# Patient Record
Sex: Female | Born: 1935 | Race: White | Hispanic: No | Smoking: Never smoker
Health system: Southern US, Community
[De-identification: ages and names within clinical notes are randomized; demographics above are authoritative.]

## PROBLEM LIST (undated history)

## (undated) DIAGNOSIS — F329 Major depressive disorder, single episode, unspecified: Principal | ICD-10-CM

## (undated) DIAGNOSIS — M810 Age-related osteoporosis without current pathological fracture: Secondary | ICD-10-CM

## (undated) DIAGNOSIS — J45909 Unspecified asthma, uncomplicated: Secondary | ICD-10-CM

## (undated) DIAGNOSIS — K8020 Calculus of gallbladder without cholecystitis without obstruction: Secondary | ICD-10-CM

## (undated) DIAGNOSIS — Z8781 Personal history of (healed) traumatic fracture: Secondary | ICD-10-CM

## (undated) DIAGNOSIS — I509 Heart failure, unspecified: Secondary | ICD-10-CM

## (undated) DIAGNOSIS — Z860100 Personal history of colon polyps, unspecified: Secondary | ICD-10-CM

## (undated) DIAGNOSIS — E785 Hyperlipidemia, unspecified: Secondary | ICD-10-CM

## (undated) DIAGNOSIS — Z8719 Personal history of other diseases of the digestive system: Secondary | ICD-10-CM

## (undated) DIAGNOSIS — I10 Essential (primary) hypertension: Secondary | ICD-10-CM

## (undated) DIAGNOSIS — A0472 Enterocolitis due to Clostridium difficile, not specified as recurrent: Secondary | ICD-10-CM

## (undated) DIAGNOSIS — N200 Calculus of kidney: Secondary | ICD-10-CM

## (undated) DIAGNOSIS — Z87442 Personal history of urinary calculi: Secondary | ICD-10-CM

## (undated) DIAGNOSIS — Q613 Polycystic kidney, unspecified: Secondary | ICD-10-CM

## (undated) DIAGNOSIS — F411 Generalized anxiety disorder: Secondary | ICD-10-CM

## (undated) DIAGNOSIS — S329XXA Fracture of unspecified parts of lumbosacral spine and pelvis, initial encounter for closed fracture: Secondary | ICD-10-CM

## (undated) DIAGNOSIS — I4821 Permanent atrial fibrillation: Secondary | ICD-10-CM

## (undated) DIAGNOSIS — I340 Nonrheumatic mitral (valve) insufficiency: Secondary | ICD-10-CM

## (undated) DIAGNOSIS — I350 Nonrheumatic aortic (valve) stenosis: Secondary | ICD-10-CM

## (undated) DIAGNOSIS — J309 Allergic rhinitis, unspecified: Secondary | ICD-10-CM

## (undated) DIAGNOSIS — M1990 Unspecified osteoarthritis, unspecified site: Secondary | ICD-10-CM

## (undated) HISTORY — DX: Generalized anxiety disorder: F41.1

## (undated) HISTORY — DX: Enterocolitis due to Clostridium difficile, not specified as recurrent: A04.72

## (undated) HISTORY — PX: APPENDECTOMY: SHX54

## (undated) HISTORY — DX: Personal history of colonic polyps: Z86.010

## (undated) HISTORY — DX: Nonrheumatic aortic (valve) stenosis: I35.0

## (undated) HISTORY — DX: Essential (primary) hypertension: I10

## (undated) HISTORY — PX: HEMICOLECTOMY: SHX854

## (undated) HISTORY — DX: Unspecified osteoarthritis, unspecified site: M19.90

## (undated) HISTORY — DX: Allergic rhinitis, unspecified: J30.9

## (undated) HISTORY — DX: Age-related osteoporosis without current pathological fracture: M81.0

## (undated) HISTORY — DX: Hyperlipidemia, unspecified: E78.5

## (undated) HISTORY — DX: Major depressive disorder, single episode, unspecified: F32.9

## (undated) HISTORY — DX: Personal history of colon polyps, unspecified: Z86.0100

## (undated) HISTORY — DX: Unspecified asthma, uncomplicated: J45.909

## (undated) HISTORY — DX: Personal history of urinary calculi: Z87.442

## (undated) HISTORY — PX: TONSILLECTOMY: SUR1361

## (undated) HISTORY — DX: Personal history of other diseases of the digestive system: Z87.19

---

## 1997-09-16 ENCOUNTER — Other Ambulatory Visit: Admission: RE | Admit: 1997-09-16 | Discharge: 1997-09-16 | Payer: Self-pay | Admitting: Obstetrics and Gynecology

## 1998-09-16 ENCOUNTER — Other Ambulatory Visit: Admission: RE | Admit: 1998-09-16 | Discharge: 1998-09-16 | Payer: Self-pay | Admitting: Obstetrics and Gynecology

## 1999-02-02 ENCOUNTER — Ambulatory Visit (HOSPITAL_COMMUNITY): Admission: RE | Admit: 1999-02-02 | Discharge: 1999-02-02 | Payer: Self-pay | Admitting: Urology

## 1999-02-02 ENCOUNTER — Encounter: Payer: Self-pay | Admitting: Urology

## 1999-02-07 ENCOUNTER — Encounter: Payer: Self-pay | Admitting: Urology

## 1999-02-09 ENCOUNTER — Encounter: Payer: Self-pay | Admitting: Urology

## 1999-02-09 ENCOUNTER — Inpatient Hospital Stay (HOSPITAL_COMMUNITY): Admission: RE | Admit: 1999-02-09 | Discharge: 1999-02-13 | Payer: Self-pay | Admitting: Urology

## 1999-02-10 ENCOUNTER — Encounter: Payer: Self-pay | Admitting: Urology

## 1999-02-11 ENCOUNTER — Encounter: Payer: Self-pay | Admitting: Urology

## 1999-02-12 ENCOUNTER — Encounter: Payer: Self-pay | Admitting: Urology

## 1999-04-21 ENCOUNTER — Ambulatory Visit (HOSPITAL_COMMUNITY): Admission: RE | Admit: 1999-04-21 | Discharge: 1999-04-21 | Payer: Self-pay | Admitting: Urology

## 1999-04-21 ENCOUNTER — Encounter: Payer: Self-pay | Admitting: Urology

## 1999-06-21 ENCOUNTER — Ambulatory Visit (HOSPITAL_BASED_OUTPATIENT_CLINIC_OR_DEPARTMENT_OTHER): Admission: RE | Admit: 1999-06-21 | Discharge: 1999-06-21 | Payer: Self-pay | Admitting: Orthopaedic Surgery

## 1999-06-21 ENCOUNTER — Encounter (INDEPENDENT_AMBULATORY_CARE_PROVIDER_SITE_OTHER): Payer: Self-pay | Admitting: *Deleted

## 1999-09-21 ENCOUNTER — Encounter: Admission: RE | Admit: 1999-09-21 | Discharge: 1999-09-21 | Payer: Self-pay | Admitting: Family Medicine

## 1999-09-21 ENCOUNTER — Encounter: Payer: Self-pay | Admitting: Family Medicine

## 1999-10-07 ENCOUNTER — Other Ambulatory Visit: Admission: RE | Admit: 1999-10-07 | Discharge: 1999-10-07 | Payer: Self-pay | Admitting: Family Medicine

## 1999-12-09 ENCOUNTER — Encounter: Payer: Self-pay | Admitting: Urology

## 1999-12-09 ENCOUNTER — Encounter: Admission: RE | Admit: 1999-12-09 | Discharge: 1999-12-09 | Payer: Self-pay | Admitting: Urology

## 2000-06-06 ENCOUNTER — Encounter: Payer: Self-pay | Admitting: Urology

## 2000-06-06 ENCOUNTER — Ambulatory Visit (HOSPITAL_COMMUNITY): Admission: RE | Admit: 2000-06-06 | Discharge: 2000-06-06 | Payer: Self-pay | Admitting: Urology

## 2000-06-07 ENCOUNTER — Ambulatory Visit (HOSPITAL_COMMUNITY): Admission: RE | Admit: 2000-06-07 | Discharge: 2000-06-07 | Payer: Self-pay | Admitting: Urology

## 2000-06-07 ENCOUNTER — Encounter: Payer: Self-pay | Admitting: Urology

## 2000-06-29 ENCOUNTER — Encounter: Admission: RE | Admit: 2000-06-29 | Discharge: 2000-06-29 | Payer: Self-pay | Admitting: Urology

## 2000-06-29 ENCOUNTER — Encounter: Payer: Self-pay | Admitting: Urology

## 2000-07-19 ENCOUNTER — Encounter (INDEPENDENT_AMBULATORY_CARE_PROVIDER_SITE_OTHER): Payer: Self-pay | Admitting: *Deleted

## 2000-07-19 ENCOUNTER — Ambulatory Visit (HOSPITAL_BASED_OUTPATIENT_CLINIC_OR_DEPARTMENT_OTHER): Admission: RE | Admit: 2000-07-19 | Discharge: 2000-07-19 | Payer: Self-pay | Admitting: Orthopaedic Surgery

## 2000-09-21 ENCOUNTER — Encounter: Admission: RE | Admit: 2000-09-21 | Discharge: 2000-09-21 | Payer: Self-pay | Admitting: *Deleted

## 2000-11-21 ENCOUNTER — Encounter: Payer: Self-pay | Admitting: Urology

## 2000-11-21 ENCOUNTER — Encounter: Admission: RE | Admit: 2000-11-21 | Discharge: 2000-11-21 | Payer: Self-pay | Admitting: Urology

## 2001-02-11 ENCOUNTER — Ambulatory Visit (HOSPITAL_COMMUNITY): Admission: RE | Admit: 2001-02-11 | Discharge: 2001-02-11 | Payer: Self-pay | Admitting: Cardiovascular Disease

## 2001-02-11 ENCOUNTER — Encounter: Payer: Self-pay | Admitting: Cardiovascular Disease

## 2001-04-01 ENCOUNTER — Encounter: Payer: Self-pay | Admitting: Urology

## 2001-04-01 ENCOUNTER — Ambulatory Visit (HOSPITAL_COMMUNITY): Admission: RE | Admit: 2001-04-01 | Discharge: 2001-04-01 | Payer: Self-pay | Admitting: Urology

## 2001-04-16 ENCOUNTER — Encounter: Admission: RE | Admit: 2001-04-16 | Discharge: 2001-04-16 | Payer: Self-pay | Admitting: Urology

## 2001-04-16 ENCOUNTER — Encounter: Payer: Self-pay | Admitting: Urology

## 2001-09-27 ENCOUNTER — Encounter: Admission: RE | Admit: 2001-09-27 | Discharge: 2001-09-27 | Payer: Self-pay | Admitting: Internal Medicine

## 2001-09-27 ENCOUNTER — Encounter: Payer: Self-pay | Admitting: Internal Medicine

## 2001-10-08 ENCOUNTER — Other Ambulatory Visit: Admission: RE | Admit: 2001-10-08 | Discharge: 2001-10-08 | Payer: Self-pay | Admitting: Obstetrics and Gynecology

## 2001-10-21 ENCOUNTER — Encounter: Payer: Self-pay | Admitting: Urology

## 2001-10-21 ENCOUNTER — Encounter: Admission: RE | Admit: 2001-10-21 | Discharge: 2001-10-21 | Payer: Self-pay | Admitting: Urology

## 2001-10-28 ENCOUNTER — Encounter: Payer: Self-pay | Admitting: Gastroenterology

## 2002-04-28 ENCOUNTER — Encounter: Payer: Self-pay | Admitting: Urology

## 2002-04-28 ENCOUNTER — Encounter: Admission: RE | Admit: 2002-04-28 | Discharge: 2002-04-28 | Payer: Self-pay | Admitting: Urology

## 2002-08-24 ENCOUNTER — Encounter: Payer: Self-pay | Admitting: Emergency Medicine

## 2002-08-24 ENCOUNTER — Emergency Department (HOSPITAL_COMMUNITY): Admission: EM | Admit: 2002-08-24 | Discharge: 2002-08-24 | Payer: Self-pay | Admitting: Emergency Medicine

## 2002-11-24 ENCOUNTER — Other Ambulatory Visit: Admission: RE | Admit: 2002-11-24 | Discharge: 2002-11-24 | Payer: Self-pay | Admitting: Obstetrics and Gynecology

## 2002-12-03 ENCOUNTER — Ambulatory Visit (HOSPITAL_COMMUNITY): Admission: RE | Admit: 2002-12-03 | Discharge: 2002-12-03 | Payer: Self-pay | Admitting: Cardiology

## 2003-06-22 ENCOUNTER — Inpatient Hospital Stay (HOSPITAL_COMMUNITY): Admission: EM | Admit: 2003-06-22 | Discharge: 2003-06-23 | Payer: Self-pay | Admitting: Urology

## 2003-06-22 ENCOUNTER — Emergency Department (HOSPITAL_COMMUNITY): Admission: EM | Admit: 2003-06-22 | Discharge: 2003-06-22 | Payer: Self-pay | Admitting: Emergency Medicine

## 2004-10-13 ENCOUNTER — Ambulatory Visit: Payer: Self-pay | Admitting: Internal Medicine

## 2004-10-15 ENCOUNTER — Ambulatory Visit: Payer: Self-pay | Admitting: Internal Medicine

## 2004-12-23 ENCOUNTER — Ambulatory Visit: Payer: Self-pay | Admitting: Internal Medicine

## 2004-12-26 ENCOUNTER — Other Ambulatory Visit: Admission: RE | Admit: 2004-12-26 | Discharge: 2004-12-26 | Payer: Self-pay | Admitting: Obstetrics and Gynecology

## 2005-01-27 ENCOUNTER — Encounter: Admission: RE | Admit: 2005-01-27 | Discharge: 2005-01-27 | Payer: Self-pay | Admitting: Orthopaedic Surgery

## 2005-02-16 ENCOUNTER — Ambulatory Visit: Payer: Self-pay | Admitting: Internal Medicine

## 2005-04-21 ENCOUNTER — Ambulatory Visit: Payer: Self-pay | Admitting: Internal Medicine

## 2005-09-04 ENCOUNTER — Ambulatory Visit: Payer: Self-pay | Admitting: Internal Medicine

## 2005-10-20 ENCOUNTER — Ambulatory Visit: Payer: Self-pay | Admitting: Internal Medicine

## 2005-11-07 ENCOUNTER — Ambulatory Visit: Payer: Self-pay | Admitting: Internal Medicine

## 2005-11-10 ENCOUNTER — Ambulatory Visit: Payer: Self-pay | Admitting: Internal Medicine

## 2006-01-09 ENCOUNTER — Ambulatory Visit: Payer: Self-pay | Admitting: Internal Medicine

## 2006-02-02 ENCOUNTER — Encounter: Admission: RE | Admit: 2006-02-02 | Discharge: 2006-02-02 | Payer: Self-pay | Admitting: Obstetrics and Gynecology

## 2006-03-30 ENCOUNTER — Ambulatory Visit: Payer: Self-pay | Admitting: Internal Medicine

## 2006-08-20 ENCOUNTER — Ambulatory Visit: Payer: Self-pay | Admitting: Internal Medicine

## 2006-08-21 DIAGNOSIS — Z8719 Personal history of other diseases of the digestive system: Secondary | ICD-10-CM | POA: Insufficient documentation

## 2006-08-21 DIAGNOSIS — I10 Essential (primary) hypertension: Secondary | ICD-10-CM | POA: Insufficient documentation

## 2006-08-21 DIAGNOSIS — M1990 Unspecified osteoarthritis, unspecified site: Secondary | ICD-10-CM | POA: Insufficient documentation

## 2006-08-21 DIAGNOSIS — F411 Generalized anxiety disorder: Secondary | ICD-10-CM | POA: Insufficient documentation

## 2006-08-21 DIAGNOSIS — E785 Hyperlipidemia, unspecified: Secondary | ICD-10-CM | POA: Insufficient documentation

## 2006-08-21 DIAGNOSIS — M810 Age-related osteoporosis without current pathological fracture: Secondary | ICD-10-CM | POA: Insufficient documentation

## 2006-08-21 DIAGNOSIS — Z87442 Personal history of urinary calculi: Secondary | ICD-10-CM | POA: Insufficient documentation

## 2006-11-13 ENCOUNTER — Ambulatory Visit: Payer: Self-pay | Admitting: Gastroenterology

## 2006-11-13 LAB — CONVERTED CEMR LAB
Basophils Absolute: 0 10*3/uL (ref 0.0–0.1)
Basophils Relative: 0.8 % (ref 0.0–1.0)
Eosinophils Absolute: 0.1 10*3/uL (ref 0.0–0.6)
Eosinophils Relative: 1.5 % (ref 0.0–5.0)
HCT: 40.1 % (ref 36.0–46.0)
Hemoglobin: 13.9 g/dL (ref 12.0–15.0)
Iron: 97 ug/dL (ref 42–145)
Lymphocytes Relative: 27.2 % (ref 12.0–46.0)
MCHC: 34.7 g/dL (ref 30.0–36.0)
MCV: 88.5 fL (ref 78.0–100.0)
Monocytes Absolute: 0.3 10*3/uL (ref 0.2–0.7)
Monocytes Relative: 5.8 % (ref 3.0–11.0)
Neutro Abs: 3.2 10*3/uL (ref 1.4–7.7)
Neutrophils Relative %: 64.7 % (ref 43.0–77.0)
Platelets: 125 10*3/uL — ABNORMAL LOW (ref 150–400)
RBC: 4.53 M/uL (ref 3.87–5.11)
RDW: 13.4 % (ref 11.5–14.6)
Saturation Ratios: 23.2 % (ref 20.0–50.0)
Transferrin: 298.7 mg/dL (ref >=212.0)
WBC: 4.9 10*3/uL (ref 4.5–10.5)

## 2006-11-23 ENCOUNTER — Ambulatory Visit: Payer: Self-pay | Admitting: Internal Medicine

## 2006-11-27 ENCOUNTER — Ambulatory Visit: Payer: Self-pay | Admitting: Gastroenterology

## 2006-11-27 LAB — CONVERTED CEMR LAB
Fecal Occult Blood: NEGATIVE
OCCULT 1: POSITIVE — AB
OCCULT 2: POSITIVE — AB
OCCULT 3: NEGATIVE
OCCULT 4: NEGATIVE
OCCULT 5: NEGATIVE

## 2007-01-25 ENCOUNTER — Encounter: Payer: Self-pay | Admitting: Internal Medicine

## 2007-01-31 ENCOUNTER — Ambulatory Visit: Payer: Self-pay | Admitting: Internal Medicine

## 2007-01-31 LAB — CONVERTED CEMR LAB
Bilirubin Urine: NEGATIVE
Glucose, Urine, Semiquant: NEGATIVE
Ketones, urine, test strip: NEGATIVE
Nitrite: NEGATIVE
Protein, U semiquant: NEGATIVE
Specific Gravity, Urine: 1.01
Urobilinogen, UA: NEGATIVE
pH: 5

## 2007-02-07 ENCOUNTER — Telehealth: Payer: Self-pay | Admitting: Internal Medicine

## 2007-02-25 ENCOUNTER — Encounter: Payer: Self-pay | Admitting: Internal Medicine

## 2007-04-16 ENCOUNTER — Encounter (INDEPENDENT_AMBULATORY_CARE_PROVIDER_SITE_OTHER): Payer: Self-pay | Admitting: *Deleted

## 2007-04-16 ENCOUNTER — Telehealth: Payer: Self-pay | Admitting: Internal Medicine

## 2007-04-18 ENCOUNTER — Telehealth (INDEPENDENT_AMBULATORY_CARE_PROVIDER_SITE_OTHER): Payer: Self-pay | Admitting: *Deleted

## 2007-07-16 ENCOUNTER — Encounter: Payer: Self-pay | Admitting: Internal Medicine

## 2007-07-25 ENCOUNTER — Telehealth (INDEPENDENT_AMBULATORY_CARE_PROVIDER_SITE_OTHER): Payer: Self-pay | Admitting: *Deleted

## 2007-08-09 ENCOUNTER — Ambulatory Visit: Payer: Self-pay | Admitting: Internal Medicine

## 2007-08-09 ENCOUNTER — Telehealth (INDEPENDENT_AMBULATORY_CARE_PROVIDER_SITE_OTHER): Payer: Self-pay | Admitting: *Deleted

## 2007-08-09 DIAGNOSIS — I4821 Permanent atrial fibrillation: Secondary | ICD-10-CM | POA: Insufficient documentation

## 2007-08-09 DIAGNOSIS — J302 Other seasonal allergic rhinitis: Secondary | ICD-10-CM | POA: Insufficient documentation

## 2007-08-09 DIAGNOSIS — J45909 Unspecified asthma, uncomplicated: Secondary | ICD-10-CM

## 2007-08-09 DIAGNOSIS — J3089 Other allergic rhinitis: Secondary | ICD-10-CM

## 2007-10-30 ENCOUNTER — Telehealth: Payer: Self-pay | Admitting: Internal Medicine

## 2007-10-31 ENCOUNTER — Ambulatory Visit: Payer: Self-pay | Admitting: Internal Medicine

## 2007-10-31 DIAGNOSIS — J069 Acute upper respiratory infection, unspecified: Secondary | ICD-10-CM | POA: Insufficient documentation

## 2007-11-06 ENCOUNTER — Ambulatory Visit: Payer: Self-pay | Admitting: Internal Medicine

## 2007-11-28 ENCOUNTER — Telehealth: Payer: Self-pay | Admitting: Internal Medicine

## 2007-11-29 ENCOUNTER — Ambulatory Visit: Payer: Self-pay | Admitting: Internal Medicine

## 2007-11-29 ENCOUNTER — Telehealth: Payer: Self-pay | Admitting: Internal Medicine

## 2008-01-21 ENCOUNTER — Encounter: Payer: Self-pay | Admitting: Internal Medicine

## 2008-03-12 ENCOUNTER — Telehealth (INDEPENDENT_AMBULATORY_CARE_PROVIDER_SITE_OTHER): Payer: Self-pay | Admitting: *Deleted

## 2008-03-16 ENCOUNTER — Ambulatory Visit: Payer: Self-pay | Admitting: Internal Medicine

## 2008-03-19 ENCOUNTER — Telehealth (INDEPENDENT_AMBULATORY_CARE_PROVIDER_SITE_OTHER): Payer: Self-pay | Admitting: *Deleted

## 2008-05-29 ENCOUNTER — Emergency Department (HOSPITAL_BASED_OUTPATIENT_CLINIC_OR_DEPARTMENT_OTHER): Admission: EM | Admit: 2008-05-29 | Discharge: 2008-05-29 | Payer: Self-pay | Admitting: Emergency Medicine

## 2008-06-12 ENCOUNTER — Encounter: Payer: Self-pay | Admitting: Internal Medicine

## 2008-07-15 DIAGNOSIS — Z860100 Personal history of colon polyps, unspecified: Secondary | ICD-10-CM | POA: Insufficient documentation

## 2008-07-16 ENCOUNTER — Ambulatory Visit: Payer: Self-pay | Admitting: Gastroenterology

## 2008-07-16 DIAGNOSIS — K625 Hemorrhage of anus and rectum: Secondary | ICD-10-CM | POA: Insufficient documentation

## 2008-07-16 DIAGNOSIS — K5730 Diverticulosis of large intestine without perforation or abscess without bleeding: Secondary | ICD-10-CM | POA: Insufficient documentation

## 2008-07-16 DIAGNOSIS — D689 Coagulation defect, unspecified: Secondary | ICD-10-CM

## 2008-07-16 DIAGNOSIS — K644 Residual hemorrhoidal skin tags: Secondary | ICD-10-CM | POA: Insufficient documentation

## 2008-07-16 LAB — CONVERTED CEMR LAB
Basophils Absolute: 0 10*3/uL (ref 0.0–0.1)
Basophils Relative: 0.6 % (ref 0.0–3.0)
Eosinophils Absolute: 0.1 10*3/uL (ref 0.0–0.7)
Eosinophils Relative: 1.7 % (ref 0.0–5.0)
Ferritin: 30.9 ng/mL (ref 10.0–291.0)
Folate: >20 ng/mL
HCT: 40.1 % (ref 36.0–46.0)
Hemoglobin: 13.8 g/dL (ref 12.0–15.0)
Iron: 73 ug/dL (ref 42–145)
Lymphocytes Relative: 31 % (ref 12.0–46.0)
MCHC: 34.4 g/dL (ref 30.0–36.0)
MCV: 90.3 fL (ref 78.0–100.0)
Monocytes Absolute: 0.5 10*3/uL (ref 0.1–1.0)
Monocytes Relative: 7.7 % (ref 3.0–12.0)
Neutro Abs: 4.3 10*3/uL (ref 1.4–7.7)
Neutrophils Relative %: 59 % (ref 43.0–77.0)
Platelets: 147 10*3/uL — ABNORMAL LOW (ref 150–400)
RBC: 4.44 M/uL (ref 3.87–5.11)
RDW: 12.4 % (ref 11.5–14.6)
Saturation Ratios: 15.8 % — ABNORMAL LOW (ref 20.0–50.0)
Transferrin: 329.5 mg/dL (ref >=212.0)
Vitamin B-12: 448 pg/mL (ref 211–911)
WBC: 7.1 10*3/uL (ref 4.5–10.5)

## 2008-07-22 ENCOUNTER — Encounter: Payer: Self-pay | Admitting: Gastroenterology

## 2008-07-22 ENCOUNTER — Ambulatory Visit: Payer: Self-pay | Admitting: Gastroenterology

## 2008-07-23 ENCOUNTER — Encounter: Payer: Self-pay | Admitting: Gastroenterology

## 2008-07-31 ENCOUNTER — Emergency Department (HOSPITAL_BASED_OUTPATIENT_CLINIC_OR_DEPARTMENT_OTHER): Admission: EM | Admit: 2008-07-31 | Discharge: 2008-07-31 | Payer: Self-pay | Admitting: Emergency Medicine

## 2008-07-31 ENCOUNTER — Ambulatory Visit: Payer: Self-pay | Admitting: Diagnostic Radiology

## 2008-08-01 ENCOUNTER — Emergency Department (HOSPITAL_COMMUNITY): Admission: EM | Admit: 2008-08-01 | Discharge: 2008-08-01 | Payer: Self-pay | Admitting: Emergency Medicine

## 2008-08-11 ENCOUNTER — Ambulatory Visit (HOSPITAL_COMMUNITY): Admission: RE | Admit: 2008-08-11 | Discharge: 2008-08-11 | Payer: Self-pay | Admitting: Urology

## 2008-09-15 ENCOUNTER — Ambulatory Visit: Payer: Self-pay | Admitting: Internal Medicine

## 2008-11-11 ENCOUNTER — Ambulatory Visit: Payer: Self-pay | Admitting: Internal Medicine

## 2008-12-18 ENCOUNTER — Ambulatory Visit: Payer: Self-pay | Admitting: Internal Medicine

## 2009-01-04 ENCOUNTER — Telehealth: Payer: Self-pay | Admitting: Internal Medicine

## 2009-01-05 ENCOUNTER — Ambulatory Visit: Payer: Self-pay | Admitting: Internal Medicine

## 2009-02-16 ENCOUNTER — Ambulatory Visit: Payer: Self-pay | Admitting: Internal Medicine

## 2009-02-19 ENCOUNTER — Ambulatory Visit: Payer: Self-pay | Admitting: Internal Medicine

## 2009-02-19 ENCOUNTER — Telehealth (INDEPENDENT_AMBULATORY_CARE_PROVIDER_SITE_OTHER): Payer: Self-pay | Admitting: *Deleted

## 2009-04-01 ENCOUNTER — Encounter: Payer: Self-pay | Admitting: Internal Medicine

## 2009-05-31 ENCOUNTER — Telehealth: Payer: Self-pay | Admitting: Internal Medicine

## 2009-05-31 ENCOUNTER — Ambulatory Visit: Payer: Self-pay | Admitting: Internal Medicine

## 2009-10-11 ENCOUNTER — Ambulatory Visit: Payer: Self-pay | Admitting: Internal Medicine

## 2009-12-20 ENCOUNTER — Ambulatory Visit: Payer: Self-pay | Admitting: Internal Medicine

## 2009-12-20 ENCOUNTER — Telehealth (INDEPENDENT_AMBULATORY_CARE_PROVIDER_SITE_OTHER): Payer: Self-pay | Admitting: *Deleted

## 2009-12-24 ENCOUNTER — Telehealth: Payer: Self-pay | Admitting: Internal Medicine

## 2010-01-14 ENCOUNTER — Ambulatory Visit: Payer: Self-pay | Admitting: Gastroenterology

## 2010-01-14 LAB — CONVERTED CEMR LAB
Basophils Absolute: 0 10*3/uL (ref 0.0–0.1)
Basophils Relative: 0.3 % (ref 0.0–3.0)
Eosinophils Absolute: 0.1 10*3/uL (ref 0.0–0.7)
Eosinophils Relative: 1.1 % (ref 0.0–5.0)
Ferritin: 42.2 ng/mL (ref 10.0–291.0)
Folate: >20 ng/mL
HCT: 42.2 % (ref 36.0–46.0)
Hemoglobin: 14.3 g/dL (ref 12.0–15.0)
Iron: 138 ug/dL (ref 42–145)
Lymphocytes Relative: 22.6 % (ref 12.0–46.0)
Lymphs Abs: 1.8 10*3/uL (ref 0.7–4.0)
MCHC: 33.8 g/dL (ref 30.0–36.0)
MCV: 90.5 fL (ref 78.0–100.0)
Monocytes Absolute: 0.5 10*3/uL (ref 0.1–1.0)
Monocytes Relative: 6.6 % (ref 3.0–12.0)
Neutro Abs: 5.6 10*3/uL (ref 1.4–7.7)
Neutrophils Relative %: 69.4 % (ref 43.0–77.0)
Platelets: 125 10*3/uL — ABNORMAL LOW (ref 150.0–400.0)
RBC: 4.66 M/uL (ref 3.87–5.11)
RDW: 16 % — ABNORMAL HIGH (ref 11.5–14.6)
Saturation Ratios: 30.7 % (ref 20.0–50.0)
Transferrin: 320.8 mg/dL (ref 212.0–360.0)
Vitamin B-12: 285 pg/mL (ref 211–911)
WBC: 8.1 10*3/uL (ref 4.5–10.5)

## 2010-04-19 ENCOUNTER — Telehealth (INDEPENDENT_AMBULATORY_CARE_PROVIDER_SITE_OTHER): Payer: Self-pay | Admitting: *Deleted

## 2010-04-19 ENCOUNTER — Ambulatory Visit: Payer: Self-pay | Admitting: Internal Medicine

## 2010-06-13 ENCOUNTER — Telehealth: Payer: Self-pay | Admitting: Internal Medicine

## 2010-06-17 ENCOUNTER — Telehealth (INDEPENDENT_AMBULATORY_CARE_PROVIDER_SITE_OTHER): Payer: Self-pay | Admitting: *Deleted

## 2010-07-01 ENCOUNTER — Telehealth (INDEPENDENT_AMBULATORY_CARE_PROVIDER_SITE_OTHER): Payer: Self-pay | Admitting: *Deleted

## 2010-07-04 ENCOUNTER — Ambulatory Visit: Payer: Self-pay | Admitting: Internal Medicine

## 2010-07-04 ENCOUNTER — Telehealth: Payer: Self-pay | Admitting: Internal Medicine

## 2010-07-04 DIAGNOSIS — J018 Other acute sinusitis: Secondary | ICD-10-CM | POA: Insufficient documentation

## 2010-07-28 ENCOUNTER — Encounter: Payer: Self-pay | Admitting: Internal Medicine

## 2010-07-28 ENCOUNTER — Ambulatory Visit: Payer: Self-pay | Admitting: Internal Medicine

## 2010-09-04 ENCOUNTER — Encounter: Payer: Self-pay | Admitting: Obstetrics and Gynecology

## 2010-09-13 NOTE — Progress Notes (Signed)
Summary: drug interaction- pharm calling  Phone Note From Pharmacy   [facility] [place] Call For: [name]  Summary of Call: kim at [facility] says that they received a flag from pt's ins. co re: drug interaction of the biaxin w/ vytorin. please advise. (they didn't show vytorin on pt's med list at [facility]). call kim, april or joe at [phone] Initial call taken by: Tivis Ringer, CNA,  Dec 24, 2009 12:58 PM  Follow-up for Phone Call        [facility]  He states that there is an interaction between vytorin and biaxin where there is potential for severe muscle breakdown.  Do you want to switch pt to something else.  Please advise, thanks! Follow-up by: Vernie Murders,  Dec 24, 2009 1:12 PM  Additional Follow-up for Phone Call Additional follow up Details #1::        Thanks- change to doxycycline 100 mg, #8  2 today then one daily Additional Follow-up by: Waymon Budge MD,  Dec 24, 2009 1:35 PM    Additional Follow-up for Phone Call Additional follow up Details #2::    pharmacy advised of the change, med list chanegd as well.   New/Updated Medications: DOXYCYCLINE HYCLATE 100 MG TABS (DOXYCYCLINE HYCLATE) 2 today then one daily Prescriptions: DOXYCYCLINE HYCLATE 100 MG TABS (DOXYCYCLINE HYCLATE) 2 today then one daily  #8 x 0   Entered by:   Carron Curie CMA   Authorized by:   Waymon Budge MD   Signed by:   Carron Curie CMA on 12/24/2009   Method used:   Telephoned to ...       [facility]  [address] [phone][facility] ([facility]ail[facility]       [address]       [place], [place]  [address]       Ph: [phone] or [phone]       Fax: [phone]   RxID:   [id number]

## 2010-09-13 NOTE — Progress Notes (Signed)
Summary: cough-give prednisone  Phone Note Call from Patient Call back at Home Phone [phone]   Caller: Patient Call For: young Summary of Call: Pt c/o wheezing, coughing since Saturday, wants to be seen today, pls advise.//[facility] Initial call taken by: Darletta Moll,  June 13, 2010 9:26 AM  Follow-up for Phone Call        wheezing, increased SOB, dry cough x3days.  denies f/c/s.  pt aware [facility] has no openings today and is unable to come [place] d/t daughter having surgery.  pt states that the qvar has not helped her symptoms, for which i explained to the patient that qvar is not meant to be used only as needed but as an everyday maintainence med.  please advise, thanks!  [facility]  ALLERGIES:  1)  ! * Pneumonia Vaccine 2)  ! Prednisone 3)  Penicillin G Potassium (Penicillin G Potassium) 4)  Lipitor (Atorvastatin Calcium) 5)  Sulfa 6)  Morphine Sulfate (Morphine Sulfate) Follow-up by: Boone Master CNA/MA,  June 13, 2010 10:34 AM  Additional Follow-up for Phone Call Additional follow up Details #1::        prednisone 10 mg, # 20 1 tab four times daily x 2 days, 3 times daily x 2 days, 2 times daily x 2 days, 1 time daily x 2 days  Additional Follow-up by: Waymon Budge MD,  June 13, 2010 12:35 PM    Additional Follow-up for Phone Call Additional follow up Details #2::    LMOMTCB regarding Prednisone rx. Abigail Miyamoto RN  June 13, 2010 12:48 PM   pt advised. Carron Curie CMA  June 13, 2010 2:23 PM   New/Updated Medications: PREDNISONE 10 MG TABS (PREDNISONE) 1 tab 4 times a day for 2 days, 1 tab 3 times a day for 2 days, 1 tab 2 times a day, 1 tab 1 time a day for 2 days Prescriptions: PREDNISONE 10 MG TABS (PREDNISONE) 1 tab 4 times a day for 2 days, 1 tab 3 times a day for 2 days, 1 tab 2 times a day, 1 tab 1 time a day for 2 days  #20 x 0   Entered by:   Abigail Miyamoto RN   Authorized by:   Waymon Budge MD   Signed by:   Abigail Miyamoto RN on 06/13/2010   Method used:   Electronically to        [facility]  [address] [phone][facility] (retail)       [address] [place], [place]  [address]       Ph: [phone] or [phone]       Fax: [phone]   RxID:   [id number]

## 2010-09-13 NOTE — Progress Notes (Signed)
Summary: appt  with CY----wheezing/sob  Phone Note Call from Patient Call back at Home Phone [phone]   Caller: Patient Call For: young Reason for Call: Talk to Nurse Summary of Call: pt wheezing day and night, sob - would like to come in for a breathing treatment and a shot.   Initial call taken by: Eugene Gavia,  April 19, 2010 8:19 AM  Follow-up for Phone Call        called and spoke with pt. pt c/o increased wheezing and sob esp at night.  pt requests to be seen today.  pt scheduled to see CY today at 1:30pm.  Arman Filter LPN  April 19, 2010 9:04 AM

## 2010-09-13 NOTE — Progress Notes (Signed)
Summary: speak to nurse  Phone Note Call from Patient Call back at Home Phone [phone]   Caller: Patient Call For: Joanna Salas Summary of Call: Saw CY on 5/9 coughing up green mucous, and still coughing continously, pls advise. [facility] Initial call taken by: Darletta Moll,  Dec 24, 2009 8:40 AM  Follow-up for Phone Call        Spoke with pt.  She was seen on 5/9 for cough and given depo 80 and neb tx.  She states that the cough is no better.  Cough is prod with alot of green sputum. She wants to know if she needs to come back in or get abx called in.  Please advise, thanks allergic to morphine, lipitor, pcn, and sulfa Follow-up by: Vernie Murders,  Dec 24, 2009 8:44 AM  Additional Follow-up for Phone Call Additional follow up Details #1::        Suggest biaxin 500 mg, # 14, 1 two times a day after meals Additional Follow-up by: Waymon Budge MD,  Dec 24, 2009 12:07 PM    Additional Follow-up for Phone Call Additional follow up Details #2::    pt advised. Carron Curie CMA  Dec 24, 2009 12:18 PM   New/Updated Medications: BIAXIN 500 MG TABS (CLARITHROMYCIN) 1 two times a day after meals Prescriptions: BIAXIN 500 MG TABS (CLARITHROMYCIN) 1 two times a day after meals  #14 x 0   Entered by:   Carron Curie CMA   Authorized by:   Waymon Budge MD   Signed by:   Carron Curie CMA on 12/24/2009   Method used:   [facility]        [facility]  [address] [phone]* (retail)       [address] [place], [place]  [address]       Ph: [phone] or [phone]       Fax: [phone]   RxID:   [id number]

## 2010-09-13 NOTE — Progress Notes (Signed)
Summary: cough and congestion  Phone Note Call from Patient   Caller: Patient Call For: young Summary of Call: pt coughing with congestion. would like to be seen today. [facility] Initial call taken by: Rickard Patience,  July 04, 2010 9:06 AM  Follow-up for Phone Call        Spoke withpt and she is c/o still having chest congestion and cough that is worse at night. Pt was given a pred taper on 06-13-10, and then given doxycycline on 07-01-10 but states no improvement and is requesting an OV. Spoke with Florentina Addison and ok to offer 11:15 appt today. Pt aware. I advised this is a work in slot so there might be a slight wait. pt ok with this. Carron Curie CMA  July 04, 2010 9:48 AM

## 2010-09-13 NOTE — Assessment & Plan Note (Signed)
Summary: still congested//[facility]   Copy to:  na Primary Provider/Referring Provider:  Derrek Gu, MD   CC:  Acute visit-still having lots of congestion-nasal drainage with blood in it.Marland Kitchen  History of Present Illness: October 11, 2009- Asthma, rhinitis Acute visit for increasing wheeze. This began after she worked in yard most of day a week ago. Denies sore throat, fever, chest pain. She has only a rescue inhaler. Daughter she wasn't using it right- teaching on MDI technique done.  Dec 20, 2009- Asthma, rhinitis She has been wheezing since she mowed without wearing a mask last week. Denies fever or sore throat. She says prednisone may bother her coumadin, but  it may actually have been an antibiotic that caused problem in past. She is out of rescue inhaler.  April 19, 2010- Asthma, rhinitis We refilled her Proair last visit. She used it only twice since last here. She mowed and stirred up dust, but had begun wheezing the night before. She didn't wear a mask to mow. Today  less wheeze but noticed with exertion.  July 04, 2010- Asthma, rhinitis, AFib Nurse-CC: Acute visit-still having lots of congestion-nasal drainage with blood in it. Acute visit. We called in doxy on 11/18/1. She thinks her problem is sinusitis. Went to [facility] yesterday. CXR there "no pneumonia". They gave albuterol nebw/ little help.  Head congestion, chest congestion, much cough-productive green. No f/c/s.  Cough till retch, but not nauseated. Says prednisone didn't help 4 weeks ago.  Worried about daughter w/ breast cancer.   Asthma History    Asthma Control Assessment:    Age range: 12+ years    Symptoms: >2 days/week    Nighttime Awakenings: 0-2/month    Interferes w/ normal activity: no limitations    SABA use (not for EIB): >2 days/week    Asthma Control Assessment: Not Well Controlled   Preventive Screening-Counseling & Management  Alcohol-Tobacco     Smoking Status: never  Current  Medications (verified): 1)  Verapamil Hcl Cr 240 Mg  Cp24 (Verapamil Hcl) .Marland Kitchen.. 1 By Mouth Once Daily 2)  Evista 60 Mg  Tabs (Raloxifene Hcl) .Marland Kitchen.. 1 By Mouth Once Daily 3)  Coumadin   Tabs (Warfarin Sodium Tabs) .... Per [facility] 4)  Metoprolol Tartrate 50 Mg Tabs (Metoprolol Tartrate) .... Take 1 Tablet By Mouth Two Times A Day 5)  Valturna 300-320 Mg Tabs (Aliskiren-Valsartan) .... Take 1 By Mouth Once Daily 6)  Alprazolam 0.25 Mg Tabs (Alprazolam) .... As Needed 7)  Analpram-Hc 1-2.5 %  Crea (Hydrocortisone Ace-Pramoxine) .... Use 2-3 Time Daily As Needed 8)  Florastor 250 Mg Caps (Saccharomyces Boulardii) .... Take As Needed When On Antiobiotics 9)  Proair Hfa 108 (90 Base) Mcg/act Aers (Albuterol Sulfate) .... 2 Puffs Four Times A Day As Needed Rescue Inhaler. 10)  Qvar 80 Mcg/act Aers (Beclomethasone Dipropionate) .... 2 Puffs and Rinse Mouth, Once Every Day Maintenance Steroid Inhaler 11)  Doxycycline Hyclate 100 Mg Caps (Doxycycline Hyclate) .... 2 Today, Then 1 Once Daily Until Gone  Allergies (verified): 1)  ! * Pneumonia Vaccine 2)  ! Prednisone 3)  Penicillin G Potassium (Penicillin G Potassium) 4)  Lipitor (Atorvastatin Calcium) 5)  Sulfa 6)  Morphine Sulfate (Morphine Sulfate)  Review of Systems      See HPI       The patient complains of shortness of breath with activity, productive cough, irregular heartbeats, and nasal congestion/difficulty breathing through nose.  The patient denies shortness of breath at rest, non-productive cough,  coughing up blood, chest pain, indigestion, loss of appetite, weight change, abdominal pain, difficulty swallowing, sore throat, tooth/dental problems, sneezing, ear ache, and rash.    Vital Signs:  Patient profile:   77 year old female Height:      67 inches Weight:      169 pounds BMI:     26.56 O2 Sat:      92 % on Room air Pulse rate:   95 / minute BP sitting:   140 / 80  (left arm) Cuff size:   regular  Vitals Entered By:  Reynaldo Minium CMA (July 04, 2010 10:58 AM)  O2 Flow:  Room air CC: Acute visit-still having lots of congestion-nasal drainage with blood in it.   Physical Exam  Additional Exam:  General: A/Ox3; pleasant and cooperative, NAD, alert elderly woman SKIN: no rash, lesions NODES: no lymphadenopathy HEENT: [place]/AT, EOM- WNL, Conjuctivae- subconjunctival hemorrhage right,   TM-WNL, Nose- turbinate edema, Throat- clear and wnl, Mallampati  II NECK: Supple w/ fair ROM, JVD- none, normal carotid impulses w/o bruits Thyroid-  CHEST: Slight l wheeze, upper zones with deep breath. HEART: IRR c/w atrial fib, no m/g/r heard ABDOMEN: soft HQI:ONGE, nl pulses, trace  edema  NEURO: Grossly intact to observation      Impression & Recommendations:  Problem # 1:  RHINOSINUSITIS, ACUTE (ICD-461.8)  We will have her finish the doxy, give a standby script for Avelox Neb neo nasal and depo Her updated medication list for this problem includes:    Doxycycline Hyclate 100 Mg Caps (Doxycycline hyclate) .Marland Kitchen... 2 today, then 1 once daily until gone    Avelox Abc Pack 400 Mg Tabs (Moxifloxacin hcl) .Marland Kitchen... 1 daily    Hydromet 5-1.5 Mg/6ml Syrp (Hydrocodone-homatropine) .Marland Kitchen... 1 teaspoon four times a day as needed cough  Problem # 2:  ASTHMA (ICD-493.90) Exacerbation. We reviewed her meds with her and discussed probable triggers- mainly viral infections at this time of year.  Medications Added to Medication List This Visit: 1)  Avelox Abc Pack 400 Mg Tabs (Moxifloxacin hcl) .Marland Kitchen.. 1 daily 2)  Hydromet 5-1.5 Mg/67ml Syrp (Hydrocodone-homatropine) .Marland Kitchen.. 1 teaspoon four times a day as needed cough  Other Orders: Est. Patient Level III (95284) Nebulizer Tx (13244) Depo- Medrol 80mg  (J1040) Admin of Therapeutic Inj  intramuscular or subcutaneous (01027)  Patient Instructions: 1)  Keep scheduled appointment- earlier if needed 2)  Finsih the doxycycline antibiotic 3)  If needed, you can then take the Avelox  antibiotic 4)  neb neo nasal 5)  depo 80 6)  Mucinex may help you cough the mucus out 7)  script for hydromet cough syrup Prescriptions: HYDROMET 5-1.5 MG/5ML SYRP (HYDROCODONE-HOMATROPINE) 1 teaspoon four times a day as needed cough  #150 ml x 0   Entered and Authorized by:   Waymon Budge MD   Signed by:   Waymon Budge MD on 07/04/2010   Method used:   Print then Give to Patient   RxID:   [id number] AVELOX ABC PACK 400 MG TABS (MOXIFLOXACIN HCL) 1 daily  #1 pak x 0   Entered and Authorized by:   Waymon Budge MD   Signed by:   Waymon Budge MD on 07/04/2010   Method used:   Print then Give to Patient   RxID:   [id number]      Medication Administration  Injection # 1:    Medication: Depo- Medrol 80mg     Diagnosis: RHINOSINUSITIS, ACUTE (ICD-461.8)    Route: IM  Site: RUOQ gluteus    Exp Date: 08/03/2010    Lot #: [id number]    Mfr: Teva    Patient tolerated injection without complications    Given by: Renold Genta RCP, LPN (July 04, 2010 12:11 PM)  Orders Added: 1)  Est. Patient Level III [04540] 2)  Nebulizer Tx [98119] 3)  Depo- Medrol 80mg  [J1040] 4)  Admin of Therapeutic Inj  intramuscular or subcutaneous [14782]

## 2010-09-13 NOTE — Progress Notes (Signed)
Summary: wheezing/ congestion  Phone Note Call from Patient   Caller: Patient Call For: young Summary of Call: green congestion wheezing pt would like breathing treatment and a shot [facility] Initial call taken by: Rickard Patience,  Dec 20, 2009 8:30 AM  Follow-up for Phone Call        Pt c/o wheezing, productive cough with green phlegm that is worse at night, and chest soreness when she coughs x 3 days. Pt is requesting an appt to be seen today so she can get a neb treatment and shot. Pelase advise.Carron Curie CMA  Dec 20, 2009 8:55 AM allergies: )  ! * Pneumonia Vaccine 2)  ! Prednisone 3)  Penicillin G Potassium (Penicillin G Potassium) 4)  Lipitor (Atorvastatin Calcium) 5)  Sulfa 6)  Morphine Sulfate (Morphine Sulfate)   Additional Follow-up for Phone Call Additional follow up Details #1::        Spoke with pt; coming in to see [facility] at 1130am today.Reynaldo Minium CMA  Dec 20, 2009 9:07 AM

## 2010-09-13 NOTE — Assessment & Plan Note (Signed)
Summary: asthma/jd   Primary Provider/Referring Provider:  Dr Derrek Gu  CC:  Acute Visit. c/o wheezing and increased SOB with activity x4days.  denies chest tightness. Marland Kitchen  History of Present Illness: 01/05/09-- Astrhma, rhintis Persistent sinus and chest congestion. Several weeks ago started with nasal soreness, pressure, right frontal headache. Still some wheeze after taking prednisone taper- made her feel out of sorts. She feels she needs depo shot. Mucus is now clear. Sinus discomfort is improving.  February 16, 2009--Presents for acute office visit. Complains of allergies: runny nose, wheezing, prod cough with yellowish mucus x2days. --Rx Amox x 7 days and zyrtec  February 19, 2009--Presents today because still having symptoms. slight better in congesion but still coughing.   more sinus drainage causing the prod cough with yellow tinted mucus and having more wheezing and headaches.  still taking antibiotic-would like depo shot. Denies chest pain, orthopnea, hemoptysis, fever, n/v/d, edema, headache.   May 31, 2009-Asthma, rhinitis Complains of increased asthma/ wheezing x 2 days, mostly dry cough. Denies fever or sore throat. She says prednisone "thinned her blood" but did help wheezing at last acute visit. Nasal congestion at night.  October 11, 2009- Asthma, rhinitis Acute visit for increasing wheeze. This began after she worked in yard most of day a week ago. Denies sore throat, fever, chest pain. She has only a rescue inhaler. Daughter she wasn't using it right- teaching on MDI technique done.   Current Medications (verified): 1)  Vytorin 10-20 Mg  Tabs (Ezetimibe-Simvastatin) .Marland Kitchen.. 1 By Mouth At Bedtime 2)  Verapamil Hcl Cr 240 Mg  Cp24 (Verapamil Hcl) .Marland Kitchen.. 1 By Mouth Once Daily 3)  Evista 60 Mg  Tabs (Raloxifene Hcl) .Marland Kitchen.. 1 By Mouth Once Daily 4)  Coumadin   Tabs (Warfarin Sodium Tabs) .... Per [facility] 5)  Metoprolol Tartrate 50 Mg Tabs (Metoprolol Tartrate) .... Take 1  Tablet By Mouth Two Times A Day 6)  Astepro 137 Mcg/spray Soln (Azelastine Hcl) .Marland Kitchen.. 1-2 Sprays Each Nostril Up To Twice Daily If Needed 7)  Valturna 300-320 Mg Tabs (Aliskiren-Valsartan) .... Take 1 By Mouth Once Daily 8)  Alprazolam 0.25 Mg Tabs (Alprazolam) .... As Needed  Allergies (verified): 1)  ! * Pneumonia Vaccine 2)  ! Prednisone 3)  Penicillin G Potassium (Penicillin G Potassium) 4)  Lipitor (Atorvastatin Calcium) 5)  Sulfa 6)  Morphine Sulfate (Morphine Sulfate)  Past History:  Past Medical History: Last updated: 09/15/2008  COLONIC POLYPS, HX OF (ICD-V12.72) UPPER RESPIRATORY INFECTION (ICD-465.9) ATRIAL FIBRILLATION (ICD-427.31) ALLERGIC RHINITIS (ICD-477.9) ASTHMA (ICD-493.90) HIATAL HERNIA, HX OF (ICD-V12.79) UROLITHIASIS, HX OF (ICD-V13.01) OSTEOPOROSIS (ICD-733.00) OSTEOARTHRITIS (ICD-715.90) HYPERTENSION (ICD-401.9) HYPERLIPIDEMIA (ICD-272.4) ANXIETY (ICD-300.00)  Past Surgical History: Last updated: 07/15/2008 Appendectomy Tonsillectomy Right Hemicolectomy  Family History: Last updated: 03/16/2008 Mother- dec'd at age 47 - heart Father - dec'd at age 57 - heart  Social History: Last updated: 07/15/2008 Patient never smoked.  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  The patient denies anorexia, fever, weight loss, weight gain, vision loss, decreased hearing, hoarseness, chest pain, syncope, peripheral edema, headaches, hemoptysis, abdominal pain, and severe indigestion/heartburn.    Vital Signs:  Patient profile:   77 year old female Height:      66.5 inches Weight:      176.13 pounds BMI:  28.10 O2 Sat:      95 % on Room air Pulse rate:   77 / minute BP sitting:   132 / 86  (left arm) Cuff size:   regular  Vitals Entered By: Gweneth Dimitri RN (October 11, 2009 10:55 AM)  O2 Flow:  Room air CC: Acute Visit. c/o wheezing and increased SOB with activity x4days.  denies chest tightness.  Comments Medications reviewed with patient Daytime contact number verified with patient. Gweneth Dimitri RN  October 11, 2009 10:55 AM    Physical Exam  Additional Exam:  General: A/Ox3; pleasant and cooperative, NAD, alert elderly woman SKIN: no rash, lesions NODES: no lymphadenopathy HEENT: [place]/AT, EOM- WNL, Conjuctivae- subconjunctival hemorrhage right,   TM-WNL, Nose- turbinate edema, Throat- clear and wnl, Mallampati  II NECK: Supple w/ fair ROM, JVD- none, normal carotid impulses w/o bruits Thyroid-  CHEST: Coarse BS w/ few faint exp wheezing.  HEART: IRR c/w atrial fib, no m/g/r heard ABDOMEN: soft ZOX:WRUE, nl pulses, no edema  NEURO: Grossly intact to observation      Impression & Recommendations:  Problem # 1:  ASTHMA (ICD-493.90) Recent exacerbation of Asthma. This was due to allergy and yard dust exposure. She knows now to wear her mask when outdoors. We are spending an extra 10 minutes on metered inhaler technique. For today will give neb xop and depo.  Medications Added to Medication List This Visit: 1)  Alprazolam 0.25 Mg Tabs (Alprazolam) .... As needed  Other Orders: Est. Patient Level III (45409) Depo- Medrol 80mg  (J1040) Admin of Therapeutic Inj  intramuscular or subcutaneous (81191) Nebulizer Tx (47829)  Patient Instructions: 1)  Please schedule a follow-up appointment in 6 months. 2)  neb xop 1.25 3)  depo 80 4)  ...................................................................................................... 5)  metered inhaler teaching for her rescue inhaler: 2 puffs, up to 4 x daily as needed.   Immunization History:  Pneumovax Immunization History:    Pneumovax:  historical (01/12/2006)    Medication Administration  Injection # 1:    Medication: Depo- Medrol 80mg     Diagnosis: ASTHMA (ICD-493.90)     Route: IM    Site: LUOQ gluteus    Exp Date: 05/2010    Lot #: [id number]    Mfr: Teva    Patient tolerated injection without complications    Given by: Gweneth Dimitri RN (October 11, 2009 11:45 AM)  Orders Added: 1)  Est. Patient Level III [57846] 2)  Depo- Medrol 80mg  [J1040] 3)  Admin of Therapeutic Inj  intramuscular or subcutaneous [96372] 4)  Nebulizer Tx [96295]

## 2010-09-13 NOTE — Progress Notes (Signed)
Summary: still sick  Phone Note Call from Patient Call back at Home Phone [phone]   Caller: Patient Call For: young Reason for Call: Talk to Nurse Summary of Call: wheezing, increased SOB, dry cough and was prescribed prednisone which she started Monday.  She is calling today saying she is not better asking for breathing treatment. Initial call taken by: Lehman Prom,  June 17, 2010 2:44 PM  Follow-up for Phone Call        called spoke with patient who states that her wheezing, dry cough and dyspnea have not improved with the pred taper - is currently taking 1 tab two times a day of the pred taper.  states the wheezing was worse this morning but has since calmed.  please advise, thanks!  ALLERGIES:  1)  ! * Pneumonia Vaccine 2)  ! Prednisone 3)  Penicillin G Potassium (Penicillin G Potassium) 4)  Lipitor (Atorvastatin Calcium) 5)  Sulfa 6)  Morphine Sulfate (Morphine Sulfate) Follow-up by: Boone Master CNA/MA,  June 17, 2010 4:08 PM  Additional Follow-up for Phone Call Additional follow up Details #1::        Per [facility]-too late to be seen here so pt should go to ER or UC to be seen. Spoke with pt-had granddaughter who work for [employer] to show her how to use the Qvar inhaler again-since taking the inhaler she has felt better. Will go to UC if needed.Reynaldo Minium CMA  June 17, 2010 5:31 PM

## 2010-09-13 NOTE — Progress Notes (Signed)
Summary: congestion- give doxycycline  Phone Note Call from Patient Call back at Home Phone [phone]   Caller: Patient Call For: young Reason for Call: Talk to Nurse Summary of Call: Patient calling with c/o  wheezing, increased SOB, and congestion.  She just finished prednisone.  Now she feels like congestion has moved to chest and is asking for abx.  [facility]. Initial call taken by: Lehman Prom,  July 01, 2010 10:33 AM  Follow-up for Phone Call        Spoke with pt.  She c/o runny nose- clear nasal d/c.  She states that her chest feels tight- thinks that congestion is moved to her chest.  Denies cough.  Would like something called in.  Pls advise thanks!  Allergies (verified):  1)  ! * Pneumonia Vaccine 2)  ! Prednisone 3)  Penicillin G Potassium (Penicillin G Potassium) 4)  Lipitor (Atorvastatin Calcium) 5)  Sulfa 6)  Morphine Sulfate (Morphine Sulfate)  Follow-up by: Vernie Murders,  July 01, 2010 10:39 AM  Additional Follow-up for Phone Call Additional follow up Details #1::        Offer doxycycline 100 mg, # 8, no ref 2 today then one daily Additional Follow-up by: Waymon Budge MD,  July 01, 2010 10:54 AM    Additional Follow-up for Phone Call Additional follow up Details #2::    Spoke with pt and notified of recs per CDY.  Pt okay with this plan and rx was sent to pharm. Follow-up by: Vernie Murders,  July 01, 2010 11:00 AM  New/Updated Medications: DOXYCYCLINE HYCLATE 100 MG CAPS (DOXYCYCLINE HYCLATE) 2 today, then 1 once daily until gone Prescriptions: DOXYCYCLINE HYCLATE 100 MG CAPS (DOXYCYCLINE HYCLATE) 2 today, then 1 once daily until gone  #8 x 0   Entered by:   Vernie Murders   Authorized by:   Waymon Budge MD   Signed by:   Vernie Murders on 07/01/2010   Method used:   [facility]  [address] [phone][facility]ail[facility]       [address] [place], [place]  [address]       Ph: [phone] or  [phone]       Fax: [phone]   RxID:   [id number]

## 2010-09-13 NOTE — Assessment & Plan Note (Signed)
Summary: wheezing/increased sob/mg   Copy to:  na Primary Provider/Referring Provider:  Derrek Gu, MD   CC:  Accute visit-increased wheezing and dry cough and SOB.Marland Kitchen  History of Present Illness: May 31, 2009-Asthma, rhinitis Complains of increased asthma/ wheezing x 2 days, mostly dry cough. Denies fever or sore throat. She says prednisone "thinned her blood" but did help wheezing at last acute visit. Nasal congestion at night.  October 11, 2009- Asthma, rhinitis Acute visit for increasing wheeze. This began after she worked in yard most of day a week ago. Denies sore throat, fever, chest pain. She has only a rescue inhaler. Daughter she wasn't using it right- teaching on MDI technique done.  Dec 20, 2009- Asthma, rhinitis She has been wheezing since she mowed without wearing a mask last week. Denies fever or sore throat. She says prednisone may bother her coumadin, but  it may actually have been an antibiotic that caused problem in past. She is out of rescue inhaler.  April 19, 2010- Asthma, rhinitis We refilled her Proair last visit. She used it only twice since last here. She mowed and stirred up dust, but had begun wheezing the night before. She didn't wear a mask to mow. Today  less wheeze but noticed with exertion.   Asthma History    Initial Asthma Severity Rating:    Age range: 12+ years    Symptoms: 0-2 days/week    Nighttime Awakenings: 0-2/month    Interferes w/ normal activity: no limitations    SABA use (not for EIB): 0-2 days/week    Asthma Severity Assessment: Intermittent   Preventive Screening-Counseling & Management  Alcohol-Tobacco     Smoking Status: never  Current Medications (verified): 1)  Verapamil Hcl Cr 240 Mg  Cp24 (Verapamil Hcl) .Marland Kitchen.. 1 By Mouth Once Daily 2)  Evista 60 Mg  Tabs (Raloxifene Hcl) .Marland Kitchen.. 1 By Mouth Once Daily 3)  Coumadin   Tabs (Warfarin Sodium Tabs) .... Per [facility] 4)  Metoprolol Tartrate 50 Mg Tabs (Metoprolol  Tartrate) .... Take 1 Tablet By Mouth Two Times A Day 5)  Valturna 300-320 Mg Tabs (Aliskiren-Valsartan) .... Take 1 By Mouth Once Daily 6)  Alprazolam 0.25 Mg Tabs (Alprazolam) .... As Needed 7)  Proair Hfa 108 (90 Base) Mcg/act Aers (Albuterol Sulfate) .... 2 Puffs Four Times A Day As Needed Rescue Inhaler. 8)  Analpram-Hc 1-2.5 %  Crea (Hydrocortisone Ace-Pramoxine) .... Use 2-3 Time Daily As Needed 9)  Florastor 250 Mg Caps (Saccharomyces Boulardii) .... Take As Needed When On Antiobiotics  Allergies (verified): 1)  ! * Pneumonia Vaccine 2)  ! Prednisone 3)  Penicillin G Potassium (Penicillin G Potassium) 4)  Lipitor (Atorvastatin Calcium) 5)  Sulfa 6)  Morphine Sulfate (Morphine Sulfate)  Past History:  Past Medical History: Last updated: 01/14/2010 COLONIC POLYPS, HX OF (ICD-V12.72) UPPER RESPIRATORY INFECTION (ICD-465.9) ATRIAL FIBRILLATION (ICD-427.31) ALLERGIC RHINITIS (ICD-477.9) ASTHMA (ICD-493.90) HIATAL HERNIA, HX OF (ICD-V12.79) UROLITHIASIS, HX OF (ICD-V13.01) OSTEOPOROSIS (ICD-733.00) OSTEOARTHRITIS (ICD-715.90) HYPERTENSION (ICD-401.9) HYPERLIPIDEMIA (ICD-272.4) ANXIETY (ICD-300.00)  Past Surgical History: Last updated: 07/15/2008 Appendectomy Tonsillectomy Right Hemicolectomy  Family History: Last updated: 04/19/2010 Mother- dec'd at age 58 - heart Father - dec'd at age 53 - heart No FH of Colon Cancer: 1 son died in MVA  Social History: Last updated: 04/19/2010 Patient never smoked.  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  The patient denies shortness of breath at rest, productive cough, non-productive cough, coughing up blood, chest pain, irregular heartbeats, acid heartburn, indigestion, loss of appetite, weight change, abdominal pain, difficulty swallowing, sore throat, tooth/dental problems, headaches, nasal congestion/difficulty breathing through nose, and sneezing.    Vital Signs:  Patient profile:   77 year old female Height:      66.5 inches Weight:      174.13 pounds BMI:     27.78 O2 Sat:      97 % on Room air Pulse rate:   63 / minute BP sitting:   114 / 68  (left arm) Cuff size:   regular  Vitals Entered By: Reynaldo Minium CMA (April 19, 2010 1:38 PM)  O2 Flow:  Room air CC: Accute visit-increased wheezing, dry cough and SOB.   Physical Exam  Additional Exam:  General: A/Ox3; pleasant and cooperative, NAD, alert elderly woman SKIN: no rash, lesions NODES: no lymphadenopathy HEENT: [place]/AT, EOM- WNL, Conjuctivae- subconjunctival hemorrhage right,   TM-WNL, Nose- turbinate edema, Throat- clear and wnl, Mallampati  II NECK: Supple w/ fair ROM, JVD- none, normal carotid impulses w/o bruits Thyroid-  CHEST: Dry cough and minimal wheeze with deep breath. HEART: IRR c/w atrial fib, no m/g/r heard ABDOMEN: soft NWG:NFAO, nl pulses, no edema  NEURO: Grossly intact to observation      Impression & Recommendations:  Problem # 1:  ASTHMA (ICD-493.90) She has exacerbation with wheeze after dust or grass mowing, every few months. It may be difficult to keep her on a maintenance steroid inhaler long enough to assess whether it is cost-effective. We will try adding a steroid inhaer.  Problem # 2:  ALLERGIC RHINITIS (ICD-477.9) She will be sensitive to stimulant bronchodilators and has failed cardioversion twice.  Medications Added to Medication List This Visit: 1)  Qvar 80 Mcg/act Aers (Beclomethasone dipropionate) .... 2 puffs and  rinse mouth, once every day maintenance steroid inhaler  Other Orders: Admin of Therapeutic Inj  intramuscular or subcutaneous (13086) Depo- Medrol 80mg  (J1040) Nebulizer Tx (57846)  Patient Instructions: 1)  Please schedule a follow-up appointment in 3 months. 2)  Qvar 80: 2 puffs and rinse mouth once daily, every day, for prevention of asthma. 3)  neb xop 1.25 4)  depo 80 Prescriptions: QVAR 80 MCG/ACT AERS (BECLOMETHASONE DIPROPIONATE) 2 puffs and rinse mouth, once every day maintenance steroid inhaler  #1 x prn   Entered and Authorized by:   Waymon Budge MD   Signed by:   Waymon Budge MD on 04/19/2010   Method used:   Print then Give to Patient   RxID:   [id number]      Medication Administration  Injection # 1:    Medication: Depo- Medrol 80mg     Diagnosis: ASTHMA (ICD-493.90)    Route: SQ    Site: RUOQ gluteus    Exp Date: 11/2012    Lot #: [id number]    Mfr: Pharmacia    Patient tolerated injection without complications    Given by: Reynaldo Minium CMA (April 19, 2010 2:13 PM)  Medication #  1:    Medication: Xopenex 1.25mg     Diagnosis: ASTHMA (ICD-493.90)    Dose: 1 vial    Route: inhaled    Exp Date: 04/2010    Lot #: [id number]    Mfr: Sepracor    Patient tolerated medication without complications    Given by: Reynaldo Minium CMA (April 19, 2010 2:14 PM)  Orders Added: 1)  Admin of Therapeutic Inj  intramuscular or subcutaneous [96372] 2)  Depo- Medrol 80mg  [J1040] 3)  Nebulizer Tx [21308]

## 2010-09-13 NOTE — Assessment & Plan Note (Signed)
Summary: FISSURE IN RECTUM/YF    History of Present Illness Visit Type: Follow-up Visit Primary GI MD: Sheryn Bison MD FACP FAGA Primary Provider: Derrek Gu, MD  Requesting Provider: na Chief Complaint: anal fissures History of Present Illness:   77 year old Caucasian female recently treated with broad-spectrum antibiotics for urinary tract infection. This resushelted in diarrhea and some perirectal irritation which seems to be resolving at this time. She's had no rectal bleeding of significance or abdominal pain. She is more than 30 years status post right hemicolectomy for colon carcinoma, has a history of multiple recurrent adenomas with last colonoscopy 2 years ago. She is anticoagulated chronically on Coumadin for her cardiovascular problems and atrial fibrillation. She also has had surgery for kidney stones, and has had appendectomy.She has yearly gynecologic exams with Dr. Richarda Overlie.   GI Review of Systems      Denies abdominal pain, acid reflux, belching, bloating, chest pain, dysphagia with liquids, dysphagia with solids, heartburn, loss of appetite, nausea, vomiting, vomiting blood, weight loss, and  weight gain.      Reports anal fissur.     Denies black tarry stools, change in bowel habit, constipation, diarrhea, diverticulosis, fecal incontinence, heme positive stool, hemorrhoids, irritable bowel syndrome, jaundice, light color stool, liver problems, rectal bleeding, and  rectal pain.    Current Medications (verified): 1)  Vytorin 10-20 Mg  Tabs (Ezetimibe-Simvastatin) .Marland Kitchen.. 1 By Mouth At Bedtime 2)  Verapamil Hcl Cr 240 Mg  Cp24 (Verapamil Hcl) .Marland Kitchen.. 1 By Mouth Once Daily 3)  Evista 60 Mg  Tabs (Raloxifene Hcl) .Marland Kitchen.. 1 By Mouth Once Daily 4)  Coumadin   Tabs (Warfarin Sodium Tabs) .... Per Coumadin Clinic 5)  Metoprolol Tartrate 50 Mg Tabs (Metoprolol Tartrate) .... Take 1 Tablet By Mouth Two Times A Day 6)  Valturna 300-320 Mg Tabs (Aliskiren-Valsartan) .... Take  1 By Mouth Once Daily 7)  Alprazolam 0.25 Mg Tabs (Alprazolam) .... As Needed 8)  Proair Hfa 108 (90 Base) Mcg/act Aers (Albuterol Sulfate) .... 2 Puffs Four Times A Day As Needed Rescue Inhaler.  Allergies (verified): 1)  ! * Pneumonia Vaccine 2)  ! Prednisone 3)  Penicillin G Potassium (Penicillin G Potassium) 4)  Lipitor (Atorvastatin Calcium) 5)  Sulfa 6)  Morphine Sulfate (Morphine Sulfate)  Past History:  Past medical, surgical, family and social histories (including risk factors) reviewed for relevance to current acute and chronic problems.  Past Medical History: COLONIC POLYPS, HX OF (ICD-V12.72) UPPER RESPIRATORY INFECTION (ICD-465.9) ATRIAL FIBRILLATION (ICD-427.31) ALLERGIC RHINITIS (ICD-477.9) ASTHMA (ICD-493.90) HIATAL HERNIA, HX OF (ICD-V12.79) UROLITHIASIS, HX OF (ICD-V13.01) OSTEOPOROSIS (ICD-733.00) OSTEOARTHRITIS (ICD-715.90) HYPERTENSION (ICD-401.9) HYPERLIPIDEMIA (ICD-272.4) ANXIETY (ICD-300.00)  Past Surgical History: Reviewed history from 07/15/2008 and no changes required. Appendectomy Tonsillectomy Right Hemicolectomy  Family History: Reviewed history from 03/16/2008 and no changes required. Mother- dec'd at age 33 - heart Father - dec'd at age 42 - heart No FH of Colon Cancer:  Social History: Reviewed history from 07/15/2008 and no changes required. Patient never smoked.  Married with children Occupation: Retired Armed forces technical officer  Review of Systems  The patient denies allergy/sinus, anemia, anxiety-new, arthritis/joint pain, back pain, blood in urine, breast changes/lumps, change in vision, confusion, cough, coughing up blood, depression-new, fainting, fatigue, fever, headaches-new, hearing problems, heart murmur, heart rhythm changes, itching, menstrual pain, muscle pains/cramps, night sweats, nosebleeds, pregnancy symptoms, shortness of breath, skin rash, sleeping problems, sore throat, swelling of feet/legs, swollen lymph glands, thirst  - excessive , urination - excessive , urination changes/pain, urine leakage,  vision changes, and voice change.    Vital Signs:  Patient profile:   77 year old female Height:      66.5 inches Weight:      164 pounds BMI:     26.17 BSA:     1.85 Pulse rate:   88 / minute Pulse rhythm:   regular BP sitting:   136 / 80  (left arm) Cuff size:   regular  Vitals Entered By: Ok Anis CMA (January 14, 2010 10:36 AM)  Physical Exam  General:  Well developed, well nourished, no acute distress.healthy appearing.  healthy appearing.   Head:  Normocephalic and atraumatic. Eyes:  PERRLA, no icterus. Abdomen:  Soft, nontender and nondistended. No masses, hepatosplenomegaly or hernias noted. Normal bowel sounds.Some deformity of the abdominal wall with right lower quadrant herniation noted. There are no masses or tenderness however and bowel sounds are normal. Rectal:  Mild perianal irritation without nodularity or excoriations. There is a small posterior tag noted. Rectal exam shows no masses or tenderness with soft stool present which is guaiac negative. Extremities:  No clubbing, cyanosis, edema or deformities noted. Skin:  redness and petechiae.  Easy bruisability with ecchymoses on the extremities noted related to Coumadin use. Psych:  Alert and cooperative. Normal mood and affect.   Impression & Recommendations:  Problem # 1:  RECTAL BLEEDING (ICD-569.3) Assessment Improved Post antibiotic-induced diarrhea has resolved, and this caused some mild perianal irritation. I cannot appreciate an active fissure or fistulae  or swollen external hemorrhoids. I have advised local  Analmantle cream 2-3 times a day as needed per her perianal irritation. In the future, with antibiotics she would be a good candidate for Florstar prophylactic probiotic therapy. She is up-to-date on her colonoscopy exams. I did send her by the lab to check CBC and anemia profile. Orders: TLB-B12, Serum-Total ONLY  (16109-U04) TLB-Ferritin (82728-FER) TLB-Folic Acid (Folate) (82746-FOL) TLB-IBC Pnl (Iron/FE;Transferrin) (83550-IBC) TLB-CBC Platelet - w/Differential (85025-CBCD)  Problem # 2:  DIVERTICULOSIS, COLON (ICD-562.10) Assessment: Improved Continue high-fiber diet as tolerated. Orders: TLB-B12, Serum-Total ONLY (54098-J19) TLB-Ferritin (82728-FER) TLB-Folic Acid (Folate) (82746-FOL) TLB-IBC Pnl (Iron/FE;Transferrin) (83550-IBC) TLB-CBC Platelet - w/Differential (85025-CBCD)  Problem # 3:  COAGULOPATHY (ICD-286.9) Assessment: Unchanged She is to speak with Dr. York Ram in cardiology about her Coumadin and perhaps alternative anti-coagulation because of her multiple ecchymoses. She seems stable from a cardiovascular standpoint. Blood pressure today is 136/80 and pulse was 88 and slightly irregular. She does have a history of chronic atrial fibrillation.  Patient Instructions: 1)  A prescription will be sent to your pharmacy for ananmantle cream to use as needed. 2)  Begin Florastor if you take any antiobiotics. 3)  Please go to the basement for labs. 4)  The medication list was reviewed and reconciled.  All changed / newly prescribed medications were explained.  A complete medication list was provided to the patient / caregiver. 5)  Copy sent to : Dr. Derrek Gu, Dr. Nanetta Batty, and Dr. Richarda Overlie 6)  Colonoscopy followup as per clinical protocol. 7)  Please continue current medications.   Appended Document: FISSURE IN RECTUM/YF    Clinical Lists Changes  Medications: Added new medication of ANALPRAM-HC 1-2.5 %  CREA (HYDROCORTISONE ACE-PRAMOXINE) Use 2-3 time daily as needed - Signed Added new medication of FLORASTOR 250 MG CAPS (SACCHAROMYCES BOULARDII) take as needed when on antiobiotics Rx of ANALPRAM-HC 1-2.5 %  CREA (HYDROCORTISONE ACE-PRAMOXINE) Use 2-3 time daily as needed;  #35 gm x 3;  Signed;  Entered by: Ashok Cordia RN;  Authorized by: Mardella Layman MD [facility];  Method [phone] [address], [place], [place], [place]  [address], Ph: [phone] or [phone], Fax: [phone]    Prescriptions: ANALPRAM-HC 1-2.5 %  CREA (HYDROCORTISONE ACE-PRAMOXINE) Use 2-3 time daily as needed  #35 gm x 3   Entered by:   Ashok Cordia RN   Authorized by:   Mardella Layman MD [facility]   Signed by:   Ashok Cordia RN on 01/14/2010   Method used:   [facility]        [facility]  [address] [phone][facility]ail[facility]       [address] [place], [place]  [address]       Ph: [phone] or [phone]       Fax: [phone]   RxID:   [id number]

## 2010-09-13 NOTE — Assessment & Plan Note (Signed)
Summary: accute visit-congestion,wheezing-depo and tx request/kcw   Primary Provider/Referring Provider:  Dr Derrek Gu  CC:  Accute visit-cough(dry) and wheezing.Marland Kitchen  History of Present Illness: February 19, 2009--Presents today because still having symptoms. slight better in congesion but still coughing.   more sinus drainage causing the prod cough with yellow tinted mucus and having more wheezing and headaches.  still taking antibiotic-would like depo shot. Denies chest pain, orthopnea, hemoptysis, fever, n/v/d, edema, headache.   May 31, 2009-Asthma, rhinitis Complains of increased asthma/ wheezing x 2 days, mostly dry cough. Denies fever or sore throat. She says prednisone "thinned her blood" but did help wheezing at last acute visit. Nasal congestion at night.  October 11, 2009- Asthma, rhinitis Acute visit for increasing wheeze. This began after she worked in yard most of day a week ago. Denies sore throat, fever, chest pain. She has only a rescue inhaler. Daughter she wasn't using it right- teaching on MDI technique done.  Dec 20, 2009- Asthma, rhinitis She has been wheezing since she mowed without wearing a mask last week. Denies fever or sore throat. She says prednisone may bother her coumadin, but  it may actually have been an antibiotic that caused problem in past. She is out of rescue inhaler.    Current Medications (verified): 1)  Vytorin 10-20 Mg  Tabs (Ezetimibe-Simvastatin) .Marland Kitchen.. 1 By Mouth At Bedtime 2)  Verapamil Hcl Cr 240 Mg  Cp24 (Verapamil Hcl) .Marland Kitchen.. 1 By Mouth Once Daily 3)  Evista 60 Mg  Tabs (Raloxifene Hcl) .Marland Kitchen.. 1 By Mouth Once Daily 4)  Coumadin   Tabs (Warfarin Sodium Tabs) .... Per Coumadin Clinic 5)  Metoprolol Tartrate 50 Mg Tabs (Metoprolol Tartrate) .... Take 1 Tablet By Mouth Two Times A Day 6)  Astepro 137 Mcg/spray Soln (Azelastine Hcl) .Marland Kitchen.. 1-2 Sprays Each Nostril Up To Twice Daily If Needed 7)  Valturna 300-320 Mg Tabs (Aliskiren-Valsartan) ....  Take 1 By Mouth Once Daily 8)  Alprazolam 0.25 Mg Tabs (Alprazolam) .... As Needed  Allergies (verified): 1)  ! * Pneumonia Vaccine 2)  ! Prednisone 3)  Penicillin G Potassium (Penicillin G Potassium) 4)  Lipitor (Atorvastatin Calcium) 5)  Sulfa 6)  Morphine Sulfate (Morphine Sulfate)  Past History:  Past Medical History: Last updated: 09/15/2008  COLONIC POLYPS, HX OF (ICD-V12.72) UPPER RESPIRATORY INFECTION (ICD-465.9) ATRIAL FIBRILLATION (ICD-427.31) ALLERGIC RHINITIS (ICD-477.9) ASTHMA (ICD-493.90) HIATAL HERNIA, HX OF (ICD-V12.79) UROLITHIASIS, HX OF (ICD-V13.01) OSTEOPOROSIS (ICD-733.00) OSTEOARTHRITIS (ICD-715.90) HYPERTENSION (ICD-401.9) HYPERLIPIDEMIA (ICD-272.4) ANXIETY (ICD-300.00)  Past Surgical History: Last updated: 07/15/2008 Appendectomy Tonsillectomy Right Hemicolectomy  Family History: Last updated: 03/16/2008 Mother- dec'd at age 56 - heart Father - dec'd at age 28 - heart  Social History: Last updated: 07/15/2008 Patient never smoked.  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  The patient denies anorexia, fever, weight loss, weight gain, vision loss, decreased hearing, hoarseness, chest pain, syncope, peripheral edema, headaches, hemoptysis, abdominal pain, melena, and severe indigestion/heartburn.    Vital Signs:  Patient profile:   77 year old female Height:      66.5 inches Weight:      174 pounds BMI:     27.76 O2 Sat:      95 % on Room air Pulse rate:   82 / minute Cuff size:   regular  Vitals Entered By: Reynaldo Minium CMA (Dec 20, 2009 11:27 AM)  O2 Flow:  Room air  Physical Exam  Additional Exam:  General: A/Ox3; pleasant and cooperative, NAD, alert elderly woman SKIN: no rash, lesions NODES: no lymphadenopathy HEENT: [place]/AT, EOM- WNL, Conjuctivae-  subconjunctival hemorrhage right,   TM-WNL, Nose- turbinate edema, Throat- clear and wnl, Mallampati  II NECK: Supple w/ fair ROM, JVD- none, normal carotid impulses w/o bruits Thyroid-  CHEST: Dry cough and mild wheeze.  HEART: IRR c/w atrial fib, no m/g/r heard ABDOMEN: soft ZOX:WRUE, nl pulses, no edema  NEURO: Grossly intact to observation      Impression & Recommendations:  Problem # 1:  ASTHMA (ICD-493.90) Exacerbation of asthma. I favor allergic over viral infection. We discussed her rescue inhaler and will give neb and depo.   Medications Added to Medication List This Visit: 1)  Proair Hfa 108 (90 Base) Mcg/act Aers (Albuterol sulfate) .... 2 puffs four times a day as needed rescue inhaler.  Other Orders: Est. Patient Level III (45[address]) Admin of Therapeutic Inj  intramuscular or subcutaneous (81191) Depo- Medrol 80mg  (J1040) Nebulizer Tx (47829)  Patient Instructions: 1)  Keep appointnment in August 2)  Neb xop 1.25 3)  depo 80 4)  Sample and script for Proair rescue inhaler; 2 puffs four times a day as needed  Prescriptions: PROAIR HFA 108 (90 BASE) MCG/ACT AERS (ALBUTEROL SULFATE) 2 puffs four times a day as needed rescue inhaler.  #1 x prn   Entered and Authorized by:   Waymon Budge MD   Signed by:   Waymon Budge MD on 12/20/2009   Method used:   Print then Give to Patient   RxID:   [id number]      Medication Administration  Injection # 1:    Medication: Depo- Medrol 80mg     Diagnosis: ASTHMA (ICD-493.90)    Route: SQ    Site: LUOQ gluteus    Exp Date: 06/2012    Lot #: [id number]    Mfr: Pharmacia    Patient tolerated injection without complications    Given by: Reynaldo Minium CMA (Dec 20, 2009 12:01 PM)  Medication # 1:    Medication: Xopenex 1.25mg     Diagnosis: ASTHMA (ICD-493.90)    Dose: 1 vial    Route: inhaled    Exp Date: 04/2010    Lot #: [id number]    Mfr: Sepracor    Patient tolerated medication without complications    Given  by: Boone Master, CNA  Orders Added: 1)  Est. Patient Level III [24401] 2)  Admin of Therapeutic Inj  intramuscular or subcutaneous [96372] 3)  Depo- Medrol 80mg  [J1040] 4)  Nebulizer Tx [02725]

## 2010-09-15 ENCOUNTER — Ambulatory Visit (INDEPENDENT_AMBULATORY_CARE_PROVIDER_SITE_OTHER): Payer: Medicare Other | Admitting: Internal Medicine

## 2010-09-15 ENCOUNTER — Encounter: Payer: Self-pay | Admitting: Internal Medicine

## 2010-09-15 DIAGNOSIS — J45909 Unspecified asthma, uncomplicated: Secondary | ICD-10-CM

## 2010-09-15 DIAGNOSIS — J309 Allergic rhinitis, unspecified: Secondary | ICD-10-CM

## 2010-09-15 NOTE — Assessment & Plan Note (Signed)
Summary: rov 3 months///kp  Pt not seen today due to running late in appt times; [facility] will [facility].Joanna Salas CMA  July 28, 2010 3:44 PM       CC:  3 month follow up visit.  Allergies: 1)  ! * Pneumonia Vaccine 2)  ! Prednisone 3)  Penicillin G Potassium (Penicillin G Potassium) 4)  Lipitor (Atorvastatin Calcium) 5)  Sulfa 6)  Morphine Sulfate (Morphine Sulfate)   Other Orders: No Charge Patient Arrived (NCPA0) (NCPA0)

## 2010-09-21 NOTE — Assessment & Plan Note (Signed)
Summary: per pt call/wheezing/sob/mhh   Vital Signs:  Patient profile:   77 year old female Height:      67 inches Weight:      174.13 pounds BMI:     27.37 O2 Sat:      93 % on Room air Pulse rate:   83 / minute BP sitting:   138 / 80  (left arm) Cuff size:   regular  Vitals Entered By: Reynaldo Minium CMA (September 15, 2010 10:10 AM)  O2 Flow:  Room air CC: Acute visit-asthma flare up-wheezing, worse in late evening and early mornings, nasal drainage-clear.   Copy to:  na Primary Provider/Referring Provider:  Derrek Gu, MD   CC:  Acute visit-asthma flare up-wheezing, worse in late evening and early mornings, and nasal drainage-clear.Marland Kitchen  History of Present Illness: April 19, 2010- Asthma, rhinitis We refilled her Proair last visit. She used it only twice since last here. She mowed and stirred up dust, but had begun wheezing the night before. She didn't wear a mask to mow. Today  less wheeze but noticed with exertion.  July 04, 2010- Asthma, rhinitis, AFib Nurse-CC: Acute visit-still having lots of congestion-nasal drainage with blood in it. Acute visit. We called in doxy on 11/18/1. She thinks her problem is sinusitis. Went to [facility] yesterday. CXR there "no pneumonia". They gave albuterol nebw/ little help.  Head congestion, chest congestion, much cough-productive green. No f/c/s.  Cough till retch, but not nauseated. Says prednisone didn't help 4 weeks ago.  Worried about daughter w/ breast cancer.  September 15, 2010- Asthma, rhinitis, AFib Nurse-CC: Acute visit-asthma flare up-wheezing, worse in late evening and early mornings, nasal drainage-clear. She asks about stress of her daughter's breast cancer therapy on her own asthma. Denies cold or infection, but when stressed she feels wheezey. Nasal drainage is clear. she only has Qvar and was using it only as a rescue inhaler. Education done.   Asthma History    Asthma Control Assessment:    Age range: 12+  years    Symptoms: 0-2 days/week    Nighttime Awakenings: 0-2/month    Interferes w/ normal activity: no limitations    Asthma Control Assessment: Well Controlled   Preventive Screening-Counseling & Management  Alcohol-Tobacco     Smoking Status: never  Current Medications (verified): 1)  Verapamil Hcl Cr 240 Mg  Cp24 (Verapamil Hcl) .Marland Kitchen.. 1 By Mouth Once Daily 2)  Evista 60 Mg  Tabs (Raloxifene Hcl) .Marland Kitchen.. 1 By Mouth Once Daily 3)  Coumadin   Tabs (Warfarin Sodium Tabs) .... Per [facility] 4)  Metoprolol Tartrate 50 Mg Tabs (Metoprolol Tartrate) .... Take 1 Tablet By Mouth Two Times A Day 5)  Valturna 300-320 Mg Tabs (Aliskiren-Valsartan) .... Take 1 By Mouth Once Daily 6)  Alprazolam 0.25 Mg Tabs (Alprazolam) .... As Needed 7)  Proair Hfa 108 (90 Base) Mcg/act Aers (Albuterol Sulfate) .... 2 Puffs Four Times A Day As Needed Rescue Inhaler. 8)  Qvar 80 Mcg/act Aers (Beclomethasone Dipropionate) .... 2 Puffs and Rinse Mouth, Once Every Day Maintenance Steroid Inhaler  Allergies (verified): 1)  ! * Pneumonia Vaccine 2)  ! Prednisone 3)  Penicillin G Potassium (Penicillin G Potassium) 4)  Lipitor (Atorvastatin Calcium) 5)  Sulfa 6)  Morphine Sulfate (Morphine Sulfate)  Past History:  Past Medical History: Last updated: 01/14/2010 COLONIC POLYPS, HX OF (ICD-V12.72) UPPER RESPIRATORY INFECTION (ICD-465.9) ATRIAL FIBRILLATION (ICD-427.31) ALLERGIC RHINITIS (ICD-477.9) ASTHMA (ICD-493.90) HIATAL HERNIA, HX OF (ICD-V12.79) UROLITHIASIS, HX OF (  ICD-V13.01) OSTEOPOROSIS (ICD-733.00) OSTEOARTHRITIS (ICD-715.90) HYPERTENSION (ICD-401.9) HYPERLIPIDEMIA (ICD-272.4) ANXIETY (ICD-300.00)  Past Surgical History: Last updated: 07/15/2008 Appendectomy Tonsillectomy Right Hemicolectomy  Family History: Last updated: 04/19/2010 Mother- dec'd at age 89 - heart Father - dec'd at age 51 - heart No FH of Colon Cancer: 1 son died in MVA  Social History: Last updated:  04/19/2010 Patient never smoked.  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  The patient denies shortness of breath with activity, shortness of breath at rest, productive cough, non-productive cough, coughing up blood, chest pain, irregular heartbeats, acid heartburn, indigestion, loss of appetite, weight change, abdominal pain, difficulty swallowing, sore throat, tooth/dental problems, headaches, and sneezing.    Physical Exam  Additional Exam:  General: A/Ox3; pleasant and cooperative, NAD, alert elderly woman SKIN: no rash, lesions NODES: no lymphadenopathy HEENT: [place]/AT, EOM- WNL, Conjuctivae- subconjunctival hemorrhage right,   TM-WNL, Nose- turbinate edema, Throat- clear and wnl, dentures, Mallampati  II NECK: Supple w/ fair ROM, JVD- none, normal carotid impulses w/o bruits Thyroid-  CHEST: No wheeze, unlabored HEART: IRR c/w atrial fib, no m/g/r heard ABDOMEN: soft GMW:NUUV, nl pulses, trace  edema  NEURO: Grossly intact to observation      Impression & Recommendations:  Problem # 1:  ASTHMA (ICD-493.90) We discussed role of stress. The main problem is she had gotten off track on meds and needed re-education about rescue vs maintenance meds. We will prescribe rescue inhaler and review Qvar.   Problem # 2:  ALLERGIC RHINITIS (ICD-477.9) Other than occasional stuffy nose, this has been an unusually good winter. We discussed expectations as pollen season begins.   Other Orders: Est. Patient Level III (25366)  Patient Instructions: 1)  Please schedule a follow-up appointment in 6 months. 2)  Use the Qvar 80 steroid maintenance contoller inhaler 3)      2 puffs and rinse mouth once every day. Get into a regular habit, like at bedtime, so you can remember  it. 4)  Script printed for Proair albuterol rescue inhaler 5)     2 puffs, up to 4 times daily, if needed for wheeze or chest tightness.  Prescriptions: PROAIR HFA 108 (90 BASE) MCG/ACT AERS (ALBUTEROL SULFATE) 2 puffs four times a day as needed rescue inhaler.  #1 x prn   Entered and Authorized by:   Waymon Budge MD   Signed by:   Waymon Budge MD on 09/15/2010   Method used:   Print then Give to Patient   RxID:   [id number]    Orders Added: 1)  Est. Patient Level III [64332]

## 2010-09-23 ENCOUNTER — Encounter: Payer: Self-pay | Admitting: Internal Medicine

## 2010-10-20 NOTE — Letter (Signed)
Summary: [facility]   Imported By: Maryln Gottron 10/11/2010 15:57:42  _____________________________________________________________________  External Attachment:    Type:   Image     Comment:   External Document

## 2010-12-30 NOTE — Assessment & Plan Note (Signed)
[facility]                             PULMONARY OFFICE NOTE   NAME:Joanna Salas, Joanna Salas                     MRN:          [id number]  DATE:11/23/2006                            DOB:          06-10-1925    PROBLEM:  1. Asthma.  2. Allergic rhinitis.  3. Atrial fibrillation/Coumadin.  4. Kidney stones.   HISTORY:  One week of nasal congestion and wheezing, pretty clearly  triggered by mowing the lawn, and wheezing is bothering her more now  when she tries to lie down at night.  Clear mucous.  Zyrtec makes her  sleep so that she does not like to use it.  She almost never uses her  albuterol inhaler despite this wheezing.   MEDICATIONS:  1. Vytorin 20 mg.  2. Betapace 120 mg b.i.d.  3. Evista 60 mg.  4. Coumadin.  5. Zyrtec.  6. Verapamil 240 mg.  7. Diovan 320 mg.  8. Alprazolam 0.25 mg p.r.n.  9. Albuterol inhaler is available.   DRUG INTOLERANCE:  SULFA WITH NAUSEA.  MORPHINE.   OBJECTIVE:  Weight 171 pounds, BP 136/78, pulse regular 98, room air  saturation 96%.  Mild crackles.  Rhythm is slightly irregular, I do not hear a murmur.  There is no neck vein distention or edema.  Nasal mucosa is quite wet but not obstructed.   IMPRESSION:  Exacerbation of asthma and rhinitis.  Atrial fibrillation  is controlled.   PLAN:  1. Try taking Zyrtec at bedtime since she already has some, but we      discussed use of Claritin as an alternative.  2. Xopenex neb treatment 1.25 mg.  3. Depo-Medrol 80 mg IM.  4. Medication choices, including her albuterol, were discussed.  Will      watch to see how she does through this episode, and for now we are      scheduling return in 1 year, but earlier p.r.n.     Clinton D. Maple Hudson, MD, Tonny Bollman, FACP  Electronically Signed    CDY/MedQ  DD: 11/24/2006  DT: 11/24/2006  Job #: [id number]

## 2010-12-30 NOTE — Assessment & Plan Note (Signed)
[facility]                         GASTROENTEROLOGY OFFICE NOTE   JANNEY, PRIEGO                     MRN:          [id number]  DATE:11/13/2006                            DOB:          01-16-1925    PRIMARY CARE PHYSICIAN:  Pamella Pert, MD, [facility].   Joanna Salas is a 77 year old white female with new-onset atrial fibrillation  requiring anticoagulation.  She has a past history of colon polyps and  had a right hemicolectomy some 40 years ago for what appears to be a  benign polyp.  Her last colonoscopy in March of 2003 was unremarkable  without any evidence of polyps, as was colonoscopy in January of 1999.  She has had previous problems with a colonoscopy prep and had to be  admitted 1 time because of dehydration from nausea and vomiting.   She received a recall letter for colonoscopy, but denies any GI  complaints.  She is having minor gas and bloating with ingestion of gas-  inducing foods, but otherwise does not have any GI symptoms what so  ever.  Her appetite is good and her weight is stable.  She has no upper  GI or hepatobiliary complaints.   As mentioned above, her past history is complicated by atrial  fibrillation and asthmatic bronchitis.  She is anticoagulated.  She has  had some problems with recurrent kidney stones.   FAMILY HISTORY:  Noncontributory in terms of GI problems.  Both of her  parents did suffer from atherosclerotic cardiovascular disease.   SOCIAL HISTORY:  She is married and lives with her husband.  She has a  10th grade education, retired from [employer].  She does not smoke or use  ethanol.   REVIEW OF SYSTEMS:  Noncontributory.   MEDICATIONS:  1. Vytorin 20 mg a day.  2. Betapace 120 mg twice a day.  3. Evista 60 mg a day.  4. Coumadin as directed.  5. Zyrtec p.r.n.  6. Verapamil 240 mg a day.  7. Diovan 320 mg a day.  8. P.r.n. alprazolam use.   She has a history of reaction in the past to SULFA and  MORPHINE.   EXAMINATION:  She is a healthy-appearing elderly white female in no  acute distress, appearing her stated age.  She is 5 feet 7 inches tall and weighs 172 pounds.  Blood pressure is  135/80 and pulse is 84 and regular.  I could not appreciate stigmata of chronic liver disease.  CHEST:  Clear.  She appeared to be in irregular rhythm without significant murmurs,  gallops, or rubs.  I could not appreciate hepatosplenomegaly, abdominal masses, or  tenderness.  Bowel sounds were normal.  There is a fine petechial rash over the dorsum of both legs, but  otherwise no edema, phlebitis, or swollen joints.  Mental status was  clear.   ASSESSMENT:  Joanna Salas is at high risk for colonoscopy both in terms  of the procedure and its prep.  She has had 2 negative colonoscopies in  the least 10 years and is age 47, to be 77 years old in August.  I think  the risk of colonoscopy in this setting is greater than any benefit  unless indications are more urgent.   RECOMMENDATIONS:  1. Check CBC and iron studies.  2. Outpatient Hemoccult cards.  3. Cautious observation unless symptoms change or laboratory data      suggests need for colonoscopy at this time.     Vania Rea. Jarold Motto, MD, Caleen Essex, FAGA  Electronically Signed    DRP/MedQ  DD: 11/13/2006  DT: 11/13/2006  Job #: [id number]   cc:   Pamella Pert, MD, [facility]  Willow Ora, MD

## 2010-12-30 NOTE — Assessment & Plan Note (Signed)
[facility]                                 ON-CALL NOTE   RESHANDA, LEWEY                       MRN:          [id number]  DATE:02/02/2007                            DOB:          November 22, 1924    Patient of Dr. Drue Novel.  She called at 8:35 a.m. on February 02, 2007  complaining of a bad reaction to Augmentin.  She has been having  diarrhea since she started it on Thursday for urinary tract infection.  The patient states she is allergic to sulfa and morphine.  She is on  several medications for her heart.  I did look the patient up in EMR to  look at her medications, and changed the Augmentin to Macrobid twice a  day for 7 days.  A urine culture did come back with multiple organisms,  and the patient should follow up with Dr. Drue Novel if her symptoms do not  improve.     Lelon Perla, DO  Electronically Signed    Shawnie Dapper  DD: 02/02/2007  DT: 02/02/2007  Job #: [id number]   cc:   Willow Ora, MD

## 2010-12-30 NOTE — Op Note (Signed)
NAME:  Joanna Salas, Joanna Salas                        ACCOUNT NO.:  [id number]   MEDICAL RECORD NO.:  [id number]                   PATIENT TYPE:  AMB   LOCATION:  ENDO                                 FACILITY:  [facility]   PHYSICIAN:  Vonna Kotyk R. Jacinto Halim, M.D.                  DATE OF BIRTH:  1924/09/07   DATE OF PROCEDURE:  12/03/2002  DATE OF DISCHARGE:                                 OPERATIVE REPORT   PROCEDURE PERFORMED:  1. Transesophageal echocardiogram.  2. Electrosynchronized electrical cardioversion.   REFERRING PHYSICIAN:  Richard A. Alanda Amass, M.D.   INDICATIONS FOR PROCEDURE:  The patient is a 77 year old female with history  of hypertension, history of mitral regurgitation who has chronic atrial  fibrillation.  A transesophageal echocardiogram is being evaluated for the  presence of left atrial appendage clot and for possible electrical  cardioversion.  Of note, the patient has had difficulty in maintaining INR  with Coumadin and the hope was to maintain sinus rhythm, hopefully to be  able to take her off of Coumadin.   DESCRIPTION OF PROCEDURE:  On transesophageal echocardiogram under my  sedation using Versed, a local anesthetic spray, a [employer] Omni-  Plane probe was easily introduced into the esophagus and transesophageal  echocardiogram was performed.   DATA:  Left atrium:  The left atrium shows marked left enlargement.  The  left atrial appendage was well visualized.  It was very small.  The left  atrial appendage velocities were 10 to 15 mmHg.   Left ventricle:  The left ventricle was normal.  There was normal segmental  wall motion.   Right atrium:  The right atrium was normal.   Right ventricle:  The right ventricle was normal.   Pericardium:  The pericardium was normal.   Mitral valve:  The mitral valve shows mild mitral annular calcification.  There is mild nonspecific thickening of both anterior and posterior mitral  leaflets.  There is moderate  eccentric posteriorly directed mitral valve  regurgitation.  There is moderate eccentric posteriorly directed mitral  valve regurgitation.  There was reversal of flow noted only in the right  upper pulmonary vein but not in any of the other veins.  Although this meets  criteria for severe mitral regurgitation, echocardiographically it appears  to be at most moderately severe.   Tricuspid valve:  Tricuspid valve is normal.  There is mild at most mild to  moderate tricuspid regurgitation.  The pulmonary artery systolic pressure  was estimated around 33 to 35 mmHg.   Pulmonary valve:  Pulmonary valve is normal.  There is mild pulmonary valve  regurgitation.   Aortic valve:  Aortic valve showed mild focal thickening of noncoronary  cusp.  There is mild calcification.  There is mild aortic regurgitation.  There is aortic valve stenosis.  Repeat gradient across the aortic valve was  25 mmHg.   Interatrial septum:  Interatrial septum was  intact with no evidence of PFO.   Thoracic aorta:  The thoracic aorta showed mild calcification.  There was no  complex plaque noted.   FINAL INTERPRETATION:  1. Normal left ventricular systolic function, ejection fraction 60%.  2. Marked left enlargement.  3. Moderate to at most moderately severe probably moderate mitral valve     regurgitation.  Eccentric posteriorly directed jet.  4. Mild to at most mild to moderate tricuspid valve regurgitation with mild     pulmonary hypertension.  Pulmonary artery systolic pressure around 35     mmHg.  5. Markedly left enlargement.   RECOMMENDATIONS:  Based on the transesophageal echocardiographic findings  would proceed with electrical cardioversion.   DESCRIPTION OF PROCEDURE:  Technique of electrical cardioversion with the  help of department of anesthesia using Pentothal for general anesthesia.  Patient was deeply sedated and synchronized direct current cardioversion was  performed using bifid defibrillator  50 joules and then  at 150 joules times  two.  The patient converted from atrial fibrillation to normal sinus rhythm.  The patient tolerated the procedure well and remained hemodynamically stable  before and after procedure.                                               Cristy Hilts. Jacinto Halim, M.D.    Pilar Plate  D:  12/03/2002  T:  12/04/2002  Job:  [id number]   cc:   Gerlene Burdock A. Alanda Amass, M.D.  [phone] [address], [address]  [place]  [place] [address]  Fax: [phone]

## 2010-12-30 NOTE — Op Note (Signed)
[facility]  Patient:    Joanna Salas, Joanna Salas Visit Number: [id number] MRN: [id number]          Service Type: DSU Location: [facility] Attending Physician:  Randolm Idol Proc. 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  OPERATION:  Excisional biopsy - mass right thumb.  SURGEON:  Claude Manges. Cleophas Dunker, M.D.  ANESTHESIA:  IV Xylocaine.  COMPLICATIONS:  None.  HISTORY:  This 77 year old female was approximately one year status post mass excision from her right thumb by another physician.  She has noted a recurrence of a mass and was told that it did not need to be removed, but it has become large enough that it interferes with her activities and thus she visits the office for evaluation.  The mass is approximately 1 cm long and located on the palmar surface and ulnar side of the thumb.  Sensory is intact.  DESCRIPTION OF PROCEDURE:  The patient comfortable on the operating table, IV Xylocaine was administered by Dr. Gelene Mink.  The right upper extremity was then prepped from the tips of the fingers to the mid forearm, where the tourniquet was applied with Duraprep.  Sterile draping was performed.  A longitudinal incision was made along the mid ulnar plane of the thumb and then curved radially at the PIP joint flexion crease by sharp dissection, the incision was carried down to the subcutaneous tissue.  Then, by blunt dissection, the flap was elevated so that I could visualize the mass.  The sensory nerves were identified and carefully retracted bluntly out of the operative field.  The mass was yellowish in nature, it appears that it was probably a xanthoma.  I was able to retrieve most of it en bloc.  There were several small pieces  of the tumor still remaining that were attached to the flexor tendon sheath.  These were carefully excised removing a small window of tendon sheath.  I could not visualize any further abnormal tissue.  At this point, the wound was irrigated with saline solution, I closed the skin with interrupted 4-0 Ethilon.  A sterile bulky dressing was applied, followed by a Coban.  The tourniquet was subsequently deflated.  There was immediate capillary refill to the finger.  PLAN:  Darvocet for pain, office one week.  We will send the specimen for cytology. Attending Physician:  Randolm Idol DD:  07/19/00 TD:  07/19/00 Job: [id number]

## 2010-12-30 NOTE — Op Note (Signed)
. [facility]  Patient:    Joanna Salas                        MRN: [id number] Proc. Date: 06/21/99 Adm. Date:  [id number] Attending:  Marcene Corning                           Operative Report  PREOPERATIVE DIAGNOSIS:  Right thumb mass.  POSTOPERATIVE DIAGNOSIS:  Right thumb mass.  OPERATION:  Excision, right thumb mass.  ATTENDING SURGEON:  Lubertha Basque. Jerl Santos, M.D.  ASSISTANT:  Prince Rome, P.A.  ANESTHESIA:  Bier block.  INDICATIONS FOR PROCEDURE:  The patient is a 77 year old woman with a long history of a bump on the palmar aspect of her right thumb.  This has become problematic as it made it difficult for her to grasp things with this her dominant thumb.  She was offered excision.  The procedure was discussed with the patient and informed operative consent was obtained after discussion of the possible complications, reaction to anesthesia, infection, neurovascular injury, and recurrence.  DESCRIPTION OF PROCEDURE:  The patient was taken to the operating suite where Bier block anesthetic was applied to the level of the forearm.  She was then prepped and draped in the normal sterile fashion.  After administration of preoperative IV antibiotics, a small zigzag incision was made on the palmar aspect of the thumb  between the IP and MCP joint areas.  Dissection was carried down to a mass on the flexor tendon sheath, which appeared slightly lobulated and tan consistent with  giant cell tumor.  This was excised.  It measured about 1.5 cm in length.  It was not particularly adherent to surrounding structures.  It was sent to pathology.  The wound was irrigated, followed by closure of the incision with ______ mattress of nylon.  The tourniquet was deflated and the thumb became pink and warm immediately.  Adaptic was placed on the wound followed by a dry gauze and a loose Ace wrap.  Estimated blood loss and intraoperative fluids  were felt to be adequate. Tourniquet time obtained from the anesthesia records.  DISPOSITION:  The patient was taken to the recovery room in stable condition. Plans were for her to go home the same day and followup in the office in a week.  I will contact her by phone tonight. DD:  06/21/99 TD:  06/22/99 Job: [id number]

## 2010-12-30 NOTE — Discharge Summary (Signed)
Joanna Salas, Joanna Salas                        ACCOUNT NO.:  [id number]   MEDICAL RECORD NO.:  [id number]                   PATIENT TYPE:  INP   LOCATION:  0380                                 FACILITY:  [facility]   PHYSICIAN:  Jamison Neighbor, M.D.               DATE OF BIRTH:  02-12-1925   DATE OF ADMISSION:  06/22/2003  DATE OF DISCHARGE:  06/23/2003                                 DISCHARGE SUMMARY   DISCHARGE DIAGNOSES:  1. Ureteral calculus.  2. Atrial fibrillation.  3. Elevated cholesterol.  4. Abdominal pain.   PROCEDURE:  Cystoscopy, retrograde, ureteroscopy, in situ laser lithotripsy,  double-J catheter insertion.   HISTORY:  This 77 year old female has had problems with stones in the past.  The patient has had a stone drop into her ureter.  She was admitted for pain  management and treatment of the kidney stone.  Her situation was complicated  by the fact that she is on Coumadin, and it was felt that if the stone could  be removed without stopping the Coumadin that would be idea, but if the  patient's stone was too tightly impacted the stone would be bypassed,  Coumadin would be discontinued, and she would eventually would be treated  with the bleeding parameters normalized.   PAST MEDICAL HISTORY:  Remarkable for:  1. Atrial fibrillation.  2. Elevated cholesterol.  3. Past history of kidney stones.   The patient currently has right-sided flank pain.   SURGERY:  1. Surgery on the thumb of the right-hand side on two separate occasions.  2. She has also had an appendectomy.  3. She has been treated in the past with lithotripsy.   CURRENT MEDICATIONS:  Betapace, verapamil, Diovan, Evista, Pravachol, Xanax,  as well as Coumadin.   ALLERGIES:  SULFA, CODEINE, MORPHINE, AND DILTIAZEM.   SOCIAL HISTORY:  Unremarkable.  She does not use tobacco or alcohol.  She is  married and lives at home with her husband.   FAMILY HISTORY, SOCIAL HISTORY, REVIEW OF SYSTEMS:   Noncontributory.   The patient was taken to the operating room where she underwent successful  in situ laser lithotripsy of the stone.  The patient was ready for discharge  the following day.  She was sent home with pain medication as well as  Macrodantin for an antibiotic.  She will use ___________ if needed.  She  will continue on her Coumadin.  Return to the office for stent removal.                                               Jamison Neighbor, M.D.    RJE/MEDQ  D:  07/08/2003  T:  07/08/2003  Job:  [id number]

## 2010-12-30 NOTE — Discharge Summary (Signed)
Joanna Salas, Joanna Salas                        ACCOUNT NO.:  [id number]   MEDICAL RECORD NO.:  [id number]                   PATIENT TYPE:  INP   LOCATION:  0380                                 FACILITY:  [facility]   PHYSICIAN:  Jamison Neighbor, M.D.               DATE OF BIRTH:  02-Jun-1925   DATE OF ADMISSION:  06/22/2003  DATE OF DISCHARGE:  06/23/2003                                 DISCHARGE SUMMARY   PREOPERATIVE DIAGNOSIS:  Right renal calculus.   POSTOPERATIVE DIAGNOSIS:  Right renal calculus.   PROCEDURE:  Cystoscopy, right retrograde; right ureteroscopy, right in situ  laser lithotripsy, and right double-J catheter insertion.   SURGEON:  Dr. Logan Bores.   ANESTHESIA:  General.   COMPLICATIONS:  None.   DRAINAGE:  16-French Foley and a 6-French x 24-cm double-J catheter.   BRIEF HISTORY:  This is a 77 year old female with a long standing history of  stones. The patient has had a stone move out of the kidney and down into the  ureter where it became impacted. The patient has had severe right ureteral  colic and required admission for pain management. The patient is to undergo  cystoscopy, retrograde studies, ureteroscopy, and laser lithotripsy, plus  double-J catheter insertion. She understands the risks and benefits of the  procedure and gave full and informed consent.   PROCEDURE:  After successful induction of general anesthesia, the patient  was placed in a dorsal lithotomy position and prepped with Betadine and  draped in the usual sterile fashion. Cystoscopy was performed. The bladder  was carefully inspected. It was free of any tumor or stones. Both ureteral  orifices were normal configuration and location. Retrograde studies  performed on the right-hand side identified a stone in the ureter. A  guidewire was passed up to the kidneys as the ureter was dilated.  Ureteroscope was inserted. The stone was identified and fragmented to  completion with the Holmium laser. The  stone fragments were evacuated. The  double-J catheter was inserted over a guidewire. It was noted to flow  normally in the bladder as well as within the renal pelvis. The patient's  bladder was drained. She tolerated the procedure well and was taken to the  recovery room in good condition. She was sent home the following day and  returned to the office for stent removal. She was sent home with Macrodantin  one daily as an antibiotic. She will restart her Coumadin. The patient will  also continue on pain medications.                                               Jamison Neighbor, M.D.    RJE/MEDQ  D:  07/08/2003  T:  07/08/2003  Job:  [id number]

## 2011-01-19 ENCOUNTER — Other Ambulatory Visit: Payer: Self-pay | Admitting: Dermatology

## 2011-03-17 ENCOUNTER — Ambulatory Visit: Payer: Medicare Other | Admitting: Internal Medicine

## 2011-05-16 LAB — BASIC METABOLIC PANEL
BUN: 12
CO2: 29
Calcium: 9.2
Chloride: 105
Creatinine, Ser: 1
GFR calc Af Amer: >60
GFR calc non Af Amer: 53 — ABNORMAL LOW
Glucose, Bld: 114 — ABNORMAL HIGH
Potassium: 4.3
Sodium: 142

## 2011-05-16 LAB — DIFFERENTIAL
Basophils Absolute: 0
Basophils Relative: 1
Eosinophils Absolute: 0.1
Eosinophils Relative: 1
Lymphocytes Relative: 18
Lymphs Abs: 1.2
Monocytes Absolute: 0.2
Monocytes Relative: 3
Neutro Abs: 5.2
Neutrophils Relative %: 78 — ABNORMAL HIGH

## 2011-05-16 LAB — PROTIME-INR
INR: 2.5 — ABNORMAL HIGH
Prothrombin Time: 28.8 — ABNORMAL HIGH

## 2011-05-16 LAB — CBC
HCT: 40.3
Hemoglobin: 13.6
MCHC: 33.7
MCV: 89.7
Platelets: 137 — ABNORMAL LOW
RBC: 4.49
RDW: 13.7
WBC: 6.7

## 2011-05-16 LAB — APTT: aPTT: 36

## 2011-05-18 LAB — URINALYSIS, ROUTINE W REFLEX MICROSCOPIC
Glucose, UA: NEGATIVE mg/dL
Ketones, ur: NEGATIVE mg/dL
Nitrite: NEGATIVE
Protein, ur: 100 mg/dL — AB
Urobilinogen, UA: 0.2 mg/dL (ref 0.0–1.0)

## 2011-05-18 LAB — POCT I-STAT, CHEM 8
BUN: 32 mg/dL — ABNORMAL HIGH (ref 6–23)
Calcium, Ion: 1.18 mmol/L (ref 1.12–1.32)
Chloride: 106 mEq/L (ref 96–112)

## 2011-05-18 LAB — URINE MICROSCOPIC-ADD ON

## 2011-05-18 LAB — CREATININE, SERUM
Creatinine, Ser: 1.4 mg/dL — ABNORMAL HIGH (ref 0.4–1.2)
GFR calc Af Amer: 44 mL/min — ABNORMAL LOW (ref >60)

## 2011-05-19 LAB — URINALYSIS, ROUTINE W REFLEX MICROSCOPIC
Glucose, UA: NEGATIVE mg/dL
Protein, ur: NEGATIVE mg/dL
Urobilinogen, UA: 0.2 mg/dL (ref 0.0–1.0)

## 2011-05-19 LAB — DIFFERENTIAL
Basophils Relative: 1 % (ref 0–1)
Eosinophils Absolute: 0.1 10*3/uL (ref 0.0–0.7)
Neutrophils Relative %: 69 % (ref 43–77)

## 2011-05-19 LAB — URINE MICROSCOPIC-ADD ON

## 2011-05-19 LAB — CBC
MCHC: 33.5 g/dL (ref 30.0–36.0)
MCV: 88.6 fL (ref 78.0–100.0)
Platelets: 116 10*3/uL — ABNORMAL LOW (ref 150–400)

## 2011-05-19 LAB — BASIC METABOLIC PANEL
BUN: 24 mg/dL — ABNORMAL HIGH (ref 6–23)
CO2: 28 mEq/L (ref 19–32)
Chloride: 106 mEq/L (ref 96–112)
Creatinine, Ser: 1.2 mg/dL (ref 0.4–1.2)

## 2011-06-22 ENCOUNTER — Ambulatory Visit (INDEPENDENT_AMBULATORY_CARE_PROVIDER_SITE_OTHER): Payer: Medicare Other | Admitting: Family Medicine

## 2011-06-22 ENCOUNTER — Encounter: Payer: Self-pay | Admitting: Family Medicine

## 2011-06-22 VITALS — BP 144/89 | HR 88 | Ht 66.5 in | Wt 166.0 lb

## 2011-06-22 DIAGNOSIS — F4320 Adjustment disorder, unspecified: Secondary | ICD-10-CM

## 2011-06-22 DIAGNOSIS — Z889 Allergy status to unspecified drugs, medicaments and biological substances status: Secondary | ICD-10-CM

## 2011-06-22 DIAGNOSIS — I4891 Unspecified atrial fibrillation: Secondary | ICD-10-CM

## 2011-06-22 DIAGNOSIS — F419 Anxiety disorder, unspecified: Secondary | ICD-10-CM

## 2011-06-22 DIAGNOSIS — J45909 Unspecified asthma, uncomplicated: Secondary | ICD-10-CM

## 2011-06-22 DIAGNOSIS — Z87442 Personal history of urinary calculi: Secondary | ICD-10-CM

## 2011-06-22 DIAGNOSIS — I499 Cardiac arrhythmia, unspecified: Secondary | ICD-10-CM

## 2011-06-22 DIAGNOSIS — J018 Other acute sinusitis: Secondary | ICD-10-CM

## 2011-06-22 DIAGNOSIS — F411 Generalized anxiety disorder: Secondary | ICD-10-CM

## 2011-06-22 DIAGNOSIS — Z860100 Personal history of colon polyps, unspecified: Secondary | ICD-10-CM

## 2011-06-22 DIAGNOSIS — M810 Age-related osteoporosis without current pathological fracture: Secondary | ICD-10-CM

## 2011-06-22 DIAGNOSIS — Z9109 Other allergy status, other than to drugs and biological substances: Secondary | ICD-10-CM

## 2011-06-22 MED ORDER — ALPRAZOLAM 0.25 MG PO TABS: 0.2500 mg | ORAL_TABLET | Freq: Every evening | ORAL | Status: DC | PRN

## 2011-06-22 NOTE — Assessment & Plan Note (Signed)
History of atrial fibrillation continue on Coumadin beta blocker and calcium blocker and follow up by  Dr. Allyson Sabal She also has her hypertension and lipid evaluated and  managed by Dr Allyson Sabal and she states that recently he took her off her lipid. control medication.

## 2011-06-22 NOTE — Assessment & Plan Note (Signed)
History of kidney stones. At this time no new stones to cause a problem but she will followup with Dr. Drue Novel if needed.

## 2011-06-22 NOTE — Progress Notes (Signed)
  Subjective:    Patient ID: Joanna Salas, female    DOB: Sep 03, 1924, 77 y.o.   MRN: [id number]  Hypertension   Patient is here to establish herself with our practice. She was going to [facility] but side effects which. She has the following problems. #1 hyperlipidemia #2 hypertension #3 atrial fibrillation #4 colonic polyps #5 history kidney stones #6 osteoporosis #7 history of anxiety and allergic asthma and rhinitis.  Situational disturbances involving her husband.   Review of Systems Other than a recent flareup of her asthma allergic rhinitis working outside yesterday most of these complaints are under control. She does states that her anxiety different today continue practice and she take 1 presenting today   the situation disturbance involves her husband and what sounds like increasing dementia and memory forgetfulness. He is still driving but she notes that he is a lot more forgetful really gets upset if she's not with him and increasingly asked her directions and a wants reassurance what they talked about it. Objective:   Physical Exam  Constitutional: She is oriented to person, place, and time. She appears well-developed and well-nourished.  HENT:  Head: Normocephalic.  Cardiovascular: Normal rate, regular rhythm and normal heart sounds.   Pulmonary/Chest: Effort normal and breath sounds normal.  Neurological: She is alert and oriented to person, place, and time.  Skin: Skin is warm and dry.  Psychiatric: She has a normal mood and affect.          Assessment & Plan:  Respiratory disturbance discuss with her that may put on Namenda when he comes back in to be seen and evaluated and if his medications at night trying to stop dementia or reverse memory loss but does try to keep it from diminishing faster. She seemed to understand that and she declines to flu shot this time since she had a severe reionact with the pneumonia vaccine.

## 2011-06-22 NOTE — Patient Instructions (Signed)
Social Anxiety Disorder Almost everyone can feel some degree of discomfort in a given social situation. However, when you feel extreme fear of social encounters and it begins to interfere with your daily functioning, you have social anxiety disorder. There are two types of this disorder.  If you have the first type, you are extremely anxious in only a few specific situations. For instance, you become anxious when answering a question out loud in class or presenting at work.   If you have the second type, you experience overwhelming worry in most or all social experiences. This may include everything from going to a doctor's appointment, to eating in a restaurant, to entering a crowded room.  When this disorder happens in very young children, it may be from a new babysitter or stranger that the child is not used to. In the very young it may show up as crying, tantrums, or withdrawal.  Most adults with social anxiety were shy and timid as children. If left untreated, these adults may appear quiet and passive in social situations. They may be highly sensitive to the criticism and disapproval of others. They may have no close friends outside of first degree relatives. They may be fearful of saying or doing something foolish or becoming emotional in front of others. As a result of these fears, they may avoid most social encounters and select jobs and personal activities that allow them to isolate themselves from others.  CAUSES  This disorder can result from the combination of several factors.   Your genetic makeup affects how sensitive you are and how much stress you can tolerate.   How you were raised as a child also plays a part. Research suggests that children raised with overprotective parents, excessive expectations, overly critical parents, low assertiveness, and/or emotional insecurity have increased feelings of anxiety.   A traumatic life event can also contribute to social anxiety; for example,  being pointed out and shamed in public or being repeatedly bullied.  SYMPTOMS  This disorder is characterized by a fear of social situations. The anxiety is marked by:  Apprehension.   Nervousness.   A feeling of unease, worry, or tension.  Anxiety may also be reflected in:  Blushing.   Restlessness.   Trembling.   Shortness of breath.   Sweating.   Muscle tics and twitches.  At higher levels of anxiety, there also may be:  Increased heart rate.   A rise in blood pressure.   Rapid breathing.   Muscle tension.  Experiencing these uncomfortable symptoms often enough can cause a person to avoid social situations. This can cause many problems in the anxiety suffeallAllergies, Generic Allergies may happen from anything your body is sensitive to. This may be food, medicines, pollens, chemicals, and nearly anything around you in everyday life that produces allergens. An allergen is anything that causes an allergy producing substance. Heredity is often a factor in causing these problems. This means you may have some of the same allergies as your parents. Food allergies happen in all age groups. Food allergies are some of the most severe and life threatening. Some common food allergies are cow's milk, seafood, eggs, nuts, wheat, and soybeans. SYMPTOMS   Swelling around the mouth.   An itchy red rash or hives.   Vomiting or diarrhea.   Difficulty breathing.  SEVERE ALLERGIC REACTIONS ARE LIFE-THREATENING. This reaction is called anaphylaxis. It can cause the mouth and throat to swell and cause difficulty with breathing and swallowing. In severe reactions only  a trace amount of food (for example, peanut oil in a salad) may cause death within seconds. Seasonal allergies occur in all age groups. These are seasonal because they usually occur during the same season every year. They may be a reaction to molds, grass pollens, or tree pollens. Other causes of problems are house dust mite  allergens, pet dander, and mold spores. The symptoms often consist of nasal congestion, a runny itchy nose associated with sneezing, and tearing itchy eyes. There is often an associated itching of the mouth and ears. The problems happen when you come in contact with pollens and other allergens. Allergens are the particles in the air that the body reacts to with an allergic reaction. This causes you to release allergic antibodies. Through a chain of events, these eventually cause you to release histamine into the blood stream. Although it is meant to be protective to the body, it is this release that causes your discomfort. This is why you were given anti-histamines to feel better. If you are unable to pinpoint the offending allergen, it may be determined by skin or blood testing. Allergies cannot be cured but can be controlled with medicine. Hay fever is a collection of all or some of the seasonal allergy problems. It may often be treated with simple over-the-counter medicine such as diphenhydramine. Take medicine as directed. Do not drink alcohol or drive while taking this medicine. Check with your caregiver or package insert for child dosages. If these medicines are not effective, there are many new medicines your caregiver can prescribe. Stronger medicine such as nasal spray, eye drops, and corticosteroids may be used if the first things you try do not work well. Other treatments such as immunotherapy or desensitizing injections can be used if all else fails. Follow up with your caregiver if problems continue. These seasonal allergies are usually not life threatening. They are generally more of a nuisance that can often be handled using medicine. HOME CARE INSTRUCTIONS   If unsure what causes a reaction, keep a diary of foods eaten and symptoms that follow. Avoid foods that cause reactions.   If hives or rash are present:   Take medicine as directed.   You may use an over-the-counter antihistamine  (diphenhydramine) for hives and itching as needed.   Apply cold compresses (cloths) to the skin or take baths in cool water. Avoid hot baths or showers. Heat will make a rash and itching worse.   If you are severely allergic:   Following a treatment for a severe reaction, hospitalization is often required for closer follow-up.   Wear a medic-alert bracelet or necklace stating the allergy.   You and your family must learn how to give adrenaline or use an anaphylaxis kit.   If you have had a severe reaction, always carry your anaphylaxis kit or EpiPen with you. Use this medicine as directed by your caregiver if a severe reaction is occurring. Failure to do so could have a fatal outcome.  SEEK MEDICAL CARE IF:  You suspect a food allergy. Symptoms generally happen within 30 minutes of eating a food.   Your symptoms have not gone away within 2 days or are getting worse.   You develop new symptoms.   You want to retest yourself or your child with a food or drink you think causes an allergic reaction. Never do this if an anaphylactic reaction to that food or drink has happened before. Only do this under the care of a caregiver.  SEEK IMMEDIATE  MEDICAL CARE IF:   You have difficulty breathing, are wheezing, or have a tight feeling in your chest or throat.   You have a swollen mouth, or you have hives, swelling, or itching all over your body.   You have had a severe reaction that has responded to your anaphylaxis kit or an EpiPen. These reactions may return when the medicine has worn off. These reactions should be considered life threatening.  MAKE SURE YOU:   Understand these instructions.   Will watch your condition.   Will get help right away if you are not doing well or get worse.  Document Released: 10/24/2002 Document Revised: 04/12/2011 Document Reviewed: 03/30/2008 [employer] Patient Information 2012 [employer].rer's life.  TREATMENT  There are many types of treatment  available for social anxiety disorder.  Group therapy allows you to see that you are not alone with these problems.   Individual therapy helps you address anxiety issues with a caring professional.   Relaxation techniques may be used as tools to help you overcome fear.   Hypnosis may help change the way you think about social settings.   Numerous medications are available that your caregiver can prescribe to help during a difficult time. Medications can be used for a brief period of time. The goal of this treatment is to help recondition you so that once you quit taking the medications, your anxiety will not return.  PROGNOSIS  Social anxiety disorder is a common problem that is very treatable. Individuals who participate in treatment have a very high success rate. When treated properly, the prognosis is very good to excellent. Document Released: 06/29/2005 Document Revised: 04/12/2011 Document Reviewed: 06/25/2007 [facility] Patient Information 2012 [place], [place].

## 2011-06-22 NOTE — Assessment & Plan Note (Signed)
Anxiety disorder. She has been getting her anxiety medicine Xanax 0.25 mg at primary care. Will assume responsibility for prescribing her Xanax and prescription given with 3 refills she uses it sparingly and doesn't use it everyday hopefully the 3 refills last a year.

## 2011-06-22 NOTE — Assessment & Plan Note (Signed)
She's had a colonoscopy in the last 5 years.

## 2011-06-22 NOTE — Assessment & Plan Note (Signed)
Because of osteoporosis as her dowager's hump she is on Evista daily.

## 2011-06-22 NOTE — Assessment & Plan Note (Signed)
History of the allergic asthma she was working the yard yesterday and had to use her inhaler she will follow Dr. Maple Hudson if she has any major problem encourage her use of the short acting inhalers albuterol and steroid inhaler as needed

## 2011-06-23 ENCOUNTER — Ambulatory Visit (INDEPENDENT_AMBULATORY_CARE_PROVIDER_SITE_OTHER): Payer: Medicare Other | Admitting: Internal Medicine

## 2011-06-23 ENCOUNTER — Encounter: Payer: Self-pay | Admitting: Internal Medicine

## 2011-06-23 VITALS — BP 120/80 | HR 70 | Ht 67.0 in | Wt 168.4 lb

## 2011-06-23 DIAGNOSIS — R05 Cough: Secondary | ICD-10-CM

## 2011-06-23 DIAGNOSIS — J45909 Unspecified asthma, uncomplicated: Secondary | ICD-10-CM

## 2011-06-23 MED ORDER — METHYLPREDNISOLONE ACETATE 80 MG/ML IJ SUSP
80.0000 mg | Freq: Once | INTRAMUSCULAR | Status: AC
  Administered 2011-06-23: 80 mg via INTRAMUSCULAR

## 2011-06-23 MED ORDER — LEVALBUTEROL HCL 0.63 MG/3ML IN NEBU
0.6300 mg | INHALATION_SOLUTION | Freq: Once | RESPIRATORY_TRACT | Status: AC
  Administered 2011-06-23: 0.63 mg via RESPIRATORY_TRACT

## 2011-06-23 MED ORDER — AEROCHAMBER MV MISC: Status: DC

## 2011-06-23 MED ORDER — AEROCHAMBER MV MISC: Status: AC

## 2011-06-23 NOTE — Patient Instructions (Addendum)
Wear a dust mask when you are out in dusty leaves and wind  Script printed aerochamber  - use this with Qvar steroid inhaler  2 puffs through the chamber, then rinse mough well. Do this one time every day.                                                                With Proair rescue inhaler  2 puffs through chamber, up to 4 times daily if needed to open tight lungs   Neb xop 0.63  Depo 80

## 2011-06-23 NOTE — Progress Notes (Signed)
06/23/11- 86 yoF never smoker followed for asthma, rhinitis, complicated by A. fib, HBP LOV-10/14/2010 She declines flu shot but had pneumonia vaccine. For the past week has intermittent chest congestion and morning cough, some blood in mucus from right nostril. If she hurries she gets short of breath and in the winter she will wheeze. She does not think she has a cold. Denies chest pain, swelling, fever. Husband turns the heat up in the home much too high for her comfort. She is confused about the roles of Qvar and her rescue inhaler.  ROS-see HPI Constitutional:   No-   weight loss, night sweats, fevers, chills, fatigue, lassitude. HEENT:   No-  headaches, difficulty swallowing, tooth/dental problems, sore throat,       No-  sneezing, itching, ear ache, nasal congestion, post nasal drip,  CV:  No-   chest pain, orthopnea, PND, swelling in lower extremities, anasarca,                                  dizziness, palpitations Resp: +  shortness of breath with exertion or at rest.              No-   productive cough,  + non-productive cough,  No- coughing up of blood.              No-   change in color of mucus.  + wheezing.   Skin: No-   rash or lesions. GI:  No-   heartburn, indigestion, abdominal pain, nausea, vomiting, diarrhea,                 change in bowel habits, loss of appetite GU: No-   dysuria, change in color of urine, no urgency or frequency.  No- flank pain. MS:  No-   joint pain or swelling.  No- decreased range of motion.  No- back pain. Neuro-     nothing unusual Psych:  No- change in mood or affect. No depression or anxiety.  No memory loss.  OBJ General- Alert, Oriented, Affect-appropriate, Distress- none acute Skin- rash-none, lesions- none, excoriation- none Lymphadenopathy- none Head- atraumatic            Eyes- Gross vision intact, PERRLA, conjunctivae clear secretions            Ears- Hearing, canals-normal            Nose- Clear, no-Septal dev, mucus, polyps,  erosion, perforation             Throat- Mallampati II , mucosa clear , drainage- none, tonsils- atrophic Neck- flexible , trachea midline, no stridor , thyroid nl, carotid no bruit Chest - symmetrical excursion , unlabored           Heart/CV- nearly RRR , no murmur , no gallop  , no rub, nl s1 s2                           - JVD- none , edema- none, stasis changes- none, varices- none           Lung- clear to P&A, diminished, wheeze- none, cough- none , dullness-none, rub- none           Chest wall-  Abd- tender-no, distended-no, bowel sounds-present, HSM- no Br/ Gen/ Rectal- Not done, not indicated Extrem- cyanosis- none, clubbing, none, atrophy- none, strength- nl Neuro- grossly intact to observation

## 2011-06-27 NOTE — Assessment & Plan Note (Signed)
Acute asthmatic bronchitis. Suspect a viral infection component. Plan-we discussed wearing a mask in dusty situations especially outdoors. AeroChamber. Nebulized bronchodilator, Depo-Medrol.

## 2011-06-29 ENCOUNTER — Ambulatory Visit (INDEPENDENT_AMBULATORY_CARE_PROVIDER_SITE_OTHER): Payer: Medicare Other | Admitting: Family Medicine

## 2011-06-29 VITALS — BP 139/75 | HR 84

## 2011-06-29 DIAGNOSIS — R351 Nocturia: Secondary | ICD-10-CM

## 2011-06-29 LAB — POCT URINALYSIS DIPSTICK
Protein, UA: NEGATIVE
Spec Grav, UA: 1.02
Urobilinogen, UA: 0.2

## 2011-06-29 MED ORDER — CIPROFLOXACIN HCL 500 MG PO TABS: 500.0000 mg | ORAL_TABLET | Freq: Two times a day (BID) | ORAL | Status: AC

## 2011-06-29 NOTE — Progress Notes (Signed)
  Subjective:    Patient ID: Joanna Salas, female    DOB: [birth year], 77 y.o.   MRN: [id number] Pt here for UA- c/o lower pelvic pain,low back pain and frequency HPI    Review of Systems     Objective:   Physical Exam        Assessment & Plan:  UTI - Will call in med for UTI. Call if not better in 3 days.

## 2011-08-22 ENCOUNTER — Telehealth: Payer: Self-pay | Admitting: Internal Medicine

## 2011-08-22 MED ORDER — AZITHROMYCIN 250 MG PO TABS: ORAL_TABLET | ORAL | Status: AC

## 2011-08-22 NOTE — Telephone Encounter (Signed)
I spoke with pt and she c/o sore throat, PND, nasal congestion, runny nose x 2 days. Pt denies any fever, nausea, vomiting, cough, SOB, wheezing, chest tightness. Pt states she has been using saline nasal spray. Pt would like something called in for her. Please advise Dr. Maple Hudson, thanks  Allergies  Allergen Reactions  . Atorvastatin     REACTION: unspecified  . Morphine Sulfate     REACTION: unspecified  . Penicillins     REACTION: unspecified  . Prednisone   . Sulfonamide Derivatives

## 2011-08-22 NOTE — Telephone Encounter (Signed)
ATC pt but NA unable to leave [facility]

## 2011-08-22 NOTE — Telephone Encounter (Signed)
Returning call.Joanna Salas ° °

## 2011-08-22 NOTE — Telephone Encounter (Signed)
Spoke with pt and notified of recs per CDY.  Pt verbalized understanding and rx was sent to pharm. 

## 2011-08-22 NOTE — Telephone Encounter (Signed)
Per CY okay to give Zpak #1 take as directed no refills. 

## 2011-08-24 ENCOUNTER — Telehealth: Payer: Self-pay | Admitting: Internal Medicine

## 2011-08-24 MED ORDER — METHYLPREDNISOLONE 8 MG PO TABS: ORAL_TABLET | ORAL | Status: DC

## 2011-08-24 NOTE — Telephone Encounter (Signed)
Per Cy-okay to give Medrol 8 mg #10 take 2 daily until gone no refills; pt is aware of Rx sent to [facility].

## 2011-08-24 NOTE — Telephone Encounter (Signed)
Called spoke with patient who stated that her nasal symptoms from 1.8.13 have improved with zpak (has 2 pills left) but is still having clear nasal drainage, left ear congestion and is now having a "dry wheezing cough" that is keeping her up at night.  Pt denies SOB, chest congestion, f/c/s and reported that she had to use her rescue last night for the wheezing.  Pt also reported that her cough "isn't bothering her much this morning."  Dr Maple Hudson please advise, thanks.  [facility].    Allergies  Allergen Reactions  . Atorvastatin     REACTION: unspecified  . Morphine Sulfate     REACTION: unspecified  . Penicillins     REACTION: unspecified  . Prednisone   . Sulfonamide Derivatives

## 2011-08-24 NOTE — Telephone Encounter (Signed)
Pt called to check on status of her appointment w/ Dr. Maple Hudson.  Pt kept repeating that all she needs is a shot & a Rx & that always works.  Antionette Fairy

## 2011-08-25 ENCOUNTER — Telehealth: Payer: Self-pay | Admitting: Internal Medicine

## 2011-08-25 NOTE — Telephone Encounter (Signed)
Pt called back--she is aware per CY to stop the medrol and finish the zpak and increase fluids and wait for her stomach to settle over the weekend.  Pt stated that if she could take the meds at different times then she would be able to complete the rx.  CY please advise.  thanks

## 2011-08-25 NOTE — Telephone Encounter (Signed)
I spoke with pt and she states the medrol has been maker her nauseated and had some vomiting. Pt was giving medrol 4mg  take 4 daily until gone bc they did not have the 8 mg tablet. Pt states she is also now cough up yellow to green phlem. She states she has 1 day left of the zpak Pt is requesting recs from Dr. Maple Hudson. Please advise, thanks  Allergies  Allergen Reactions  . Atorvastatin     REACTION: unspecified  . Morphine Sulfate     REACTION: unspecified  . Penicillins     REACTION: unspecified  . Prednisone   . Sulfonamide Derivatives

## 2011-08-25 NOTE — Telephone Encounter (Signed)
Pt returned Katie's call.  Holly D Pryor ° °

## 2011-08-25 NOTE — Telephone Encounter (Signed)
Per CY-okay to space meds as she wants; spoke with patient -aware of recs from CY.

## 2011-08-25 NOTE — Telephone Encounter (Signed)
Per CY-stop medrol, finish Zpak, and Increase fluids and wait for stomach to settle over weekend.   LMTCB.

## 2011-08-28 ENCOUNTER — Ambulatory Visit (INDEPENDENT_AMBULATORY_CARE_PROVIDER_SITE_OTHER): Payer: Medicare Other | Admitting: Internal Medicine

## 2011-08-28 ENCOUNTER — Encounter: Payer: Self-pay | Admitting: Internal Medicine

## 2011-08-28 VITALS — BP 122/62 | HR 69 | Ht 67.0 in | Wt 164.4 lb

## 2011-08-28 DIAGNOSIS — J069 Acute upper respiratory infection, unspecified: Secondary | ICD-10-CM

## 2011-08-28 DIAGNOSIS — J018 Other acute sinusitis: Secondary | ICD-10-CM

## 2011-08-28 DIAGNOSIS — J45909 Unspecified asthma, uncomplicated: Secondary | ICD-10-CM

## 2011-08-28 MED ORDER — PHENYLEPHRINE HCL 1 % NA SOLN
3.0000 [drp] | Freq: Once | NASAL | Status: AC
  Administered 2011-08-28: 3 [drp] via NASAL

## 2011-08-28 MED ORDER — DOXYCYCLINE HYCLATE 100 MG PO TABS: ORAL_TABLET | ORAL | Status: AC

## 2011-08-28 MED ORDER — METHYLPREDNISOLONE ACETATE 80 MG/ML IJ SUSP
80.0000 mg | Freq: Once | INTRAMUSCULAR | Status: AC
  Administered 2011-08-28: 80 mg via INTRAMUSCULAR

## 2011-08-28 NOTE — Patient Instructions (Signed)
Script sent for doxycycline    This will affect your blood thinner temporarily  Neb neo nasal   Depo 80

## 2011-08-28 NOTE — Progress Notes (Signed)
06/23/11- 86 yoF never smoker followed for asthma, rhinitis, complicated by A. fib, HBP LOV-10/14/2010 She declines flu shot but had pneumonia vaccine. For the past week has intermittent chest congestion and morning cough, some blood in mucus from right nostril. If she hurries she gets short of breath and in the winter she will wheeze. She does not think she has a cold. Denies chest pain, swelling, fever. Husband turns the heat up in the home much too high for her comfort. She is confused about the roles of Qvar and her rescue inhaler.  08/28/11- 86 yoF never smoker followed for asthma, rhinitis, complicated by A. fib, HBP   Had declined flu vax  acute illness-1 week , began with left maxillary and left ear pressure pain, sore throat. She had taken a Medrol taper which opened her and she began coughing out green phlegm. We gave Z-Pak January 8. She blamed Medrol for nausea and vomiting . Now nasal congestion, post nasal drip. Chest is no longer tight. Sputum is clear, no fever or pain.   ROS-see HPI Constitutional:   No-   weight loss, night sweats, fevers, chills, fatigue, lassitude. HEENT:   No-  headaches, difficulty swallowing, tooth/dental problems, sore throat,       No-  sneezing, itching, ear ache,  +nasal congestion, post nasal drip,  CV:  No-   chest pain, orthopnea, PND, swelling in lower extremities, anasarca, dizziness, palpitations Resp: +  shortness of breath with exertion or at rest.              +  productive cough,  + non-productive cough,  No- coughing up of blood.              No-   change in color of mucus.  + wheezing.   Skin: No-   rash or lesions. GI:  No-   heartburn, indigestion, abdominal pain, nausea, vomiting, diarrhea,                 change in bowel habits, loss of appetite GU: MS:  No-   joint pain or swelling.  No- decreased range of motion.  No- back pain. Neuro-     nothing unusual Psych:  No- change in mood or affect. No depression or anxiety.  No memory  loss.  OBJ General- Alert, Oriented, Affect-appropriate, Distress- none acute Skin- rash-none, lesions- none, excoriation- none Lymphadenopathy- none Head- atraumatic            Eyes- Gross vision intact, PERRLA, conjunctivae clear secretions            Ears- Hearing, canals-normal            Nose- Clear, no-Septal dev, mucus, polyps, erosion, perforation             Throat- Mallampati II , mucosa clear , drainage- none, tonsils- atrophic Neck- flexible , trachea midline, no stridor , thyroid nl, carotid no bruit Chest - symmetrical excursion , unlabored           Heart/CV- nearly RRR , no murmur , no gallop  , no rub, nl s1 s2                           - JVD- none , edema- none, stasis changes- none, varices- none           Lung- paroxysmal wheezy cough , dullness-none, rub- none           Chest wall-  Abd- Br/ Gen/ Rectal- Not done, not indicated Extrem- cyanosis- none, clubbing, none, atrophy- none, strength- nl Neuro- grossly intact to observation

## 2011-08-29 NOTE — Assessment & Plan Note (Signed)
She is gradually improving, no longer purulent and chest is no longer tight.  Plan - Depo-Medrol injection should help both her upper and lower respiratory tract get over inflammation.

## 2011-08-29 NOTE — Assessment & Plan Note (Addendum)
She is resolving an acute maxillary sinusitis with eustachian dysfunction and otitis.  Plan extend antibiotic coverage with doxycycline. Improve decongestant status with nasal Neo-Synephrine , Depo-Medrol. Discussed saline lavage.

## 2011-08-30 ENCOUNTER — Telehealth: Payer: Self-pay | Admitting: Internal Medicine

## 2011-08-30 MED ORDER — HYDROCODONE-HOMATROPINE 5-1.5 MG/5ML PO SYRP: 5.0000 mL | ORAL_SOLUTION | Freq: Four times a day (QID) | ORAL | Status: AC | PRN

## 2011-08-30 NOTE — Telephone Encounter (Signed)
Per CY-okay to give Hydromet cough syrup #200 ml 1 tsp every 6 hours prn cough no refills.

## 2011-08-30 NOTE — Telephone Encounter (Signed)
Addended by: Christen Butter on: 08/30/2011 11:19 AM   Modules accepted: Orders

## 2011-08-30 NOTE — Telephone Encounter (Addendum)
Spoke with pt and she states that the cough that she had at last visit is some better, but still bothering her at night- states had to sleep in her recliner last night. She is resistant to try otc cough suppressants with her cardiac meds and wants CDY to call her in something that will interact with these meds. Please advise, thanks! Allergies  Allergen Reactions  . Atorvastatin     REACTION: unspecified  . Morphine Sulfate     REACTION: unspecified  . Penicillins     REACTION: unspecified  . Prednisone   . Sulfonamide Derivatives

## 2011-08-30 NOTE — Telephone Encounter (Signed)
Closed encounter in error, will forward to CDY

## 2011-08-30 NOTE — Telephone Encounter (Signed)
Rx was called to pharmacy. Pt aware and states nothing further needed.

## 2011-11-10 ENCOUNTER — Emergency Department (HOSPITAL_COMMUNITY)
Admission: EM | Admit: 2011-11-10 | Discharge: 2011-11-10 | Disposition: A | Discharge status: Home / Self Care | Payer: Medicare Other | Attending: Emergency Medicine | Admitting: Emergency Medicine

## 2011-11-10 DIAGNOSIS — R42 Dizziness and giddiness: Secondary | ICD-10-CM | POA: Insufficient documentation

## 2011-11-10 DIAGNOSIS — I10 Essential (primary) hypertension: Secondary | ICD-10-CM | POA: Insufficient documentation

## 2011-11-10 DIAGNOSIS — R011 Cardiac murmur, unspecified: Secondary | ICD-10-CM | POA: Insufficient documentation

## 2011-11-10 DIAGNOSIS — I498 Other specified cardiac arrhythmias: Secondary | ICD-10-CM | POA: Insufficient documentation

## 2011-11-10 DIAGNOSIS — J45909 Unspecified asthma, uncomplicated: Secondary | ICD-10-CM | POA: Insufficient documentation

## 2011-11-10 DIAGNOSIS — Z79899 Other long term (current) drug therapy: Secondary | ICD-10-CM | POA: Insufficient documentation

## 2011-11-10 DIAGNOSIS — M1990 Unspecified osteoarthritis, unspecified site: Secondary | ICD-10-CM | POA: Insufficient documentation

## 2011-11-10 DIAGNOSIS — R112 Nausea with vomiting, unspecified: Secondary | ICD-10-CM | POA: Insufficient documentation

## 2011-11-10 DIAGNOSIS — I4891 Unspecified atrial fibrillation: Secondary | ICD-10-CM | POA: Insufficient documentation

## 2011-11-10 DIAGNOSIS — Z7901 Long term (current) use of anticoagulants: Secondary | ICD-10-CM | POA: Insufficient documentation

## 2011-11-10 DIAGNOSIS — M810 Age-related osteoporosis without current pathological fracture: Secondary | ICD-10-CM | POA: Insufficient documentation

## 2011-11-10 DIAGNOSIS — E785 Hyperlipidemia, unspecified: Secondary | ICD-10-CM | POA: Insufficient documentation

## 2011-11-10 LAB — URINALYSIS, ROUTINE W REFLEX MICROSCOPIC
Bilirubin Urine: NEGATIVE
Glucose, UA: NEGATIVE mg/dL
Hgb urine dipstick: NEGATIVE
Ketones, ur: NEGATIVE mg/dL
Nitrite: NEGATIVE
Specific Gravity, Urine: 1.023 (ref 1.005–1.030)
pH: 5.5 (ref 5.0–8.0)

## 2011-11-10 LAB — BASIC METABOLIC PANEL
BUN: 13 mg/dL (ref 6–23)
CO2: 28 mEq/L (ref 19–32)
Chloride: 103 mEq/L (ref 96–112)
Creatinine, Ser: 0.98 mg/dL (ref 0.50–1.10)
Glucose, Bld: 131 mg/dL — ABNORMAL HIGH (ref 70–99)
Potassium: 4.1 mEq/L (ref 3.5–5.1)

## 2011-11-10 LAB — DIFFERENTIAL
Basophils Absolute: 0 10*3/uL (ref 0.0–0.1)
Basophils Relative: 0 % (ref 0–1)
Eosinophils Relative: 0 % (ref 0–5)
Lymphocytes Relative: 12 % (ref 12–46)

## 2011-11-10 LAB — URINE MICROSCOPIC-ADD ON

## 2011-11-10 LAB — CBC
MCHC: 32.1 g/dL (ref 30.0–36.0)
MCV: 95.8 fL (ref 78.0–100.0)
Platelets: 132 10*3/uL — ABNORMAL LOW (ref 150–400)
RDW: 13.8 % (ref 11.5–15.5)
WBC: 7.1 10*3/uL (ref 4.0–10.5)

## 2011-11-10 LAB — PROTIME-INR: INR: 2.51 — ABNORMAL HIGH (ref 0.00–1.49)

## 2011-11-10 MED ORDER — ONDANSETRON HCL 4 MG PO TABS: 4.0000 mg | ORAL_TABLET | Freq: Four times a day (QID) | ORAL | Status: AC

## 2011-11-10 MED ORDER — MECLIZINE HCL 50 MG PO TABS: 50.0000 mg | ORAL_TABLET | Freq: Three times a day (TID) | ORAL | Status: AC | PRN

## 2011-11-10 NOTE — ED Provider Notes (Signed)
History     CSN: [id number]  Arrival date & time 11/10/11  1543   First MD Initiated Contact with Patient 11/10/11 1628      Chief Complaint  Patient presents with  . Nausea  . Emesis    (Consider location/radiation/quality/duration/timing/severity/associated sxs/prior treatment) Patient is a 78 y.o. female presenting with vomiting. The history is provided by the patient and a relative.  Emesis  Pertinent negatives include no abdominal pain, no chills, no cough, no diarrhea, no fever and no headaches.   the patient is an 78 year old, female, who presents to the emergency department complaining of 2 episodes of dizziness associated with nausea and vomiting.  When she woke up.  This morning.  She said she felt dizzy.  She had to sit at the side of the bed until the symptoms resolved.  Then, she had a normal day until after eating lunch with her husband.  She had green beans, and candied .  Yams.  She developed dizziness again, associated with multiple episodes of nausea and vomiting.  She had one loose stool, but no diarrhea.  She denied pain anywhere.  She denied palpitations of her heart.  She did not have any vision changes, or weakness.  She says that she is asymptomatic now and she would like to go home.  She denied any recent illnesses or changing any of her medications.  Past Medical History  Diagnosis Date  . Personal history of colonic polyps   . Acute upper respiratory infections of unspecified site   . Atrial fibrillation   . Allergic rhinitis, cause unspecified   . Unspecified asthma   . Personal history of other diseases of digestive system   . Personal history of urinary calculi   . Osteoporosis, unspecified   . Osteoarthrosis, unspecified whether generalized or localized, unspecified site   . Unspecified essential hypertension   . Other and unspecified hyperlipidemia   . Anxiety state, unspecified     Past Surgical History  Procedure Date  . Appendectomy   .  Tonsillectomy   . Hemicolectomy     right    Family History  Problem Relation Age of Onset  . Heart disease Mother   . Heart disease Father     History  Substance Use Topics  . Smoking status: Never Smoker   . Smokeless tobacco: Not on file  . Alcohol Use: Not on file    OB History    Grav Para Term Preterm Abortions TAB SAB Ect Mult Living                  Review of Systems  Constitutional: Negative for fever and chills.  HENT: Negative for nosebleeds and congestion.   Eyes: Negative for visual disturbance.  Respiratory: Negative for cough, chest tightness and shortness of breath.   Cardiovascular: Negative for chest pain and palpitations.  Gastrointestinal: Positive for nausea and vomiting. Negative for abdominal pain and diarrhea.  Genitourinary: Negative for dysuria and hematuria.  Musculoskeletal: Negative for back pain.  Skin: Negative for rash.  Neurological: Positive for dizziness. Negative for headaches.  Psychiatric/Behavioral: Negative for confusion.  All other systems reviewed and are negative.    Allergies  Atorvastatin; Morphine sulfate; Penicillins; Prednisone; and Sulfonamide derivatives  Home Medications   Current Outpatient Rx  Name Route Sig Dispense Refill  . ALBUTEROL SULFATE HFA 108 (90 BASE) MCG/ACT IN AERS Inhalation Inhale 2 puffs into the lungs every 6 (six) hours as needed. For shortness of breath    .  ALPRAZOLAM 0.25 MG PO TABS Oral Take 1 tablet (0.25 mg total) by mouth at bedtime as needed. 30 tablet 3  . BECLOMETHASONE DIPROPIONATE 80 MCG/ACT IN AERS Inhalation Inhale 1 puff into the lungs as needed. For shortness of breath    . METOPROLOL TARTRATE 50 MG PO TABS Oral Take 50 mg by mouth 2 (two) times daily.      Marland Kitchen RALOXIFENE HCL 60 MG PO TABS Oral Take 60 mg by mouth daily.      Ival Bible MV MISC  Use as instructed 1 each 0  . VALSARTAN 320 MG PO TABS Oral Take 320 mg by mouth daily.      Marland Kitchen VERAPAMIL HCL ER 240 MG PO CP24 Oral  Take 240 mg by mouth daily.      . WARFARIN SODIUM 2.5 MG PO TABS Oral Take 1.25-2.5 mg by mouth daily. Patient alternates every other day with 1.25mg  to 2.5mg .      BP 143/66  Pulse 66  Temp(Src) 97.8 F (36.6 C) (Oral)  Resp 18  SpO2 94%  Physical Exam  Vitals reviewed. Constitutional: She is oriented to person, place, and time. She appears well-developed and well-nourished. No distress.  HENT:  Head: Normocephalic and atraumatic.  Eyes: Conjunctivae and EOM are normal. Pupils are equal, round, and reactive to light.       No nystagmus  Neck: Normal range of motion. Neck supple.  Cardiovascular:  Murmur heard.      Irregular heartbeat  Pulmonary/Chest: Effort normal.  Abdominal: Soft. She exhibits no distension.  Musculoskeletal: Normal range of motion. She exhibits no edema.  Neurological: She is oriented to person, place, and time. No cranial nerve deficit. Coordination normal.       No Romberg or arm drift  Skin: Skin is warm and dry.  Psychiatric: She has a normal mood and affect. Thought content normal.    ED Course  Procedures (including critical care time) 78 year old, female, presents with 2 episodes of dizziness and nausea and vomiting, which are now resolved.  She is asymptomatic.  She has not been sick recently.  Except for her chronic atrial fibrillation.  She has a normal neurological and physical examination  Labs Reviewed  CBC  DIFFERENTIAL  BASIC METABOLIC PANEL  URINALYSIS, ROUTINE W REFLEX MICROSCOPIC  PROTIME-INR   No results found.   No diagnosis found.    MDM  Dizziness, resolved. Asx.  No uti, anemia, no neuro deficits. No signs systemic illness.        Cheri Guppy, MD 11/10/11 [phone]

## 2011-11-10 NOTE — Discharge Instructions (Signed)
Your blood tests, and urine tests do not show any signs of infection or other significant illness.  Use meclizine for recurrent dizziness, and Zofran for nausea and vomiting.  Followup with your Dr. if your symptoms.  Last more than 2-3 days.  Return for worse or uncontrolled symptoms

## 2011-11-10 NOTE — ED Notes (Signed)
Sudden onset N/V after eating lunch today at "a cafeteria"  Also c/o feeling "sick, loopy, dizzy"  Hx Afib. HR currently 66.  BP 155/92.  EMS reports an IV had been attempted to [facility] and infiltrated which is now very swollen.

## 2011-11-10 NOTE — ED Notes (Signed)
JYN:[id number]<AO> Expected date:<BR> Expected time:<BR> Means of arrival:<BR> Comments:<BR> EMS N/V

## 2011-11-16 ENCOUNTER — Other Ambulatory Visit: Payer: Self-pay | Admitting: *Deleted

## 2011-11-16 MED ORDER — ALPRAZOLAM 0.25 MG PO TABS: 0.2500 mg | ORAL_TABLET | Freq: Every evening | ORAL | Status: DC | PRN

## 2011-12-13 ENCOUNTER — Encounter (HOSPITAL_BASED_OUTPATIENT_CLINIC_OR_DEPARTMENT_OTHER): Payer: Self-pay | Admitting: *Deleted

## 2011-12-13 ENCOUNTER — Emergency Department (HOSPITAL_BASED_OUTPATIENT_CLINIC_OR_DEPARTMENT_OTHER)
Admission: EM | Admit: 2011-12-13 | Discharge: 2011-12-13 | Disposition: A | Discharge status: Home / Self Care | Payer: Medicare Other | Attending: Emergency Medicine | Admitting: Emergency Medicine

## 2011-12-13 ENCOUNTER — Emergency Department (INDEPENDENT_AMBULATORY_CARE_PROVIDER_SITE_OTHER): Payer: Medicare Other

## 2011-12-13 DIAGNOSIS — I4891 Unspecified atrial fibrillation: Secondary | ICD-10-CM | POA: Insufficient documentation

## 2011-12-13 DIAGNOSIS — N281 Cyst of kidney, acquired: Secondary | ICD-10-CM

## 2011-12-13 DIAGNOSIS — R109 Unspecified abdominal pain: Secondary | ICD-10-CM

## 2011-12-13 DIAGNOSIS — N390 Urinary tract infection, site not specified: Secondary | ICD-10-CM | POA: Insufficient documentation

## 2011-12-13 DIAGNOSIS — Z7901 Long term (current) use of anticoagulants: Secondary | ICD-10-CM | POA: Insufficient documentation

## 2011-12-13 LAB — BASIC METABOLIC PANEL
Chloride: 104 mEq/L (ref 96–112)
GFR calc Af Amer: 58 mL/min — ABNORMAL LOW (ref >90)
GFR calc non Af Amer: 50 mL/min — ABNORMAL LOW (ref >90)
Glucose, Bld: 116 mg/dL — ABNORMAL HIGH (ref 70–99)
Potassium: 3.8 mEq/L (ref 3.5–5.1)
Sodium: 141 mEq/L (ref 135–145)

## 2011-12-13 LAB — CBC
HCT: 41.8 % (ref 36.0–46.0)
Hemoglobin: 13.8 g/dL (ref 12.0–15.0)
RBC: 4.55 MIL/uL (ref 3.87–5.11)

## 2011-12-13 LAB — URINE MICROSCOPIC-ADD ON

## 2011-12-13 LAB — URINALYSIS, ROUTINE W REFLEX MICROSCOPIC
Bilirubin Urine: NEGATIVE
Ketones, ur: NEGATIVE mg/dL
Nitrite: NEGATIVE
Protein, ur: NEGATIVE mg/dL
pH: 5.5 (ref 5.0–8.0)

## 2011-12-13 MED ORDER — CIPROFLOXACIN HCL 500 MG PO TABS
500.0000 mg | ORAL_TABLET | Freq: Once | ORAL | Status: AC
  Administered 2011-12-13: 500 mg via ORAL
  Filled 2011-12-13: qty 1

## 2011-12-13 MED ORDER — CIPROFLOXACIN IN D5W 400 MG/200ML IV SOLN: 400.0000 mg | Freq: Once | INTRAVENOUS | Status: DC

## 2011-12-13 MED ORDER — CIPROFLOXACIN HCL 500 MG PO TABS: 500.0000 mg | ORAL_TABLET | Freq: Two times a day (BID) | ORAL | Status: AC

## 2011-12-13 MED ORDER — HYDROMORPHONE HCL PF 1 MG/ML IJ SOLN: 0.5000 mg | Freq: Once | INTRAMUSCULAR | Status: DC

## 2011-12-13 MED ORDER — ONDANSETRON HCL 4 MG/2ML IJ SOLN: 4.0000 mg | Freq: Once | INTRAMUSCULAR | Status: DC

## 2011-12-13 NOTE — Discharge Instructions (Signed)
Return to the ED with any concerns including fever/chills, vomiting and not able to keep down liquids, worsening pain, decreased level of alertness/lethargy, or any other alarming symptoms °

## 2011-12-13 NOTE — ED Notes (Signed)
Pt c/o left flank pain x 2 days which radiates to abd . HX kidney stone

## 2011-12-13 NOTE — ED Provider Notes (Signed)
History     CSN: [id number]  Arrival date & time 12/13/11  1642   First MD Initiated Contact with Patient 12/13/11 1747      Chief Complaint  Patient presents with  . Flank Pain    (Consider location/radiation/quality/duration/timing/severity/associated sxs/prior treatment) HPI Patient presents with complaint of left flank pain. She states the pain began 2 days ago and worsened today. There is some radiation to her left side of her mid abdomen. She states that the pain feels similar to prior kidney stones. She denies any fever or chills as well as no vomiting however she has had some mild nausea. She denies any dysuria or gross blood in her urine. She has had no fall or other injury. There are no other associated systemic symptoms, there are no alleviating or modifying factors, she has had no treatment for symptoms prior to arrival.  Pain is constant and moderate in nature.    Past Medical History  Diagnosis Date  . Personal history of colonic polyps   . Acute upper respiratory infections of unspecified site   . Atrial fibrillation   . Allergic rhinitis, cause unspecified   . Unspecified asthma   . Personal history of other diseases of digestive system   . Personal history of urinary calculi   . Osteoporosis, unspecified   . Osteoarthrosis, unspecified whether generalized or localized, unspecified site   . Unspecified essential hypertension   . Other and unspecified hyperlipidemia   . Anxiety state, unspecified     Past Surgical History  Procedure Date  . Appendectomy   . Tonsillectomy   . Hemicolectomy     right    Family History  Problem Relation Age of Onset  . Heart disease Mother   . Heart disease Father     History  Substance Use Topics  . Smoking status: Never Smoker   . Smokeless tobacco: Not on file  . Alcohol Use: No    OB History    Grav Para Term Preterm Abortions TAB SAB Ect Mult Living                  Review of Systems ROS reviewed and all  otherwise negative except for mentioned in HPI  Allergies  Atorvastatin; Morphine sulfate; Penicillins; Prednisone; and Sulfonamide derivatives  Home Medications   Current Outpatient Rx  Name Route Sig Dispense Refill  . ACETAMINOPHEN 325 MG PO TABS Oral Take 650 mg by mouth every 6 (six) hours as needed. Patient used this medication for her pain.    . ALBUTEROL SULFATE HFA 108 (90 BASE) MCG/ACT IN AERS Inhalation Inhale 2 puffs into the lungs every 6 (six) hours as needed. For shortness of breath    . ALPRAZOLAM 0.25 MG PO TABS Oral Take 1 tablet (0.25 mg total) by mouth at bedtime as needed. 90 tablet 0  . METOPROLOL TARTRATE 50 MG PO TABS Oral Take 50 mg by mouth 2 (two) times daily.      Marland Kitchen RALOXIFENE HCL 60 MG PO TABS Oral Take 60 mg by mouth daily.      Ival Bible MV MISC  Use as instructed 1 each 0  . VALSARTAN 320 MG PO TABS Oral Take 320 mg by mouth daily.      Marland Kitchen VERAPAMIL HCL ER 240 MG PO CP24 Oral Take 240 mg by mouth daily.      . WARFARIN SODIUM 2.5 MG PO TABS Oral Take 1.25-2.5 mg by mouth daily. Patient alternates every other day with 1.25mg   to 2.5mg .    . BECLOMETHASONE DIPROPIONATE 80 MCG/ACT IN AERS Inhalation Inhale 1 puff into the lungs as needed. For shortness of breath    . CIPROFLOXACIN HCL 500 MG PO TABS Oral Take 1 tablet (500 mg total) by mouth every 12 (twelve) hours. 14 tablet 0    BP 158/81  Pulse 103  Temp(Src) 98.1 F (36.7 C) (Oral)  Resp 20  Ht 5\' 7"  (1.702 m)  Wt 160 lb (72.576 kg)  BMI 25.06 kg/m2  SpO2 98% Vitals reviewed Physical Exam Physical Examination: General appearance - alert, well appearing, and in no distress Mental status - alert, oriented to person, place, and time Eyes - no conjunctival injection or scleral icterus Mouth - mucous membranes moist, pharynx normal without lesions Chest - clear to auscultation, no wheezes, rales or rhonchi, symmetric air entry Heart - normal rate, regular rhythm, normal S1, S2, no murmurs, rubs,  clicks or gallops Abdomen - soft, nontender, nondistended, no masses or organomegaly, nabs Back exam - no midline spinal tenderness, no CVA tenderness Extremities - peripheral pulses normal, no pedal edema, no clubbing or cyanosis Skin - normal coloration and turgor, no rashes, brisk cap refill  ED Course  Procedures (including critical care time)  Labs Reviewed  URINALYSIS, ROUTINE W REFLEX MICROSCOPIC - Abnormal; Notable for the following:    Leukocytes, UA SMALL (*)    All other components within normal limits  URINE MICROSCOPIC-ADD ON - Abnormal; Notable for the following:    Bacteria, UA MANY (*)    Casts HYALINE CASTS (*)    All other components within normal limits  BASIC METABOLIC PANEL - Abnormal; Notable for the following:    Glucose, Bld 116 (*)    GFR calc non Af Amer 50 (*)    GFR calc Af Amer 58 (*)    All other components within normal limits  CBC - Abnormal; Notable for the following:    Platelets 120 (*)    All other components within normal limits  URINE CULTURE   Ct Abdomen Pelvis Wo Contrast  12/13/2011  *RADIOLOGY REPORT*  Clinical Data: Left flank pain  CT ABDOMEN AND PELVIS WITHOUT CONTRAST  Technique:  Multidetector CT imaging of the abdomen and pelvis was performed following the standard protocol without intravenous contrast.  Comparison: 07/31/2008 and 08/11/2008  Findings: Lung bases are unremarkable.  Cardiomegaly is noted. Atherosclerotic calcifications of the coronary arteries. Atherosclerotic calcifications of the abdominal aorta, bilateral renal artery origin and the iliac arteries.  Unenhanced liver is unremarkable.  No calcified gallstones are noted within gallbladder.  Tiny punctate calcifications probable vascular in nature are noted in the upper pole of the left kidney. Probable nonobstructive calculus in the upper pole of the left kidney measures 1.4 mm.  At least five cysts are again identified within left kidney.  The largest cyst in the upper pole  measures 2.3 cm stable from prior exam.  Largest cyst in lower pole measures 2.1 cm stable from prior exam. Two of the cysts  in  upper pole have slight higher density measuring 15 and 11 HU probable due to proteinaceous or hemorrhagic material.  No calcified calculi are noted bilateral ureter.  No hydronephrosis or hydroureter.  No aortic aneurysm.  There is no small bowel obstruction.  No ascites or free air.  No adenopathy.  There is no pericecal inflammation.  The urinary bladder is unremarkable.  Bilateral distal ureter is unremarkable.  Diffuse osteopenia is noted.  No destructive bony lesions are noted.  Sagittal images of the spine are unremarkable.  IMPRESSION:  1.  Stable in size is left renal cysts. Two of the cysts  in  upper pole have slight higher density measuring 15 and 11 HU probable due to proteinaceous or hemorrhagic material. 2.  Probable punctate left nonobstructive nephrolithiasis.  No hydronephrosis or hydroureter.  No ureteral calculi are noted. 3.  No small bowel obstruction. 4.  Atherosclerotic vascular calcifications. 5.  No pericecal inflammation.  Original Report Authenticated By: Natasha Mead, M.D.     1. Urinary tract infection       MDM  Pt presenting with c/o left flank pain.  No fever/chills or abdominal pain or tenderness.  Pt states she feels similar to when she had renal stones in the past.  Labs reassuring, UA shows + WBCS/bacteria.  Urine culture sent.  CT scan shows no sign of renal stone or other acute abnormality.  Pt started on po cipro.  She has refused other pain /nausea medications.  Discharged with strict return precautions.  Pt agreeable with plan.  Pt was advised to have her INR checked more frequently due to being on ciprofloxacin- pt has allergy to penicillins which precludes giving keflex, also allergy to sulfa drugs.          Ethelda Chick, MD 12/14/11 [phone]

## 2011-12-15 LAB — URINE CULTURE
Colony Count: NO GROWTH
Culture: NO GROWTH

## 2011-12-21 ENCOUNTER — Ambulatory Visit: Payer: Medicare Other | Admitting: Family Medicine

## 2011-12-26 ENCOUNTER — Ambulatory Visit: Payer: Medicare Other | Admitting: Internal Medicine

## 2012-01-02 ENCOUNTER — Ambulatory Visit (INDEPENDENT_AMBULATORY_CARE_PROVIDER_SITE_OTHER): Payer: Medicare Other | Admitting: Family Medicine

## 2012-01-02 ENCOUNTER — Encounter: Payer: Self-pay | Admitting: Family Medicine

## 2012-01-02 VITALS — BP 149/74 | HR 77 | Temp 97.8°F | Ht 67.0 in | Wt 165.0 lb

## 2012-01-02 DIAGNOSIS — Q619 Cystic kidney disease, unspecified: Secondary | ICD-10-CM

## 2012-01-02 DIAGNOSIS — N390 Urinary tract infection, site not specified: Secondary | ICD-10-CM

## 2012-01-02 DIAGNOSIS — N281 Cyst of kidney, acquired: Secondary | ICD-10-CM

## 2012-01-02 DIAGNOSIS — Z87442 Personal history of urinary calculi: Secondary | ICD-10-CM

## 2012-01-02 LAB — POCT URINALYSIS DIPSTICK
Blood, UA: NEGATIVE
Protein, UA: NEGATIVE
Spec Grav, UA: 1.025
Urobilinogen, UA: 0.2
pH, UA: 6

## 2012-01-02 NOTE — Patient Instructions (Signed)
If your symptoms return let us know.

## 2012-01-02 NOTE — Progress Notes (Signed)
  Subjective:    Patient ID: AMA MCMASTER, female    DOB: [birth year], 78 y.o.   MRN: [id number]  HPI Micah Flesher to ED for pelvic pain/back pain and went to [facility] ED. CT was stable.  tx her for URI with cipro BID x 7 days. She compled the course.  Hx of renal cysts.  Not new. She feels a lot better.  Tolerated ABX well.  No more pain.  NO fever.     Review of Systems     Objective:   Physical Exam  Constitutional: She is oriented to person, place, and time. She appears well-developed and well-nourished.  HENT:  Head: Normocephalic and atraumatic.  Cardiovascular: Normal rate and normal heart sounds.        Irreg rhythm.   Pulmonary/Chest: Effort normal and breath sounds normal.  Abdominal: Soft. Bowel sounds are normal. She exhibits no distension and no mass. There is no tenderness. There is no rebound and no guarding.  Neurological: She is alert and oriented to person, place, and time.  Skin: Skin is warm and dry.  Psychiatric: She has a normal mood and affect. Her behavior is normal.          Assessment & Plan:  UTI - UA is neg.  Infection has  Cleared.  Call if any recurrent sxs. Final urine culture from ED was neg. she also had a CT performed in the ED showing renal cysts. She says she's well aware of this and is followed by Dr. Roselee Nova in [place] for this. He is told her that these are benign and that they do not need further workup. She also has a kidney stone but is not causing any problems or obstruction currently.

## 2012-01-08 ENCOUNTER — Other Ambulatory Visit: Payer: Self-pay | Admitting: Internal Medicine

## 2012-02-14 ENCOUNTER — Other Ambulatory Visit: Payer: Self-pay | Admitting: *Deleted

## 2012-02-14 MED ORDER — ALPRAZOLAM 0.25 MG PO TABS: 0.2500 mg | ORAL_TABLET | Freq: Every evening | ORAL | Status: DC | PRN

## 2012-04-24 ENCOUNTER — Encounter: Payer: Self-pay | Admitting: Internal Medicine

## 2012-04-24 ENCOUNTER — Ambulatory Visit (INDEPENDENT_AMBULATORY_CARE_PROVIDER_SITE_OTHER): Payer: 59 | Admitting: Internal Medicine

## 2012-04-24 VITALS — BP 118/66 | HR 74 | Temp 98.0°F | Wt 163.0 lb

## 2012-04-24 DIAGNOSIS — S8990XA Unspecified injury of unspecified lower leg, initial encounter: Secondary | ICD-10-CM

## 2012-04-24 DIAGNOSIS — Z23 Encounter for immunization: Secondary | ICD-10-CM

## 2012-04-24 NOTE — Progress Notes (Signed)
  Subjective:    Patient ID: Joanna Salas, female    DOB: [birth year], 78 y.o.   MRN: [id number]  HPI Acute visit 4 days ago she hit her left leg with a corner of her car door, she bled, bleeding stopped with pressure within few minutes. Here because he is concerned about getting a tetanus shot.  Past Medical History  Diagnosis Date  . Personal history of colonic polyps   . Acute upper respiratory infections of unspecified site   . Atrial fibrillation   . Allergic rhinitis, cause unspecified   . Unspecified asthma   . Personal history of other diseases of digestive system   . Personal history of urinary calculi   . Osteoporosis, unspecified   . Osteoarthrosis, unspecified whether generalized or localized, unspecified site   . Unspecified essential hypertension   . Other and unspecified hyperlipidemia   . Anxiety state, unspecified    Past Surgical History  Procedure Date  . Appendectomy   . Tonsillectomy   . Hemicolectomy     right     Review of Systems No fever or chills Very mild pain at the area of the injury  Current Outpatient Rx  Name Route Sig Dispense Refill  . ACETAMINOPHEN 325 MG PO TABS Oral Take 650 mg by mouth every 6 (six) hours as needed. Patient used this medication for her pain.    Marland Kitchen ALPRAZOLAM 0.25 MG PO TABS Oral Take 1 tablet (0.25 mg total) by mouth at bedtime as needed. 90 tablet 0  . METOPROLOL TARTRATE 50 MG PO TABS Oral Take 50 mg by mouth 2 (two) times daily.      Marland Kitchen RALOXIFENE HCL 60 MG PO TABS Oral Take 60 mg by mouth daily.      Marland Kitchen VALSARTAN 320 MG PO TABS Oral Take 320 mg by mouth daily.      Marland Kitchen VERAPAMIL HCL ER 240 MG PO CP24 Oral Take 240 mg by mouth daily.      . WARFARIN SODIUM 2.5 MG PO TABS Oral Take 1.25-2.5 mg by mouth daily. Patient alternates every other day with 1.25mg  to 2.5mg .    . BECLOMETHASONE DIPROPIONATE 80 MCG/ACT IN AERS Inhalation Inhale 1 puff into the lungs as needed. For shortness of breath    . PROAIR HFA 108 (90  BASE) MCG/ACT IN AERS  USE 2 PUFFS 4 TIMES DAILY AS NEEDED FOR RESCUE INHALER 8.5 g 1  . AEROCHAMBER MV MISC  Use as instructed 1 each 0        Objective:   Physical Exam  Constitutional: She appears well-developed and well-nourished. No distress.  Skin: She is not diaphoretic.          Assessment & Plan:   Superficial wound, left leg. No evidence of infection at this point, patient takes Coumadin but there is no acute bleeding at this time. Plan: Tdap Keep the area cover or and use an antibiotic ointment Discussed with the patient signs of infection that should prompt visit.  Patient likes to reestablish with this practice, recommend to schedule a followup appointment at her earliest convenience.

## 2012-04-26 ENCOUNTER — Telehealth: Payer: Self-pay | Admitting: Internal Medicine

## 2012-04-26 MED ORDER — ALPRAZOLAM 0.25 MG PO TABS: 0.2500 mg | ORAL_TABLET | Freq: Every evening | ORAL | Status: DC | PRN

## 2012-04-26 NOTE — Telephone Encounter (Signed)
Ok to refill 

## 2012-04-26 NOTE — Telephone Encounter (Signed)
Pt called triage line lm at 149pm stated when she was in dr Drue Novel was going to send her nerve pills into the pharmacy but the pharmacy says they have not gotten yet. Pt did not leave a callback # Below is what I assume she needs ph# in demographics [phone]  ALPRAZolam (Tab) XANAX 0.25 MG Take 1 tablet (0.25 mg total) by mouth at bedtime as needed.

## 2012-04-26 NOTE — Telephone Encounter (Signed)
Done

## 2012-04-29 ENCOUNTER — Telehealth: Payer: Self-pay | Admitting: Internal Medicine

## 2012-04-29 NOTE — Telephone Encounter (Signed)
Please see if patient can come in on Wed 05-01-12 at 915am or 05-02-12 at 1015am with CY. Thanks.

## 2012-04-29 NOTE — Telephone Encounter (Signed)
I spoke with pt and she stated her asthma is acting up since the leaves have been falling and ragweed has been out. C/o lots of wheezing, slight dry cough in the AM, slight SOB at rest and with activity. She is requesting to be seen this week by Dr. Maple Hudson. Nothing available. Please advise Dr. Maple Hudson thanks

## 2012-04-29 NOTE — Telephone Encounter (Signed)
Spoke with patient-will be here Wed 05-01-12 at 915am to see Joanna Salas.

## 2012-05-01 ENCOUNTER — Encounter: Payer: Self-pay | Admitting: Internal Medicine

## 2012-05-01 ENCOUNTER — Ambulatory Visit (INDEPENDENT_AMBULATORY_CARE_PROVIDER_SITE_OTHER): Payer: Medicare Other | Admitting: Internal Medicine

## 2012-05-01 VITALS — BP 160/80 | HR 102 | Ht 67.0 in | Wt 165.8 lb

## 2012-05-01 DIAGNOSIS — J45909 Unspecified asthma, uncomplicated: Secondary | ICD-10-CM

## 2012-05-01 DIAGNOSIS — J309 Allergic rhinitis, unspecified: Secondary | ICD-10-CM

## 2012-05-01 MED ORDER — METHYLPREDNISOLONE ACETATE 80 MG/ML IJ SUSP
80.0000 mg | Freq: Once | INTRAMUSCULAR | Status: AC
  Administered 2012-05-01: 80 mg via INTRAMUSCULAR

## 2012-05-01 MED ORDER — LEVALBUTEROL HCL 0.63 MG/3ML IN NEBU
0.6300 mg | INHALATION_SOLUTION | Freq: Once | RESPIRATORY_TRACT | Status: AC
  Administered 2012-05-01: 0.63 mg via RESPIRATORY_TRACT

## 2012-05-01 MED ORDER — AZELASTINE-FLUTICASONE 137-50 MCG/ACT NA SUSP: 2.0000 | Freq: Every day | NASAL | Status: DC

## 2012-05-01 NOTE — Patient Instructions (Addendum)
Neb xop 0.63  Depo 80  Sample Dymista nasal spray    1-2 puffs each nostril once every night at bedtime  I do recommend the flu shot for you, if you change your mind.

## 2012-05-01 NOTE — Progress Notes (Signed)
06/23/11- Joanna Salas never smoker followed for asthma, rhinitis, complicated by A. fib, HBP LOV-10/14/2010 She declines flu shot but had pneumonia vaccine. For the past week has intermittent chest congestion and morning cough, some blood in mucus from right nostril. If she hurries she gets short of breath and in the winter she will wheeze. She does not think she has a cold. Denies chest pain, swelling, fever. Husband turns the heat up in the home much too high for her comfort. She is confused about the roles of Qvar and her rescue inhaler.  08/28/11- Joanna Salas never smoker followed for asthma, rhinitis, complicated by A. fib, HBP   Had declined flu vax  acute illness-1 week , began with left maxillary and left ear pressure pain, sore throat. She had taken a Medrol taper which opened her and she began coughing out green phlegm. We gave Z-Pak January 8. She blamed Medrol for nausea and vomiting . Now nasal congestion, post nasal drip. Chest is no longer tight. Sputum is clear, no fever or pain.  05/01/12- Joanna Salas never smoker followed for asthma, rhinitis, complicated by A. fib, HBP Dry cough and wheezing; has been doing good until having to do yard work; QVAR causing sore throat-doesnt use Aerochamber with it. Blames pollen while working outdoors over the last 2 weeks for increased nasal congestion with recent loud snore. In the morning has raspy cough and wheeze. Using her rescue inhaler once each morning. Does not think she has a cold. Has never had a flu shot. I advised we start but she deferred.  ROS-see HPI Constitutional:   No-   weight loss, night sweats, fevers, chills, fatigue, lassitude. HEENT:   No-  headaches, difficulty swallowing, tooth/dental problems, sore throat,       No-  sneezing, itching, ear ache,  +nasal congestion, post nasal drip,  CV:  No-   chest pain, orthopnea, PND, swelling in lower extremities, anasarca, dizziness, palpitations Resp: +  shortness of breath with exertion or at  rest.              +  productive cough,  + non-productive cough,  No- coughing up of blood.              No-   change in color of mucus.  + wheezing.   Skin: No-   rash or lesions. GI:  No-   heartburn, indigestion, abdominal pain, nausea, vomiting,  GU: MS:  No-   joint pain or swelling.   Neuro-     nothing unusual Psych:  No- change in mood or affect. No depression or anxiety.  No memory loss.  OBJ General- Alert, Oriented, Affect-appropriate, Distress- none acute Skin- rash-none, lesions- none, excoriation- none Lymphadenopathy- none Head- atraumatic            Eyes- Gross vision intact, PERRLA, conjunctivae clear secretions            Ears- Hearing, canals-normal            Nose- + turbinate edema, no-Septal dev, mucus, polyps, erosion, perforation             Throat- Mallampati II , mucosa clear , drainage- none, tonsils- atrophic; dentures Neck- flexible , trachea midline, no stridor , thyroid nl, carotid no bruit Chest - symmetrical excursion , unlabored           Heart/CV- nearly RRR , no murmur , no gallop  , no rub, nl s1 s2                           -  JVD- none , edema- none, stasis changes- none, varices- none           Lung- +not wheezing now , dullness-none, rub- none           Chest wall-  Abd- Br/ Gen/ Rectal- Not done, not indicated Extrem- cyanosis- none, clubbing, none, atrophy- none, strength- nl Neuro- grossly intact to observation

## 2012-05-10 DIAGNOSIS — J45909 Unspecified asthma, uncomplicated: Secondary | ICD-10-CM | POA: Insufficient documentation

## 2012-05-10 NOTE — Assessment & Plan Note (Signed)
Increased nasal congestion while working outside in ragweed season would be consistent with a seasonal allergic rhinitis. Plan-sample Dymista nasal spray

## 2012-05-10 NOTE — Assessment & Plan Note (Signed)
Recent exacerbation is likely allergic. Plan-Depo-Medrol, nebulized Xopenex She has an AeroChamber and is encouraged to use it with her Qvar steroid inhaler.

## 2012-05-20 ENCOUNTER — Ambulatory Visit (INDEPENDENT_AMBULATORY_CARE_PROVIDER_SITE_OTHER): Payer: Medicare Other | Admitting: Internal Medicine

## 2012-05-20 ENCOUNTER — Encounter: Payer: Self-pay | Admitting: Internal Medicine

## 2012-05-20 VITALS — BP 128/78 | HR 88 | Temp 97.9°F | Wt 157.0 lb

## 2012-05-20 DIAGNOSIS — M1990 Unspecified osteoarthritis, unspecified site: Secondary | ICD-10-CM

## 2012-05-20 DIAGNOSIS — N390 Urinary tract infection, site not specified: Secondary | ICD-10-CM

## 2012-05-20 LAB — POCT URINALYSIS DIPSTICK
Blood, UA: NEGATIVE
Ketones, UA: NEGATIVE
Spec Grav, UA: 1.01
pH, UA: 7.5

## 2012-05-20 NOTE — Patient Instructions (Addendum)
Drink plenty of fluids, call if you get worse, call if you have fever, burning with urination, blood in the urine. We are checking a urine culture --- For pain at the left hip, take Tylenol  500 mg OTC 2 tabs a day every 8 hours as needed for pain Call if you get worse ----- Please schedule a routine visit at your earliest convenience

## 2012-05-20 NOTE — Assessment & Plan Note (Signed)
Pain at the left hip, crest. Recommend conservative treatment for now, Tylenol. See instructions. If she's not improving, will do  x-rays.

## 2012-05-20 NOTE — Progress Notes (Signed)
  Subjective:    Patient ID: Joanna Salas, female    DOB: 25-Jun-1925, 78 y.o.   MRN: [id number]  HPI Acute visit. One history of urinary frequency, nocturia x4. She also has mild lower abdominal pressure that is released by urination. Also, she started to do some yard work the last few months, since then is having pain at the L hip, she actually points to the left crest anterioly  Past Medical History  Diagnosis Date  . Personal history of colonic polyps   . Atrial fibrillation   . Allergic rhinitis, cause unspecified   . Unspecified asthma   . Personal history of other diseases of digestive system   . Personal history of urinary calculi   . Osteoporosis, unspecified   . Osteoarthrosis, unspecified whether generalized or localized, unspecified site   . Unspecified essential hypertension   . Other and unspecified hyperlipidemia   . Anxiety state, unspecified    Past Surgical History  Procedure Date  . Appendectomy   . Tonsillectomy   . Hemicolectomy     right     Review of Systems No fever or chills No nausea, vomiting, diarrhea No actual dysuria or gross hematuria.     Objective:   Physical Exam General -- alert, well-developed, and overweight appearing. No apparent distress.   Abdomen--soft, non-tender, no distention, no masses, no HSM, no guarding, and no rigidity. No CVA tenderness.  Extremities-- no pretibial edema bilaterally ;  hip rotation normal bilaterally but it did elicit some pain on the left. Palpation of the trochanteric bursa on either side negative, no tenderness at the crest    Assessment & Plan:   UTIs? Symptoms suggest UTI, urinalysis showed trace leukocytes. Will wait for a ucx before antibiotics

## 2012-05-21 ENCOUNTER — Telehealth: Payer: Self-pay

## 2012-05-21 NOTE — Telephone Encounter (Signed)
Discussed with pt

## 2012-05-21 NOTE — Telephone Encounter (Signed)
unless she has fever chills or increased symptoms, I prefer to wait for the urine culture to give her a more targeted antibiotic

## 2012-05-21 NOTE — Telephone Encounter (Signed)
Pt states appt yesterday done a urine culture at that time you advised pt she had a UTI. Pt wants to know what medication you are going to put her on. I looked at labs but didn't see any result notes. Maybe to soon done 05/20/12.Plz advise     MW

## 2012-05-22 ENCOUNTER — Telehealth: Payer: Self-pay | Admitting: Internal Medicine

## 2012-05-22 LAB — URINE CULTURE: Colony Count: 75000

## 2012-05-22 NOTE — Telephone Encounter (Signed)
i did not send it.

## 2012-05-22 NOTE — Telephone Encounter (Signed)
Urine culture contaminated, plan: Bring another sample tomorrow, send if for a urine culture, dx UTI Start empiric ciprofloxacin 250 mg one by mouth twice a day, #6 no refills. Start antibiotic after the urine culture is collected

## 2012-05-22 NOTE — Telephone Encounter (Signed)
Spoke with pt & she states that she would rather go see her kidney specialist tomorrow instead of bringing in another sample.

## 2012-05-22 NOTE — Telephone Encounter (Signed)
See previous phone note.  

## 2012-05-22 NOTE — Telephone Encounter (Signed)
Pt upset and wants to know results of test and if she will be getting antibotic, pls call

## 2012-05-22 NOTE — Telephone Encounter (Signed)
Caller: Joanna Salas/Patient; Phone: [phone]; Reason for Call: Patient was seen on Monday for a UTI, states she had a culture done, calling today to get the results.  Please call patient back.  Thanks

## 2012-05-22 NOTE — Telephone Encounter (Signed)
Please advise 

## 2012-05-22 NOTE — Telephone Encounter (Signed)
Okay, please don't send the  antibiotic

## 2012-06-24 ENCOUNTER — Ambulatory Visit: Payer: Medicare Other | Admitting: Internal Medicine

## 2012-06-28 ENCOUNTER — Other Ambulatory Visit: Payer: Self-pay | Admitting: Family Medicine

## 2012-07-01 ENCOUNTER — Telehealth: Payer: Self-pay | Admitting: Internal Medicine

## 2012-07-01 MED ORDER — ALPRAZOLAM 0.25 MG PO TABS: 0.2500 mg | ORAL_TABLET | Freq: Every evening | ORAL | Status: DC | PRN

## 2012-07-01 NOTE — Telephone Encounter (Signed)
refill ALPRAZolam (Tab) 0.25 MG Take 1 tablet (0.25 mg total) by mouth at bedtime as needed #90 last fill 04/27/12--last ov 10.7.13 acute

## 2012-07-01 NOTE — Telephone Encounter (Signed)
Dr. Thurmond Butts is this ok to fill

## 2012-07-01 NOTE — Telephone Encounter (Signed)
Advise patient: needs to schedule a routine visit, we'll decide what labs are needed at the time. We rx 90 tablets of Xanax less than 3 months ago,  too early to refill.

## 2012-07-01 NOTE — Telephone Encounter (Signed)
i called the pharmacy just to clarify when the alprazolam was last picked up by the pt. The pharmacist stated that the pt last picked up the rx on 7.23.13. The pharmacy did not receive the rx we sent in on 9.18.13. OK to send in a refill?

## 2012-07-01 NOTE — Telephone Encounter (Signed)
Ok to refill alprazolam 0.25mg ?  What lab orders do i need to put in for the pt & dx?

## 2012-07-01 NOTE — Telephone Encounter (Signed)
pt called stated she was told at last visit she needed to have labs drawn, can you tell me what she needs and enter orders, I will call her back to schedule cb# [phone]

## 2012-07-01 NOTE — Telephone Encounter (Signed)
Rx sent to pharmacy. Please call pt to schedule OV & notify her that she can have her labs done then.

## 2012-07-01 NOTE — Telephone Encounter (Signed)
Refill done.  

## 2012-07-01 NOTE — Telephone Encounter (Signed)
Yes , 90, no RF

## 2012-07-02 NOTE — Telephone Encounter (Signed)
Called pt she is coming in 11.20.13 at 2pm

## 2012-07-03 ENCOUNTER — Ambulatory Visit (INDEPENDENT_AMBULATORY_CARE_PROVIDER_SITE_OTHER): Payer: Medicare Other | Admitting: Internal Medicine

## 2012-07-03 ENCOUNTER — Encounter: Payer: Self-pay | Admitting: Internal Medicine

## 2012-07-03 VITALS — BP 116/68 | HR 49 | Temp 97.8°F | Wt 161.0 lb

## 2012-07-03 DIAGNOSIS — M1990 Unspecified osteoarthritis, unspecified site: Secondary | ICD-10-CM

## 2012-07-03 DIAGNOSIS — J45909 Unspecified asthma, uncomplicated: Secondary | ICD-10-CM

## 2012-07-03 DIAGNOSIS — F411 Generalized anxiety disorder: Secondary | ICD-10-CM

## 2012-07-03 DIAGNOSIS — Z86010 Personal history of colonic polyps: Secondary | ICD-10-CM

## 2012-07-03 DIAGNOSIS — E785 Hyperlipidemia, unspecified: Secondary | ICD-10-CM

## 2012-07-03 DIAGNOSIS — M810 Age-related osteoporosis without current pathological fracture: Secondary | ICD-10-CM

## 2012-07-03 DIAGNOSIS — I10 Essential (primary) hypertension: Secondary | ICD-10-CM

## 2012-07-03 DIAGNOSIS — I4891 Unspecified atrial fibrillation: Secondary | ICD-10-CM

## 2012-07-03 NOTE — Assessment & Plan Note (Addendum)
On Evista, I not see any bone density test reports, pt states gyn did a DEXA last year, stable Recommend   vitamin D daily States was recommended not to take calcium due to history of renal stones

## 2012-07-03 NOTE — Assessment & Plan Note (Signed)
Well-controlled at the present time, takes alprazolam sporadically for insomnia.

## 2012-07-03 NOTE — Assessment & Plan Note (Signed)
Currently doing well, reports that only has some wheezing when she has a cold. The only inhaler she has is albuterol that she uses it when necessary. Strongly declined a flu shot, benefits discussed

## 2012-07-03 NOTE — Patient Instructions (Signed)
Take over-the-counter vitamin D between 6oo and 1200 units daily Stay active

## 2012-07-03 NOTE — Assessment & Plan Note (Signed)
Well-controlled, no change 

## 2012-07-03 NOTE — Progress Notes (Signed)
  Subjective:    Patient ID: Joanna Salas, female    DOB: April 18, 1925, 78 y.o.   MRN: [id number]  HPI Routine office visit Anxiety, well-controlled at the present time with Xanax which she takes  sporadically and only at night History of asthma, usually triggered by URIs, on proair as needed. Hypertension, good medication compliance, ambulatory BPs always less than 140/76 Evista for osteoporosis, good compliance.  Past Medical History  Diagnosis Date  . Personal history of colonic polyps   . Atrial fibrillation   . Allergic rhinitis, cause unspecified   . Unspecified asthma   . Personal history of other diseases of digestive system   . Personal history of urinary calculi   . Osteoporosis, unspecified   . DJD (degenerative joint disease)   . HTN (hypertension)   . Hyperlipidemia   . Anxiety state, unspecified    Past Surgical History  Procedure Date  . Appendectomy   . Tonsillectomy   . Hemicolectomy     right   History   Social History  . Marital Status: Married    Spouse Name: N/A    Number of Children: 2  . Years of Education: N/A   Occupational History  . retired     Education officer, [employer]  .      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 Smoking status: Never Smoker   . Smokeless tobacco: Never Used  . Alcohol Use: No  . Drug Use: No  . Sexually Active: Not on file   Other Topics Concern  . Not on file   Social History Narrative   [place] a son (MVA he was 82 y/o), lives w/ husband     Review of Systems No chest pain or shortness of breath No nausea, vomiting, diarrhea No lower extremity edema Denies any depression. pulse is slightly low today, denies any orthostatic dizziness or fatigue.     Objective:   Physical Exam  General -- alert, well-developed Lungs -- normal respiratory effort, no intercostal retractions, no accessory muscle use, and decreased breath sounds.   Heart-- bradycardic, no murmur  .   Abdomen--soft, non-tender, no  distention, no masses, no HSM, no guarding, and no rigidity.   Extremities-- no pretibial edema bilaterally  Neurologic-- alert & oriented X3 and strength normal in all extremities. Psych-- Cognition and judgment appear intact. Alert and cooperative with normal attention span and concentration.  not anxious appearing and not depressed appearing.      Assessment & Plan:

## 2012-07-03 NOTE — Assessment & Plan Note (Signed)
Colonoscopy 07/18/2008, had polyps, next in 5 year. GI is Dr. Jarold Motto. She is status post a right hemicolectomy.

## 2012-07-03 NOTE — Assessment & Plan Note (Signed)
Well-controlled with Tylenol as needed

## 2012-07-03 NOTE — Assessment & Plan Note (Signed)
On no medications, followup by cardiology

## 2012-07-03 NOTE — Assessment & Plan Note (Signed)
On Coumadin, followup by Dr. Allyson Sabal. Pulse today is around 50. She is asymptomatic. Plan-- no change

## 2012-07-04 ENCOUNTER — Encounter: Payer: Self-pay | Admitting: *Deleted

## 2012-07-04 LAB — BASIC METABOLIC PANEL
CO2: 30 mEq/L (ref 19–32)
Calcium: 8.8 mg/dL (ref 8.4–10.5)
Chloride: 104 mEq/L (ref 96–112)
Potassium: 3.9 mEq/L (ref 3.5–5.1)
Sodium: 140 mEq/L (ref 135–145)

## 2012-08-12 ENCOUNTER — Telehealth: Payer: Self-pay | Admitting: *Deleted

## 2012-08-21 NOTE — Telephone Encounter (Signed)
Error

## 2012-08-26 ENCOUNTER — Telehealth: Payer: Self-pay | Admitting: Internal Medicine

## 2012-08-26 MED ORDER — AZITHROMYCIN 250 MG PO TABS: ORAL_TABLET | ORAL | Status: DC

## 2012-08-26 NOTE — Telephone Encounter (Signed)
Per CY-okay to give Zpak #1 take as directed no refills.    LMTCB

## 2012-08-26 NOTE — Telephone Encounter (Signed)
Pt aware of CDY recs. She voiced her understanding. rx has been sent. Nothing further was needed

## 2012-08-26 NOTE — Telephone Encounter (Signed)
Spoke with pt and is having increase sinus drainage along with Sinus headache, having pm cough. Denies any fever. Requesting Something to be called in. \ Allergies  Allergen Reactions  . Atorvastatin     REACTION: unspecified  . Morphine Sulfate     REACTION: unspecified  . Penicillins     REACTION: unspecified  . Prednisone   . Sulfonamide Derivatives    Dr young please advise Thank you

## 2012-08-26 NOTE — Telephone Encounter (Signed)
Returning call can be reached at [phone].Joanna Salas

## 2012-09-11 ENCOUNTER — Telehealth: Payer: Self-pay | Admitting: Internal Medicine

## 2012-09-11 NOTE — Telephone Encounter (Signed)
We can call her in the AM to set up Nurse visit for neb xop 0.63 and depo 80.

## 2012-09-11 NOTE — Telephone Encounter (Signed)
I spoke with pt. She c/o "asthma wheezing" and feels SOB w/ activity x 1 week now. She is just wanting to come in for a neb tx. Please advise Dr. Maple Hudson thanks Last OV 05/01/12 Pending OV 10/30/12 Allergies  Allergen Reactions  . Atorvastatin     REACTION: unspecified  . Morphine Sulfate     REACTION: unspecified  . Penicillins     REACTION: unspecified  . Prednisone   . Sulfonamide Derivatives

## 2012-09-11 NOTE — Telephone Encounter (Signed)
I spoke with pt and is aware. She stated her husband has to be at the foot doctor at 10:15 AM. She will be free afterwards. Will forward to [facility]

## 2012-09-12 ENCOUNTER — Ambulatory Visit (INDEPENDENT_AMBULATORY_CARE_PROVIDER_SITE_OTHER): Payer: Medicare Other | Admitting: Adult Health

## 2012-09-12 ENCOUNTER — Encounter: Payer: Self-pay | Admitting: Adult Health

## 2012-09-12 VITALS — BP 128/72 | HR 90 | Temp 98.7°F | Ht 66.5 in | Wt 160.2 lb

## 2012-09-12 DIAGNOSIS — J45909 Unspecified asthma, uncomplicated: Secondary | ICD-10-CM

## 2012-09-12 MED ORDER — LEVALBUTEROL HCL 0.63 MG/3ML IN NEBU
0.6300 mg | INHALATION_SOLUTION | Freq: Once | RESPIRATORY_TRACT | Status: AC
  Administered 2012-09-12: 0.63 mg via RESPIRATORY_TRACT

## 2012-09-12 MED ORDER — METHYLPREDNISOLONE ACETATE 80 MG/ML IJ SUSP
80.0000 mg | Freq: Once | INTRAMUSCULAR | Status: AC
  Administered 2012-09-12: 80 mg via INTRAMUSCULAR

## 2012-09-12 NOTE — Assessment & Plan Note (Signed)
Mild flare  No further abx at this time.  Xopenex neb x 1  Depo Medrol 80mg  x 1   Plan  Mucinex DM Twice daily  As needed  Cough/congestion  Saline nasal rinses As needed   follow up with Dr. Maple Hudson  As planned  Please contact office for sooner follow up if symptoms do not improve or worsen or seek emergency care

## 2012-09-12 NOTE — Telephone Encounter (Signed)
Pt has been scheduled to see TP today at 2pm. Pt aware.

## 2012-09-12 NOTE — Telephone Encounter (Signed)
LMOMTCB x 1 

## 2012-09-12 NOTE — Progress Notes (Signed)
06/23/11- 86 yoF never smoker followed for asthma, rhinitis, complicated by A. fib, HBP LOV-10/14/2010 She declines flu shot but had pneumonia vaccine. For the past week has intermittent chest congestion and morning cough, some blood in mucus from right nostril. If she hurries she gets short of breath and in the winter she will wheeze. She does not think she has a cold. Denies chest pain, swelling, fever. Husband turns the heat up in the home much too high for her comfort. She is confused about the roles of Qvar and her rescue inhaler.  08/28/11- 86 yoF never smoker followed for asthma, rhinitis, complicated by A. fib, HBP   Had declined flu vax  acute illness-1 week , began with left maxillary and left ear pressure pain, sore throat. She had taken a Medrol taper which opened her and she began coughing out green phlegm. We gave Z-Pak January 8. She blamed Medrol for nausea and vomiting . Now nasal congestion, post nasal drip. Chest is no longer tight. Sputum is clear, no fever or pain.  05/01/12- 86 yoF never smoker followed for asthma, rhinitis, complicated by A. fib, HBP Dry cough and wheezing; has been doing good until having to do yard work; QVAR causing sore throat-doesnt use Aerochamber with it. Blames pollen while working outdoors over the last 2 weeks for increased nasal congestion with recent loud snore. In the morning has raspy cough and wheeze. Using her rescue inhaler once each morning. Does not think she has a cold. Has never had a flu shot. I advised we start but she deferred.  09/12/2012 Acute OV  Complains of wheezing, DOE x1 week .  Complains over the last 3 weeks that she has some nasal congestion, drainage, and cough. Initially called in a Z-Pak which helped with her cough/congestion /sinus symptoms, however, she continues to have a mild dry cough and intermittent wheezing.  Patient denies any discolored mucus, fever, or hemoptysis, orthopnea, PND, or unintentional weight loss. She  has had increased rescue inhaler use.   ROS-see HPI  Constitutional:   No  weight loss, night sweats,  Fevers, chills,  +fatigue, or  lassitude.  HEENT:   No headaches,  Difficulty swallowing,  Tooth/dental problems, or  Sore throat,                No sneezing, itching, ear ache, + nasal congestion, post nasal drip,   CV:  No chest pain,  Orthopnea, PND, swelling in lower extremities, anasarca, dizziness, palpitations, syncope.   GI  No heartburn, indigestion, abdominal pain, nausea, vomiting, diarrhea, change in bowel habits, loss of appetite, bloody stools.   Resp:   No coughing up of blood.   No chest wall deformity  Skin: no rash or lesions.  GU: no dysuria, change in color of urine, no urgency or frequency.  No flank pain, no hematuria   MS:  No joint pain or swelling.  No decreased range of motion.  No back pain.  Psych:  No change in mood or affect. No depression or anxiety.  No memory loss.     OBJ GEN: A/Ox3; pleasant , NAD, well nourished   HEENT:  [place]/AT,  EACs-clear, TMs-wnl, NOSE-clear drainage , THROAT-clear, no lesions, no postnasal drip or exudate noted.   NECK:  Supple w/ fair ROM; no JVD; normal carotid impulses w/o bruits; no thyromegaly or nodules palpated; no lymphadenopathy.  RESP  Clear  P & A; w/o, wheezes/ rales/ or rhonchi.no accessory muscle use, no dullness to percussion  CARD:  RRR, no m/r/g  , no peripheral edema, pulses intact, no cyanosis or clubbing.  GI:   Soft & nt; nml bowel sounds; no organomegaly or masses detected.  Musco: Warm bil, no deformities or joint swelling noted.   Neuro: alert, no focal deficits noted.    Skin: Warm, no lesions or rashes

## 2012-09-12 NOTE — Addendum Note (Signed)
Addended by: Boone Master E on: 09/12/2012 03:16 PM   Modules accepted: Orders

## 2012-09-12 NOTE — Patient Instructions (Addendum)
Mucinex DM Twice daily  As needed  Cough/congestion  Saline nasal rinses As needed   follow up with Dr. Maple Hudson  As planned  Please contact office for sooner follow up if symptoms do not improve or worsen or seek emergency care

## 2012-09-12 NOTE — Telephone Encounter (Signed)
Per CY-have patient come in to see TP today.

## 2012-10-29 ENCOUNTER — Ambulatory Visit: Payer: Self-pay | Admitting: Cardiovascular Disease

## 2012-10-29 DIAGNOSIS — Z7901 Long term (current) use of anticoagulants: Secondary | ICD-10-CM | POA: Insufficient documentation

## 2012-10-29 DIAGNOSIS — I4891 Unspecified atrial fibrillation: Secondary | ICD-10-CM

## 2012-10-30 ENCOUNTER — Ambulatory Visit: Payer: Medicare Other | Admitting: Internal Medicine

## 2012-11-20 ENCOUNTER — Encounter: Payer: Self-pay | Admitting: Internal Medicine

## 2012-11-20 ENCOUNTER — Ambulatory Visit (INDEPENDENT_AMBULATORY_CARE_PROVIDER_SITE_OTHER): Payer: Medicare Other | Admitting: Internal Medicine

## 2012-11-20 VITALS — BP 132/82 | HR 104 | Ht 66.0 in | Wt 159.6 lb

## 2012-11-20 DIAGNOSIS — J45909 Unspecified asthma, uncomplicated: Secondary | ICD-10-CM

## 2012-11-20 DIAGNOSIS — J309 Allergic rhinitis, unspecified: Secondary | ICD-10-CM

## 2012-11-20 DIAGNOSIS — J3089 Other allergic rhinitis: Secondary | ICD-10-CM

## 2012-11-20 MED ORDER — METHYLPREDNISOLONE ACETATE 80 MG/ML IJ SUSP
80.0000 mg | Freq: Once | INTRAMUSCULAR | Status: AC
  Administered 2012-11-20: 80 mg via INTRAMUSCULAR

## 2012-11-20 MED ORDER — ALBUTEROL SULFATE HFA 108 (90 BASE) MCG/ACT IN AERS: 2.0000 | INHALATION_SPRAY | RESPIRATORY_TRACT | Status: DC | PRN

## 2012-11-20 MED ORDER — LEVALBUTEROL HCL 0.63 MG/3ML IN NEBU
0.6300 mg | INHALATION_SOLUTION | Freq: Once | RESPIRATORY_TRACT | Status: AC
  Administered 2012-11-20: 0.63 mg via RESPIRATORY_TRACT

## 2012-11-20 NOTE — Patient Instructions (Addendum)
Neb xop 0.63  Depo 80  Script to refill [employer]

## 2012-11-20 NOTE — Progress Notes (Signed)
06/23/11- 86 yoF never smoker followed for asthma, rhinitis, complicated by A. fib, HBP LOV-10/14/2010 She declines flu shot but had pneumonia vaccine. For the past week has intermittent chest congestion and morning cough, some blood in mucus from right nostril. If she hurries she gets short of breath and in the winter she will wheeze. She does not think she has a cold. Denies chest pain, swelling, fever. Husband turns the heat up in the home much too high for her comfort. She is confused about the roles of Qvar and her rescue inhaler.  08/28/11- 86 yoF never smoker followed for asthma, rhinitis, complicated by A. fib, HBP   Had declined flu vax  acute illness-1 week , began with left maxillary and left ear pressure pain, sore throat. She had taken a Medrol taper which opened her and she began coughing out green phlegm. We gave Z-Pak January 8. She blamed Medrol for nausea and vomiting . Now nasal congestion, post nasal drip. Chest is no longer tight. Sputum is clear, no fever or pain.  05/01/12- 86 yoF never smoker followed for asthma, rhinitis, complicated by A. fib, HBP Dry cough and wheezing; has been doing good until having to do yard work; QVAR causing sore throat-doesnt use Aerochamber with it. Blames pollen while working outdoors over the last 2 weeks for increased nasal congestion with recent loud snore. In the morning has raspy cough and wheeze. Using her rescue inhaler once each morning. Does not think she has a cold. Has never had a flu shot. I advised we start but she deferred.  09/12/2012 Acute OV  Complains of wheezing, DOE x1 week .  Complains over the last 3 weeks that she has some nasal congestion, drainage, and cough. Initially called in a Z-Pak which helped with her cough/congestion /sinus symptoms, however, she continues to have a mild dry cough and intermittent wheezing.  Patient denies any discolored mucus, fever, or hemoptysis, orthopnea, PND, or unintentional weight loss. She  has had increased rescue inhaler use.  11/20/12- 87 yoF never smoker followed for asthma, rhinitis, complicated by A. fib, HBP FOLLOWS FOR: having chest congestion/wheezing-feels stuck in throat area-would like to know if she needs neb tx. Watery rhinorrhea, wheezing and shortness of breath the past 2 or 3 days. Doesn't think it's a cold. Taking Zyrtec. Dymista nasal spray did not help. Rescue inhaler works well. Husband died of heart disease/CHF in 2022-10-28.  ROS-see HPI Constitutional:   No-   weight loss, night sweats, fevers, chills, fatigue, lassitude. HEENT:   No-  headaches, difficulty swallowing, tooth/dental problems, sore throat,       No-  sneezing, itching, ear ache, +nasal congestion, +post nasal drip,  CV:  No-   chest pain, orthopnea, PND, swelling in lower extremities, anasarca,                                  dizziness, palpitations Resp: +  shortness of breath with exertion or at rest.              No-   productive cough,  No non-productive cough,  No- coughing up of blood.              No-   change in color of mucus.  + wheezing.   Skin: No-   rash or lesions. GI:  No-   heartburn, indigestion, abdominal pain, nausea, vomiting, GU:  MS:  No-   joint  pain or swelling.. Neuro-     nothing unusual Psych:  No- change in mood or affect. No depression or anxiety.  No memory loss.  OBJ- Physical Exam General- Alert, Oriented, Affect-appropriate, Distress- none acute Skin- rash-none, lesions- none, excoriation- none Lymphadenopathy- none Head- atraumatic            Eyes- Gross vision intact, PERRLA, conjunctivae and secretions clear            Ears- Hearing, canals-normal            Nose- +mucus bridging, no-Septal dev,  polyps, erosion, perforation             Throat- Mallampati II , mucosa clear , drainage- none, tonsils- atrophic.+ Dentures Neck- flexible , trachea midline, no stridor , thyroid nl, carotid no bruit Chest - symmetrical excursion , unlabored            Heart/CV- RRR-regular on exam today , no murmur , no gallop  , no rub, nl s1 s2                           - JVD- none , edema- none, stasis changes- none, varices- none           Lung- clear to P&A, wheeze- none, cough- none , dullness-none, rub- none           Chest wall-  Abd-  Br/ Gen/ Rectal- Not done, not indicated Extrem- cyanosis- none, clubbing, none, atrophy- none, strength- nl Neuro- grossly intact to observation

## 2012-11-25 ENCOUNTER — Telehealth: Payer: Self-pay | Admitting: Internal Medicine

## 2012-11-25 NOTE — Telephone Encounter (Signed)
Spoke with pt and notified of recs per CDY She now states that she is not even taking allegra due to her heart problems She is taking zyrtec Can this be taken bid? Please advise thanks

## 2012-11-25 NOTE — Telephone Encounter (Signed)
Spoke with patient informed her of recs as listed below per CY. Patient verbalized understanding and nothing further needed at this time.

## 2012-11-25 NOTE — Telephone Encounter (Signed)
Per CY-yes occasionally.

## 2012-11-25 NOTE — Telephone Encounter (Signed)
Per CY-1) for a couple of days occasionally she could take Allegra BID 2) use her rescue inhaler up to QID prn.

## 2012-11-25 NOTE — Telephone Encounter (Signed)
Spoke with pt She is c/o sneezing, wheezing, and dry cough x 2 days She states that she forgot to take her allegra last night, took it this am, and now she is some better  She states her symptoms bother her most first thing in the am Any suggestions for her? Last ov 11/21/11  Next ov 10/814 Allergies  Allergen Reactions  . Pneumococcal Vaccines   . Atorvastatin Other (See Comments)    Severe Leg Cramps  . Morphine Sulfate Nausea Only  . Penicillins     REACTION: unspecified  . Prednisone   . Sulfonamide Derivatives Nausea Only

## 2012-11-27 ENCOUNTER — Telehealth: Payer: Self-pay | Admitting: Internal Medicine

## 2012-11-27 MED ORDER — AZITHROMYCIN 250 MG PO TABS: ORAL_TABLET | ORAL | Status: DC

## 2012-11-27 MED ORDER — PREDNISONE 10 MG PO TABS: ORAL_TABLET | ORAL | Status: DC

## 2012-11-27 NOTE — Telephone Encounter (Signed)
Offer prednisone taper  10 mg, # 20, 4 X 2 DAYS, 3 X 2 DAYS, 2 X 2 DAYS, 1 X 2 DAYS          And Z pak

## 2012-11-27 NOTE — Telephone Encounter (Signed)
I spoke with pt. She c/o increase SOB, wheezing, cough (has only coughed up mucus only twice yellow-green phlem), chest congestion, nasal congestion, PND, chills x 2 days. No f/s/n/v. Her cough is worse at night. Taking zyrtec daily. Per pt she has not sleep in 2 nights d/t wheezing and coughing. Please advise Dr. Maple Hudson thanks Last OV 11/20/12 Pending 05/21/13 Allergies  Allergen Reactions  . Pneumococcal Vaccines   . Atorvastatin Other (See Comments)    Severe Leg Cramps  . Morphine Sulfate Nausea Only  . Penicillins     REACTION: unspecified  . Prednisone   . Sulfonamide Derivatives Nausea Only

## 2012-11-27 NOTE — Telephone Encounter (Signed)
I spoke with pt and is aware of CDY recs. Rx has been called in. Nothing further was needed

## 2012-11-29 ENCOUNTER — Encounter: Payer: Self-pay | Admitting: Internal Medicine

## 2012-11-29 NOTE — Assessment & Plan Note (Signed)
Acute seasonal exacerbation of rhinitis with asthma/bronchitis. Plan-Depo-Medrol. Discussed antihistamines.

## 2012-11-29 NOTE — Assessment & Plan Note (Signed)
Acute exacerbation, probably allergic. Plan-nebulizer Xopenex, Depo-Medrol. Refill her rescue inhaler.

## 2012-12-02 ENCOUNTER — Ambulatory Visit (INDEPENDENT_AMBULATORY_CARE_PROVIDER_SITE_OTHER): Payer: Medicare Other | Admitting: Internal Medicine

## 2012-12-02 ENCOUNTER — Telehealth: Payer: Self-pay | Admitting: Internal Medicine

## 2012-12-02 ENCOUNTER — Encounter: Payer: Self-pay | Admitting: Internal Medicine

## 2012-12-02 ENCOUNTER — Ambulatory Visit (INDEPENDENT_AMBULATORY_CARE_PROVIDER_SITE_OTHER)
Admission: RE | Admit: 2012-12-02 | Discharge: 2012-12-02 | Disposition: A | Discharge status: Home / Self Care | Payer: Medicare Other | Source: Ambulatory Visit | Attending: Internal Medicine | Admitting: Internal Medicine

## 2012-12-02 VITALS — BP 122/74 | HR 51 | Ht 66.0 in | Wt 156.6 lb

## 2012-12-02 DIAGNOSIS — R0600 Dyspnea, unspecified: Secondary | ICD-10-CM

## 2012-12-02 DIAGNOSIS — J45909 Unspecified asthma, uncomplicated: Secondary | ICD-10-CM

## 2012-12-02 DIAGNOSIS — I4891 Unspecified atrial fibrillation: Secondary | ICD-10-CM

## 2012-12-02 DIAGNOSIS — R0989 Other specified symptoms and signs involving the circulatory and respiratory systems: Secondary | ICD-10-CM

## 2012-12-02 MED ORDER — MOMETASONE FURO-FORMOTEROL FUM 100-5 MCG/ACT IN AERO: 2.0000 | INHALATION_SPRAY | Freq: Two times a day (BID) | RESPIRATORY_TRACT | Status: DC

## 2012-12-02 MED ORDER — LEVALBUTEROL HCL 0.63 MG/3ML IN NEBU
0.6300 mg | INHALATION_SOLUTION | Freq: Once | RESPIRATORY_TRACT | Status: AC
  Administered 2012-12-02: 0.63 mg via RESPIRATORY_TRACT

## 2012-12-02 MED ORDER — MOMETASONE FURO-FORMOTEROL FUM 100-5 MCG/ACT IN AERO: INHALATION_SPRAY | RESPIRATORY_TRACT | Status: DC

## 2012-12-02 MED ORDER — METHYLPREDNISOLONE ACETATE 80 MG/ML IJ SUSP
80.0000 mg | Freq: Once | INTRAMUSCULAR | Status: AC
  Administered 2012-12-02: 80 mg via INTRAMUSCULAR

## 2012-12-02 NOTE — Telephone Encounter (Signed)
Called, spoke with pt.  She was seen on 4/9 by CDY and was given neb and depo.   Zpak and pred taper called in on 11/27/12. Pt states she finished zpak and has 1 dose of pred taper left. Doesn't feel like symptoms have cleared up "like I want them to." Reports she still has a "wheezing like cough."  States she coughes up clear mucus with a very little bit of yellow mixed in at times and has a runny nose with clear drainage.  No chest tightness, chest pain, f/c/s.  Does have increased SOB with cough.  Scheduled today to see Dr. Maple Hudson at 1:30 -- pt aware.

## 2012-12-02 NOTE — Patient Instructions (Addendum)
Order- CXR   Dx asthma with bronchitis  Order lab- BNP, BMET   Dx dyspnea  Neb xop 0.63  Depo 80  Sample and script Dulera 100, 2 puffs then rinse mouth, twice daily- maintenance inhaler  You can still use your rescue albuterol inhaler 2 puffs, up to 4 times daily if needed for chest tightness and wheeze.

## 2012-12-02 NOTE — Progress Notes (Signed)
06/23/11- 86 yoF never smoker followed for asthma, rhinitis, complicated by A. fib, HBP LOV-10/14/2010 She declines flu shot but had pneumonia vaccine. For the past week has intermittent chest congestion and morning cough, some blood in mucus from right nostril. If she hurries she gets short of breath and in the winter she will wheeze. She does not think she has a cold. Denies chest pain, swelling, fever. Husband turns the heat up in the home much too high for her comfort. She is confused about the roles of Qvar and her rescue inhaler.  08/28/11- 86 yoF never smoker followed for asthma, rhinitis, complicated by A. fib, HBP   Had declined flu vax  acute illness-1 week , began with left maxillary and left ear pressure pain, sore throat. She had taken a Medrol taper which opened her and she began coughing out green phlegm. We gave Z-Pak January 8. She blamed Medrol for nausea and vomiting . Now nasal congestion, post nasal drip. Chest is no longer tight. Sputum is clear, no fever or pain.  05/01/12- 86 yoF never smoker followed for asthma, rhinitis, complicated by A. fib, HBP Dry cough and wheezing; has been doing good until having to do yard work; QVAR causing sore throat-doesnt use Aerochamber with it. Blames pollen while working outdoors over the last 2 weeks for increased nasal congestion with recent loud snore. In the morning has raspy cough and wheeze. Using her rescue inhaler once each morning. Does not think she has a cold. Has never had a flu shot. I advised we start but she deferred.  09/12/2012 Acute OV  Complains of wheezing, DOE x1 week .  Complains over the last 3 weeks that she has some nasal congestion, drainage, and cough. Initially called in a Z-Pak which helped with her cough/congestion /sinus symptoms, however, she continues to have a mild dry cough and intermittent wheezing.  Patient denies any discolored mucus, fever, or hemoptysis, orthopnea, PND, or unintentional weight loss. She  has had increased rescue inhaler use.  11/20/12- 87 yoF never smoker followed for asthma, rhinitis, complicated by A. fib, HBP FOLLOWS FOR: having chest congestion/wheezing-feels stuck in throat area-would like to know if she needs neb tx. Watery rhinorrhea, wheezing and shortness of breath the past 2 or 3 days. Doesn't think it's a cold. Taking Zyrtec. Dymista nasal spray did not help. Rescue inhaler works well. Husband died of heart disease/CHF in March.  12/02/12- 87 yoF never smoker followed for asthma, rhinitis, complicated by A. fib, HBP ACUTE VISIT: seen 11-20-2012; still having wheezing(rattles) in chest, cough-non productive; took Zpak and 1 day left of Prednisone. Feels run down. Since last office visit has taken prednisone and a Z-Pak. Malaise is partly from grieving over death of her husband. She still wheezes some and continues to need Zyrtec for spring pollen rhinitis.  ROS-see HPI Constitutional:   No-   weight loss, night sweats, fevers, chills, fatigue, lassitude. HEENT:   No-  headaches, difficulty swallowing, tooth/dental problems, sore throat,       No-  sneezing, itching, ear ache, +nasal congestion, +post nasal drip,  CV:  No-   chest pain, orthopnea, PND, swelling in lower extremities, anasarca,                                  dizziness, palpitations Resp: +  shortness of breath with exertion or at rest.  No-   productive cough,  No non-productive cough,  No- coughing up of blood.              No-   change in color of mucus.  + wheezing.   Skin: No-   rash or lesions. GI:  No-   heartburn, indigestion, abdominal pain, nausea, vomiting, GU:  MS:  No-   joint pain or swelling.. Neuro-     nothing unusual Psych:  No- change in mood or affect. No depression or anxiety.  No memory loss.  OBJ- Physical Exam General- Alert, Oriented, Affect-appropriate, Distress- none acute Skin- rash-none, lesions- none, excoriation- none Lymphadenopathy- none Head- atraumatic             Eyes- Gross vision intact, PERRLA, conjunctivae and secretions clear            Ears- Hearing, canals-normal            Nose- +mucus bridging, no-Septal dev,  polyps, erosion, perforation             Throat- Mallampati II , mucosa clear , drainage- none, tonsils- atrophic.+ Dentures Neck- flexible , trachea midline, no stridor , thyroid nl, carotid no bruit Chest - symmetrical excursion , unlabored           Heart/CV- +RRR-regular on exam today , no murmur , no gallop  , no rub, nl s1 s2                           - JVD- none , edema- none, stasis changes- none, varices- none           Lung- +Bilateral mild wheeze, unlabored, cough- none , dullness-none, rub- none           Chest wall-  Abd-  Br/ Gen/ Rectal- Not done, not indicated Extrem- cyanosis- none, clubbing, none, atrophy- none, strength- nl Neuro- grossly intact to observation

## 2012-12-06 ENCOUNTER — Telehealth: Payer: Self-pay | Admitting: Internal Medicine

## 2012-12-06 NOTE — Telephone Encounter (Signed)
Pt aware of results 

## 2012-12-06 NOTE — Progress Notes (Signed)
Quick Note:  LMTCB ______ 

## 2012-12-06 NOTE — Progress Notes (Signed)
Quick Note:  Pt aware of results. ______ 

## 2012-12-08 NOTE — Assessment & Plan Note (Signed)
Exam today was consistent with sinus rhythm.

## 2012-12-08 NOTE — Assessment & Plan Note (Addendum)
Mild exacerbation with audible wheezing. Plan-chest x-ray, nebulized Xopenex, Depo-Medrol, Dulera 100. Medication talk . BNP, BMET

## 2013-01-03 ENCOUNTER — Ambulatory Visit (INDEPENDENT_AMBULATORY_CARE_PROVIDER_SITE_OTHER): Payer: Medicare Other | Admitting: Pharmacist Clinician (PhC)/ Clinical Pharmacy Specialist

## 2013-01-03 VITALS — BP 130/70 | HR 68

## 2013-01-03 DIAGNOSIS — I4891 Unspecified atrial fibrillation: Secondary | ICD-10-CM

## 2013-01-03 DIAGNOSIS — Z7901 Long term (current) use of anticoagulants: Secondary | ICD-10-CM

## 2013-01-03 LAB — POCT INR: INR: 4.4

## 2013-01-08 ENCOUNTER — Emergency Department (HOSPITAL_COMMUNITY): Payer: Medicare Other

## 2013-01-08 ENCOUNTER — Inpatient Hospital Stay (HOSPITAL_COMMUNITY): Payer: Medicare Other

## 2013-01-08 ENCOUNTER — Encounter (HOSPITAL_COMMUNITY): Payer: Self-pay | Admitting: Emergency Medicine

## 2013-01-08 ENCOUNTER — Inpatient Hospital Stay (HOSPITAL_COMMUNITY)
Admission: EM | Admit: 2013-01-08 | Discharge: 2013-01-16 | DRG: 536 | Disposition: A | Discharge status: Skilled Nursing Facility | Payer: Medicare Other | Attending: Internal Medicine | Admitting: Internal Medicine

## 2013-01-08 DIAGNOSIS — J3089 Other allergic rhinitis: Secondary | ICD-10-CM

## 2013-01-08 DIAGNOSIS — E785 Hyperlipidemia, unspecified: Secondary | ICD-10-CM

## 2013-01-08 DIAGNOSIS — M810 Age-related osteoporosis without current pathological fracture: Secondary | ICD-10-CM

## 2013-01-08 DIAGNOSIS — D62 Acute posthemorrhagic anemia: Secondary | ICD-10-CM | POA: Diagnosis not present

## 2013-01-08 DIAGNOSIS — I4891 Unspecified atrial fibrillation: Secondary | ICD-10-CM

## 2013-01-08 DIAGNOSIS — S32509A Unspecified fracture of unspecified pubis, initial encounter for closed fracture: Principal | ICD-10-CM

## 2013-01-08 DIAGNOSIS — J45909 Unspecified asthma, uncomplicated: Secondary | ICD-10-CM

## 2013-01-08 DIAGNOSIS — R112 Nausea with vomiting, unspecified: Secondary | ICD-10-CM

## 2013-01-08 DIAGNOSIS — N390 Urinary tract infection, site not specified: Secondary | ICD-10-CM

## 2013-01-08 DIAGNOSIS — S32591A Other specified fracture of right pubis, initial encounter for closed fracture: Secondary | ICD-10-CM

## 2013-01-08 DIAGNOSIS — K5900 Constipation, unspecified: Secondary | ICD-10-CM | POA: Diagnosis present

## 2013-01-08 DIAGNOSIS — M1990 Unspecified osteoarthritis, unspecified site: Secondary | ICD-10-CM

## 2013-01-08 DIAGNOSIS — N179 Acute kidney failure, unspecified: Secondary | ICD-10-CM

## 2013-01-08 DIAGNOSIS — K5730 Diverticulosis of large intestine without perforation or abscess without bleeding: Secondary | ICD-10-CM

## 2013-01-08 DIAGNOSIS — Z860100 Personal history of colon polyps, unspecified: Secondary | ICD-10-CM

## 2013-01-08 DIAGNOSIS — J302 Other seasonal allergic rhinitis: Secondary | ICD-10-CM

## 2013-01-08 DIAGNOSIS — A498 Other bacterial infections of unspecified site: Secondary | ICD-10-CM | POA: Diagnosis present

## 2013-01-08 DIAGNOSIS — Y92009 Unspecified place in unspecified non-institutional (private) residence as the place of occurrence of the external cause: Secondary | ICD-10-CM

## 2013-01-08 DIAGNOSIS — Z7901 Long term (current) use of anticoagulants: Secondary | ICD-10-CM

## 2013-01-08 DIAGNOSIS — Z8719 Personal history of other diseases of the digestive system: Secondary | ICD-10-CM

## 2013-01-08 DIAGNOSIS — D689 Coagulation defect, unspecified: Secondary | ICD-10-CM

## 2013-01-08 DIAGNOSIS — S32599A Other specified fracture of unspecified pubis, initial encounter for closed fracture: Secondary | ICD-10-CM

## 2013-01-08 DIAGNOSIS — R339 Retention of urine, unspecified: Secondary | ICD-10-CM | POA: Diagnosis not present

## 2013-01-08 DIAGNOSIS — N281 Cyst of kidney, acquired: Secondary | ICD-10-CM

## 2013-01-08 DIAGNOSIS — R338 Other retention of urine: Secondary | ICD-10-CM | POA: Diagnosis not present

## 2013-01-08 DIAGNOSIS — Z87442 Personal history of urinary calculi: Secondary | ICD-10-CM

## 2013-01-08 DIAGNOSIS — I10 Essential (primary) hypertension: Secondary | ICD-10-CM

## 2013-01-08 DIAGNOSIS — W108XXA Fall (on) (from) other stairs and steps, initial encounter: Secondary | ICD-10-CM | POA: Diagnosis present

## 2013-01-08 DIAGNOSIS — W19XXXA Unspecified fall, initial encounter: Secondary | ICD-10-CM | POA: Diagnosis present

## 2013-01-08 DIAGNOSIS — F411 Generalized anxiety disorder: Secondary | ICD-10-CM

## 2013-01-08 LAB — POCT I-STAT, CHEM 8
Chloride: 106 mEq/L (ref 96–112)
HCT: 39 % (ref 36.0–46.0)
Hemoglobin: 13.3 g/dL (ref 12.0–15.0)
Potassium: 3.5 mEq/L (ref 3.5–5.1)
Sodium: 141 mEq/L (ref 135–145)

## 2013-01-08 LAB — PROTIME-INR
INR: 1.94 — ABNORMAL HIGH (ref 0.00–1.49)
Prothrombin Time: 21.4 seconds — ABNORMAL HIGH (ref 11.6–15.2)

## 2013-01-08 LAB — CBC
HCT: 39.2 % (ref 36.0–46.0)
Hemoglobin: 12.8 g/dL (ref 12.0–15.0)
MCHC: 32.7 g/dL (ref 30.0–36.0)
RBC: 4.3 MIL/uL (ref 3.87–5.11)
WBC: 6.5 10*3/uL (ref 4.0–10.5)

## 2013-01-08 LAB — URINALYSIS, ROUTINE W REFLEX MICROSCOPIC
Glucose, UA: NEGATIVE mg/dL
Hgb urine dipstick: NEGATIVE
Leukocytes, UA: NEGATIVE
Protein, ur: NEGATIVE mg/dL
Specific Gravity, Urine: 1.019 (ref 1.005–1.030)

## 2013-01-08 LAB — URINE MICROSCOPIC-ADD ON

## 2013-01-08 MED ORDER — IRBESARTAN 300 MG PO TABS
300.0000 mg | ORAL_TABLET | Freq: Every day | ORAL | Status: DC
  Filled 2013-01-08: qty 1

## 2013-01-08 MED ORDER — PHENOL 1.4 % MT LIQD
1.0000 | OROMUCOSAL | Status: DC | PRN
  Filled 2013-01-08: qty 177

## 2013-01-08 MED ORDER — DOCUSATE SODIUM 100 MG PO CAPS
100.0000 mg | ORAL_CAPSULE | Freq: Two times a day (BID) | ORAL | Status: DC
  Administered 2013-01-09 – 2013-01-16 (×12): 100 mg via ORAL
  Filled 2013-01-08 (×17): qty 1

## 2013-01-08 MED ORDER — ACETAMINOPHEN 650 MG RE SUPP: 650.0000 mg | Freq: Four times a day (QID) | RECTAL | Status: DC | PRN

## 2013-01-08 MED ORDER — VERAPAMIL HCL ER 240 MG PO TBCR
240.0000 mg | EXTENDED_RELEASE_TABLET | Freq: Every day | ORAL | Status: DC
  Administered 2013-01-09 – 2013-01-16 (×8): 240 mg via ORAL
  Filled 2013-01-08 (×9): qty 1

## 2013-01-08 MED ORDER — METOCLOPRAMIDE HCL 5 MG/ML IJ SOLN
5.0000 mg | Freq: Three times a day (TID) | INTRAMUSCULAR | Status: DC | PRN
  Administered 2013-01-11 – 2013-01-12 (×3): 10 mg via INTRAVENOUS
  Filled 2013-01-08 (×4): qty 2

## 2013-01-08 MED ORDER — ONDANSETRON HCL 4 MG/2ML IJ SOLN
4.0000 mg | Freq: Four times a day (QID) | INTRAMUSCULAR | Status: DC | PRN
  Filled 2013-01-08: qty 2

## 2013-01-08 MED ORDER — PROCHLORPERAZINE EDISYLATE 5 MG/ML IJ SOLN
10.0000 mg | Freq: Four times a day (QID) | INTRAMUSCULAR | Status: DC | PRN
  Administered 2013-01-08 – 2013-01-13 (×7): 10 mg via INTRAVENOUS
  Filled 2013-01-08 (×9): qty 2

## 2013-01-08 MED ORDER — LORAZEPAM 2 MG/ML IJ SOLN
0.5000 mg | Freq: Once | INTRAMUSCULAR | Status: AC
  Administered 2013-01-08: 0.5 mg via INTRAVENOUS
  Filled 2013-01-08: qty 1

## 2013-01-08 MED ORDER — WARFARIN - PHARMACIST DOSING INPATIENT: Freq: Every day | Status: DC

## 2013-01-08 MED ORDER — MORPHINE SULFATE 2 MG/ML IJ SOLN: 0.5000 mg | INTRAMUSCULAR | Status: DC | PRN

## 2013-01-08 MED ORDER — RALOXIFENE HCL 60 MG PO TABS
60.0000 mg | ORAL_TABLET | Freq: Every day | ORAL | Status: DC
  Administered 2013-01-09 – 2013-01-16 (×8): 60 mg via ORAL
  Filled 2013-01-08 (×9): qty 1

## 2013-01-08 MED ORDER — ALPRAZOLAM 0.25 MG PO TABS
0.2500 mg | ORAL_TABLET | Freq: Every evening | ORAL | Status: DC | PRN
  Administered 2013-01-09 – 2013-01-11 (×4): 0.25 mg via ORAL
  Filled 2013-01-08 (×4): qty 1

## 2013-01-08 MED ORDER — FENTANYL CITRATE 0.05 MG/ML IJ SOLN
50.0000 ug | Freq: Once | INTRAMUSCULAR | Status: AC
  Administered 2013-01-08: 50 ug via INTRAVENOUS
  Filled 2013-01-08: qty 2

## 2013-01-08 MED ORDER — ONDANSETRON HCL 4 MG/2ML IJ SOLN
4.0000 mg | Freq: Once | INTRAMUSCULAR | Status: AC
  Administered 2013-01-08: 4 mg via INTRAVENOUS
  Filled 2013-01-08: qty 2

## 2013-01-08 MED ORDER — WARFARIN SODIUM 2.5 MG PO TABS
2.5000 mg | ORAL_TABLET | Freq: Once | ORAL | Status: AC
  Administered 2013-01-08: 2.5 mg via ORAL
  Filled 2013-01-08: qty 1

## 2013-01-08 MED ORDER — METOPROLOL TARTRATE 50 MG PO TABS
75.0000 mg | ORAL_TABLET | Freq: Two times a day (BID) | ORAL | Status: DC
  Administered 2013-01-09 – 2013-01-15 (×13): 75 mg via ORAL
  Filled 2013-01-08 (×17): qty 1

## 2013-01-08 MED ORDER — POLYETHYLENE GLYCOL 3350 17 G PO PACK
17.0000 g | PACK | Freq: Every day | ORAL | Status: DC | PRN
  Filled 2013-01-08: qty 1

## 2013-01-08 MED ORDER — ONDANSETRON HCL 4 MG PO TABS
4.0000 mg | ORAL_TABLET | Freq: Four times a day (QID) | ORAL | Status: DC | PRN
  Administered 2013-01-10 – 2013-01-11 (×2): 4 mg via ORAL
  Filled 2013-01-08 (×2): qty 1

## 2013-01-08 MED ORDER — TRAMADOL HCL 50 MG PO TABS
100.0000 mg | ORAL_TABLET | Freq: Four times a day (QID) | ORAL | Status: DC | PRN
  Administered 2013-01-09: 100 mg via ORAL
  Filled 2013-01-08: qty 2

## 2013-01-08 MED ORDER — METOCLOPRAMIDE HCL 10 MG PO TABS: 5.0000 mg | ORAL_TABLET | Freq: Three times a day (TID) | ORAL | Status: DC | PRN

## 2013-01-08 MED ORDER — FLEET ENEMA 7-19 GM/118ML RE ENEM: 1.0000 | ENEMA | Freq: Once | RECTAL | Status: AC | PRN

## 2013-01-08 MED ORDER — MENTHOL 3 MG MT LOZG
1.0000 | LOZENGE | OROMUCOSAL | Status: DC | PRN
  Filled 2013-01-08: qty 9

## 2013-01-08 MED ORDER — ACETAMINOPHEN 325 MG PO TABS
650.0000 mg | ORAL_TABLET | Freq: Four times a day (QID) | ORAL | Status: DC | PRN
  Administered 2013-01-09 – 2013-01-10 (×3): 650 mg via ORAL
  Filled 2013-01-08 (×4): qty 2

## 2013-01-08 MED ORDER — BISACODYL 5 MG PO TBEC: 5.0000 mg | DELAYED_RELEASE_TABLET | Freq: Every day | ORAL | Status: DC | PRN

## 2013-01-08 MED ORDER — HYDROCODONE-ACETAMINOPHEN 5-325 MG PO TABS: 1.0000 | ORAL_TABLET | Freq: Four times a day (QID) | ORAL | Status: DC | PRN

## 2013-01-08 MED ORDER — ALBUTEROL SULFATE HFA 108 (90 BASE) MCG/ACT IN AERS
2.0000 | INHALATION_SPRAY | RESPIRATORY_TRACT | Status: DC | PRN
  Administered 2013-01-14: 2 via RESPIRATORY_TRACT
  Filled 2013-01-08: qty 6.7

## 2013-01-08 NOTE — Consult Note (Addendum)
Reason for Consult:Right superior and inferior pubic rami fractures. Referring Physician: Ashok Pall  MD Consulting Physician:Clydette Privitera E  Orthopedic Diagnosis:1)Right closed superior and inferior pubic rami fractures, extra-articular. Superior ramus with comminution and displaced mildly. Inferior pubic ramus is nondisplaced. 2)Senile osteoporosis, on Evista, does not take calcium due to history of nephrolithiasis no Vitamin D supplements.  3)Multiple dorsal compression fractures, D5,6 andD7 mild increased thoracic kyphosis, likely related to osteoporosis, age indeterminant.  ZOX:WRUEAV Joanna Salas is an 79 y.o. female. She has a history of osteoporosis and has been evaluated previously by Dr. Norlene Campbell for right knee complaints. Her last outpatient visit in June 2013. At this morning while attempting to step up on a short step ladder she apparently lost her balance and fell backwards landing on her right buttock and hip area. She had immediate pain and discomfort and was unable to ambulate or place weight on the right lower extremity. She was brought to the emergency room via ambulance and evaluated in the emergency room and found to have right sided superior and inferior pubic rami fractures. She has shown difficulties in trying to stand and bear weight on the right leg. She is able to void and small amounts and is not sure that she is fully emptying her bladder. Underwent chest x-ray and CT scan of the head which demonstrated no acute finding. CT of the right hip was done to ensure no sign of femoral neck were acetabular injury and confirmed only involvement of the right inferior and superior pubic rami. Orthopedic consultation was requested the patient has been admitted to medicine service.    Past Medical History  Diagnosis Date  . Personal history of colonic polyps   . Atrial fibrillation   . Allergic rhinitis, cause unspecified   . Unspecified asthma(493.90)   . Personal history of  other diseases of digestive system   . Personal history of urinary calculi   . Osteoporosis, unspecified   . DJD (degenerative joint disease)   . HTN (hypertension)   . Hyperlipidemia   . Anxiety state, unspecified     Past Surgical History  Procedure Laterality Date  . Appendectomy    . Tonsillectomy    . Hemicolectomy      right    Family History  Problem Relation Age of Onset  . Heart disease Mother   . Heart disease Father     Social History:  reports that she has never smoked. She has never used smokeless tobacco. She reports that she does not drink alcohol or use illicit drugs.  Allergies:  Allergies  Allergen Reactions  . Pneumococcal Vaccines   . Atorvastatin Other (See Comments)    Severe Leg Cramps  . Morphine Sulfate Nausea Only  . Penicillins     REACTION: unspecified  . Prednisone   . Sulfonamide Derivatives Nausea Only    Medications:  Prior to Admission:  Prescriptions prior to admission  Medication Sig Dispense Refill  . acetaminophen (TYLENOL) 500 MG tablet Take 1,000 mg by mouth every 6 (six) hours as needed for pain.      Marland Kitchen albuterol (PROAIR HFA) 108 (90 BASE) MCG/ACT inhaler Inhale 2 puffs into the lungs every 4 (four) hours as needed for wheezing or shortness of breath.  8.5 g  prn  . ALPRAZolam (XANAX) 0.25 MG tablet Take 0.25 mg by mouth at bedtime as needed for sleep.      . cetirizine (ZYRTEC) 10 MG tablet Take 10 mg by mouth at bedtime.      Marland Kitchen  meclizine (ANTIVERT) 25 MG tablet Take 25-50 mg by mouth 3 (three) times daily as needed for dizziness.      . metoprolol (LOPRESSOR) 50 MG tablet Take 75 mg by mouth 2 (two) times daily.       . ondansetron (ZOFRAN) 4 MG tablet Take 4 mg by mouth every 6 (six) hours as needed for nausea.      . promethazine (PHENERGAN) 12.5 MG tablet Take 12.5-25 mg by mouth at bedtime as needed for nausea (dizziness).      . raloxifene (EVISTA) 60 MG tablet Take 60 mg by mouth daily.        . valsartan (DIOVAN) 320  MG tablet Take 320 mg by mouth daily.        . verapamil (VERELAN PM) 240 MG 24 hr capsule Take 240 mg by mouth daily.        Marland Kitchen warfarin (COUMADIN) 2.5 MG tablet Take 1.25-2.5 mg by mouth daily. 0.5 tab on Sun, Tues, and Thurs; 1 tab all other days.        Results for orders placed during the hospital encounter of 01/08/13 (from the past 48 hour(s))  PROTIME-INR     Status: Abnormal   Collection Time    01/08/13 10:34 AM      Result Value Range   Prothrombin Time 21.4 (*) 11.6 - 15.2 seconds   INR 1.94 (*) 0.00 - 1.49  CBC     Status: Abnormal   Collection Time    01/08/13 10:34 AM      Result Value Range   WBC 6.5  4.0 - 10.5 K/uL   RBC 4.30  3.87 - 5.11 MIL/uL   Hemoglobin 12.8  12.0 - 15.0 g/dL   HCT 16.1  09.6 - 04.5 %   MCV 91.2  78.0 - 100.0 fL   MCH 29.8  26.0 - 34.0 pg   MCHC 32.7  30.0 - 36.0 g/dL   RDW 40.9 (*) 81.1 - 91.4 %   Platelets 109 (*) 150 - 400 K/uL   Comment: SPECIMEN CHECKED FOR CLOTS     REPEATED TO VERIFY     LARGE PLATELETS PRESENT  POCT I-STAT, CHEM 8     Status: Abnormal   Collection Time    01/08/13 10:42 AM      Result Value Range   Sodium 141  135 - 145 mEq/L   Potassium 3.5  3.5 - 5.1 mEq/L   Chloride 106  96 - 112 mEq/L   BUN 11  6 - 23 mg/dL   Creatinine, Ser 7.82  0.50 - 1.10 mg/dL   Glucose, Bld 956 (*) 70 - 99 mg/dL   Calcium, Ion 2.13  0.86 - 1.30 mmol/L   TCO2 26  0 - 100 mmol/L   Hemoglobin 13.3  12.0 - 15.0 g/dL   HCT 57.8  46.9 - 62.9 %  URINALYSIS, ROUTINE W REFLEX MICROSCOPIC     Status: Abnormal   Collection Time    01/08/13  1:28 PM      Result Value Range   Color, Urine YELLOW  YELLOW   APPearance CLEAR  CLEAR   Specific Gravity, Urine 1.019  1.005 - 1.030   pH 5.0  5.0 - 8.0   Glucose, UA NEGATIVE  NEGATIVE mg/dL   Hgb urine dipstick NEGATIVE  NEGATIVE   Bilirubin Urine NEGATIVE  NEGATIVE   Ketones, ur NEGATIVE  NEGATIVE mg/dL   Protein, ur NEGATIVE  NEGATIVE mg/dL   Urobilinogen, UA 0.2  0.0 -  1.0 mg/dL   Nitrite  POSITIVE (*) NEGATIVE   Leukocytes, UA NEGATIVE  NEGATIVE  URINE MICROSCOPIC-ADD ON     Status: Abnormal   Collection Time    01/08/13  1:28 PM      Result Value Range   Squamous Epithelial / LPF FEW (*) RARE   WBC, UA 0-2  <3 WBC/hpf   Bacteria, UA MANY (*) RARE   Urine-Other MUCOUS PRESENT      Dg Chest 2 View  01/08/2013   *RADIOLOGY REPORT*  Clinical Data: Fall with posterior pelvic and right-sided hip pain. Shortness of breath.  CHEST - 2 VIEW  Comparison: 12/02/2012  Findings: Hyperinflation. Lateral view degraded by patient arm position.  Osteopenia.  Accentuation of expected thoracic kyphosis. Vertebral body height loss at an upper thoracic level is moderate and not significantly changed.  Midline trachea.  Moderate cardiomegaly with atherosclerosis in the transverse aorta.  Mild right hemidiaphragm elevation. No pleural effusion or pneumothorax.  No congestive failure.  IMPRESSION: Cardiomegaly and hyperinflation, without acute process.  Osteopenia with a chronic moderate upper thoracic compression deformity.   Original Report Authenticated By: Jeronimo Greaves, M.D.   Dg Hip Complete Right  01/08/2013   *RADIOLOGY REPORT*  Clinical Data: Fall.  Posterior pelvic pain and right side.  RIGHT HIP - COMPLETE 2+ VIEW  Comparison: Abdominal pelvic CT of 12/13/2011  Findings: Surgical sutures project over the lower right abdomen/upper pelvis. Sacroiliac joints are symmetric.  Femoral heads are located. Fractures of the right superior inferior pubic rami.  No femoral neck fracture.  IMPRESSION: Acute right superior and inferior pubic rami fractures.  CT may be informative to evaluate for possible extension into the right hip joint.   Original Report Authenticated By: Jeronimo Greaves, M.D.   Ct Head Wo Contrast  01/08/2013   *RADIOLOGY REPORT*  Clinical Data: Recent traumatic injury with headache  CT HEAD WITHOUT CONTRAST  Technique:  Contiguous axial images were obtained from the base of the skull through  the vertex without contrast.  Comparison: 05/19/08  Findings: The bony calvarium is intact.  Mild mucosal thickening is noted within the maxillary antra bilaterally.  The ventricles are normal size configuration.  Mild chronic white matter ischemic change is seen similar to that noted on prior exam.  No acute hemorrhage, acute infarction or space-occupying mass lesion is noted.  IMPRESSION: Chronic changes without acute abnormality.   Original Report Authenticated By: Alcide Clever, M.D.   Ct Hip Right Wo Contrast  01/08/2013   *RADIOLOGY REPORT*  Clinical Data: Right hip pain status post fall.  Question fracture.  CT OF THE RIGHT HIP WITHOUT CONTRAST  Technique:  Multidetector CT imaging was performed according to the standard protocol. Multiplanar CT image reconstructions were also generated.  Comparison: Right hip and pelvic radiographs 01/08/2013.  Pelvic CT 12/13/2011.  Findings: Examination is limited to the right hip and inferior right hemi pelvis.  The entire pelvis is not imaged.  As demonstrated on the earlier radiographs, there are mildly displaced fractures involving the right superior and inferior pubic rami.  Neither fracture demonstrates intra-articular extension to the hip joint.  There is no evidence of femoral head/neck fracture or avascular necrosis.  Mild subchondral cyst formation is present anteriorly in the acetabulum.  There is no evidence of large pelvic hematoma or hip joint effusion.  IMPRESSION:  1.  Mildly displaced fractures of the right superior and inferior pubic rami.  No intra-articular extension identified. 2.  No evidence of proximal femur fracture.  Original Report Authenticated By: Carey Bullocks, M.D.    @ROS @ Blood pressure 111/63, pulse 81, temperature 98.5 Joanna (36.9 C), temperature source Oral, resp. rate 24, height 5' 6.5" (1.689 m), weight 72.576 kg (160 lb), SpO2 94.00%. Body mass index is 25.44 kg/(m^2).  Orthopaedic Exam: She is awake, alert, and oriented  x4.She is able to describe her accident and concerns about the right leg and hip. Clinically some minimal swelling about the right anterior hip. She is able to internally and externally rotate the right leg and hip was minimal discomfort. She has pain with stressing of the right hip in internal and external rotation the and flexion. Pain is primarily over the right anterior groin. Right lower extremity motor including foot dorsiflexion plantarflexion knee extension and flexion and hip flexion are intact. Sensory the right lower chin that is normal popliteal artery pulses normal dorsalis pedis pulses trace as is posterior tibialis pulse. Right foot is warm sensate with good capillary refill and is nonpainful. Right knee demonstrates no acute findings. Tibia femur show no gross deformity. Plain radiographs reviewed and described above.   Assessment/Plan:  1) multiple pubic rami fracture right superior and inferior pubic rami fracture. Superior pubic ramus is comminuted at the fracture site and mildly displaced. The inferior pubic ramus fracture is nondisplaced. 2 ) osteoporosis currently on leave this to without calcium or vitamin supplement. 3 ) chest x-ray with multiple dorsal compression deformities T5, T6 and T7 these are likely secondary to osteoporosis she does not describe any previous injury there is some mild increase in dorsal kyphosis present.   Plan:1) Recommend the patient began graduated progressive ambulation program with physical therapy and occupational therapy. 2)A bladder scan should be done post void to assess for residual urine as she may develop urinary retention due to to pubic ramus fracture and the use pain medicines. 3) Recommend social service consult for consideration of short-term skilled nursing facility and this patient will require a period of 2-4 weeks before she is able to ambulate independently and will require intensive physical therapy. [facility] and her daughter has had a recent stay there. 3) Check vitamin D level and sedimentation rate and screen for deficiency of vitamin D or multiple myeloma. Consider starting Miacalcin nasal spray or subcutaneous injection assistant healing and diminishing pain associated with an osteoporotic fracture. Consider vitamin D supplements.  4)Patient already is on Coumadin so that she should continue with this for anti-DVT and anti-coagulation for chronic atrial fibrillation.  Expected the healing of this fracture of her period of 6 weeks to 3 months first 2 weeks being the most difficult in mobilization but recommend that aggressive mobilization be undertaken to prevent risks of pulmonary decompensation or DVT. Will follow with you.  Nachum Derossett E 01/08/2013, 7:25 PM

## 2013-01-08 NOTE — H&P (Signed)
[facility] History and Physical  Joanna Salas ZOX:[id number] DOB: 1925/06/11 DOA: 01/08/2013  Referring physician: ER physician PCP: Willow Ora, MD   Chief Complaint: fall  HPI:  79 year old female with past medical history of atrial fibrillation on Coumadin, hypertension who presented to [facility] ED STATUS post fall at home. Patient reported no prodromal symptoms prior to fall such as chest pain, shortness of breath or palpitations. No lightheadedness, no dizziness or loss of consciousness. Patient did not have complaints of abdominal pain, nausea or vomiting but did have an episode of vomiting in ED. No diarrhea or constipation. No blood in the stool or urine. In ED, vital signs remained stable with blood pressure 119/57, heart rate 66, Tmax 98.6 F and O2 saturation of 94%. Her CBC was significant for platelet count of 109. BMP was unremarkable. INR on this admission is 1.94. Further evaluation included CT head which was negative for acute intracranial findings. X-ray of the right hip shows superior and inferior pubic rami fracture and a CT of the right hip confirms displaced right pubic rami fracture.   Assessment and plan:  Principal problem: *Fall, right pubic rami fracture - Likely mechanical - Appreciate or following and their recommendations - For now continue supportive care with low rate IV fluids, analgesics and antiemetics - Hold Coumadin for now if there is a plan for near future ortho surgery  Active Problems:  Hypertension - Resume home medications Nausea, vomiting - Perhaps secondary to analgesia. Continue antiemetics as needed Atrial fibrillation - INR 1.94 on this admission. Will hold Coumadin until Ortho evaluates for possible surgery  Manson Passey [facility] [phone]  Review of Systems:  Constitutional: Negative for fever, chills and malaise/fatigue. Negative for diaphoresis.  HENT: Negative for hearing loss, ear pain, nosebleeds, congestion, sore throat, neck  pain, tinnitus and ear discharge.   Eyes: Negative for blurred vision, double vision, photophobia, pain, discharge and redness.  Respiratory: Negative for cough, hemoptysis, sputum production, shortness of breath, wheezing and stridor.   Cardiovascular: Negative for chest pain, palpitations, orthopnea, claudication and leg swelling.  Gastrointestinal: Negative for nausea, vomiting and abdominal pain. Negative for heartburn, constipation, blood in stool and melena.  Genitourinary: Negative for dysuria, urgency, frequency, hematuria and flank pain.  Musculoskeletal: Positive for falls and hip fracture.  Skin: Negative for itching and rash.  Neurological: Negative for dizziness and weakness. Negative for tingling, tremors, sensory change, speech change, focal weakness, loss of consciousness and headaches.  Endo/Heme/Allergies: Negative for environmental allergies and polydipsia. Does not bruise/bleed easily.  Psychiatric/Behavioral: Negative for suicidal ideas. The patient is not nervous/anxious.      Past Medical History  Diagnosis Date  . Personal history of colonic polyps   . Atrial fibrillation   . Allergic rhinitis, cause unspecified   . Unspecified asthma(493.90)   . Personal history of other diseases of digestive system   . Personal history of urinary calculi   . Osteoporosis, unspecified   . DJD (degenerative joint disease)   . HTN (hypertension)   . Hyperlipidemia   . Anxiety state, unspecified    Past Surgical History  Procedure Laterality Date  . Appendectomy    . Tonsillectomy    . Hemicolectomy      right   Social History:  reports that she has never smoked. She has never used smokeless tobacco. She reports that she does not drink alcohol or use illicit drugs.  Allergies  Allergen Reactions  . Pneumococcal Vaccines   . Atorvastatin Other (See Comments)  Severe Leg Cramps  . Morphine Sulfate Nausea Only  . Penicillins     REACTION: unspecified  . Prednisone   .  Sulfonamide Derivatives Nausea Only    Family History:  Family History  Problem Relation Age of Onset  . Heart disease Mother   . Heart disease Father      Prior to Admission medications   Medication Sig Start Date End Date Taking? Authorizing Provider  acetaminophen (TYLENOL) 325 MG tablet Take 650 mg by mouth every 6 (six) hours as needed. Patient used this medication for her pain.   Yes Historical Provider, MD  albuterol (PROAIR HFA) 108 (90 BASE) MCG/ACT inhaler Inhale 2 puffs into the lungs every 4 (four) hours as needed for wheezing or shortness of breath. 11/20/12  Yes Waymon Budge, MD  ALPRAZolam Prudy Feeler) 0.25 MG tablet Take 1 tablet (0.25 mg total) by mouth at bedtime as needed. 07/01/12  Yes Wanda Plump, MD  metoprolol (LOPRESSOR) 50 MG tablet Take 75 mg by mouth 2 (two) times daily.    Yes Historical Provider, MD  ondansetron (ZOFRAN) 4 MG tablet Take 4 mg by mouth every 6 (six) hours as needed for nausea.   Yes Historical Provider, MD  promethazine (PHENERGAN) 12.5 MG tablet Take 12.5-25 mg by mouth at bedtime as needed for nausea (dizziness).   Yes Historical Provider, MD  raloxifene (EVISTA) 60 MG tablet Take 60 mg by mouth daily.     Yes Historical Provider, MD  valsartan (DIOVAN) 320 MG tablet Take 320 mg by mouth daily.     Yes Historical Provider, MD  verapamil (VERELAN PM) 240 MG 24 hr capsule Take 240 mg by mouth daily.     Yes Historical Provider, MD  warfarin (COUMADIN) 2.5 MG tablet Take 1.25-2.5 mg by mouth daily. Patient alternates every other day with 1.25mg  to 2.5mg .   Yes Historical Provider, MD   Physical Exam: Filed Vitals:   01/08/13 1000 01/08/13 1012 01/08/13 1300  BP: 159/88  119/57  Pulse: 86  66  Temp: 98.6 F (37 C)    TempSrc: Oral    Resp: 18  17  SpO2:  94% 95%    Physical Exam  Constitutional: Appears well-developed and well-nourished. No distress.  HENT: Normocephalic. External right and left ear normal. Oropharynx is clear and moist.   Eyes: Conjunctivae and EOM are normal. PERRLA, no scleral icterus.  Neck: Normal ROM. Neck supple. No JVD. No tracheal deviation. No thyromegaly.  CVS: irregular rhythm, rate controleld, S1/S2 appreciated Pulmonary: Effort and breath sounds normal, no stridor, rhonchi, wheezes, rales.  Abdominal: Soft. BS +,  no distension, tenderness, rebound or guarding.  Musculoskeletal: No edema, no cyanosis Lymphadenopathy: No lymphadenopathy noted, cervical, inguinal. Neuro: Alert. Normal reflexes, muscle tone coordination. No cranial nerve deficit. Skin: Skin is warm and dry. No rash noted. Not diaphoretic. No erythema. No pallor.    Labs on Admission:  Basic Metabolic Panel:  Recent Labs Lab 01/08/13 1042  NA 141  K 3.5  CL 106  GLUCOSE 145*  BUN 11  CREATININE 0.80   Liver Function Tests: No results found for this basename: AST, ALT, ALKPHOS, BILITOT, PROT, ALBUMIN,  in the last 168 hours No results found for this basename: LIPASE, AMYLASE,  in the last 168 hours No results found for this basename: AMMONIA,  in the last 168 hours CBC:  Recent Labs Lab 01/08/13 1034 01/08/13 1042  WBC 6.5  --   HGB 12.8 13.3  HCT 39.2 39.0  MCV 91.2  --   PLT 109*  --    Cardiac Enzymes: No results found for this basename: CKTOTAL, CKMB, CKMBINDEX, TROPONINI,  in the last 168 hours BNP: No components found with this basename: POCBNP,  CBG: No results found for this basename: GLUCAP,  in the last 168 hours  Radiological Exams on Admission: Dg Chest 2 View 01/08/2013    IMPRESSION: Cardiomegaly and hyperinflation, without acute process.  Osteopenia with a chronic moderate upper thoracic compression deformity.   Original Report Authenticated By: Jeronimo Greaves, M.D.   Dg Hip Complete Right 01/08/2013   * IMPRESSION: Acute right superior and inferior pubic rami fractures.  CT may be informative to evaluate for possible extension into the right hip joint.   Original Report Authenticated By: Jeronimo Greaves, M.D.   Ct Head WoContrast 01/08/2013    IMPRESSION: Chronic changes without acute abnormality.   Original Report Authenticated By: Alcide Clever, M.D.   Ct Hip Right Wo Contrast 01/08/2013   *  IMPRESSION:  1.  Mildly displaced fractures of the right superior and inferior pubic rami.  No intra-articular extension identified. 2.  No evidence of proximal femur fracture.   Original Report Authenticated By: Carey Bullocks, M.D.     Code Status: Full Family Communication: Pt at bedside Disposition Plan: Admit for further evaluation  Manson Passey, MD  [facility] Pager [phone]  If 7PM-7AM, please contact night-coverage [email or web address] Password [email or web address] 01/08/2013, 3:10 PM

## 2013-01-08 NOTE — ED Notes (Signed)
Patient transported to CT 

## 2013-01-08 NOTE — Progress Notes (Signed)
Orders verified with Dr. Ashok Pall & states no need & go ahead d/c order for pelvis xray & activity as tolerated.

## 2013-01-08 NOTE — ED Notes (Signed)
Pt nauseated and dry heaving. Made EDP Campos aware.  New orders given.

## 2013-01-08 NOTE — Progress Notes (Signed)
UR completed 

## 2013-01-08 NOTE — ED Provider Notes (Signed)
History     CSN: [id number]  Arrival date & time 01/08/13  7829   First MD Initiated Contact with Patient 01/08/13 [phone]      Chief Complaint  Patient presents with  . Fall  . Hip Pain    right     The history is provided by the patient and a relative.   Patient reports walking up a few steps today falling and landing on the right side of her body and presents right-sided hip and right-sided groin pain.  She is on Coumadin for history of atrial fibrillation.  She also reports that she struck the back of her head.  She denies neck pain.  She has no weakness of her upper lower extremities.  She is a mild headache at this time.  She reports nausea on route to the emergency department without vomiting..  She reports pain with ambulation and movement of her right hip.  She denies fevers and chills.  No recent illness.  She reports this was a balance issue which she has struggled with symptoms before in the past.  She lives at home with her daughter.   Past Medical History  Diagnosis Date  . Personal history of colonic polyps   . Atrial fibrillation   . Allergic rhinitis, cause unspecified   . Unspecified asthma(493.90)   . Personal history of other diseases of digestive system   . Personal history of urinary calculi   . Osteoporosis, unspecified   . DJD (degenerative joint disease)   . HTN (hypertension)   . Hyperlipidemia   . Anxiety state, unspecified     Past Surgical History  Procedure Laterality Date  . Appendectomy    . Tonsillectomy    . Hemicolectomy      right    Family History  Problem Relation Age of Onset  . Heart disease Mother   . Heart disease Father     History  Substance Use Topics  . Smoking status: Never Smoker   . Smokeless tobacco: Never Used  . Alcohol Use: No    OB History   Grav Para Term Preterm Abortions TAB SAB Ect Mult Living                  Review of Systems  All other systems reviewed and are negative.    Allergies   Pneumococcal vaccines; Atorvastatin; Morphine sulfate; Penicillins; Prednisone; and Sulfonamide derivatives  Home Medications   Current Outpatient Rx  Name  Route  Sig  Dispense  Refill  . acetaminophen (TYLENOL) 325 MG tablet   Oral   Take 650 mg by mouth every 6 (six) hours as needed. Patient used this medication for her pain.         Marland Kitchen albuterol (PROAIR HFA) 108 (90 BASE) MCG/ACT inhaler   Inhalation   Inhale 2 puffs into the lungs every 4 (four) hours as needed for wheezing or shortness of breath.   8.5 g   prn   . ALPRAZolam (XANAX) 0.25 MG tablet   Oral   Take 1 tablet (0.25 mg total) by mouth at bedtime as needed.   90 tablet   0   . metoprolol (LOPRESSOR) 50 MG tablet   Oral   Take 75 mg by mouth 2 (two) times daily.          . ondansetron (ZOFRAN) 4 MG tablet   Oral   Take 4 mg by mouth every 6 (six) hours as needed for nausea.         Marland Kitchen  promethazine (PHENERGAN) 12.5 MG tablet   Oral   Take 12.5-25 mg by mouth at bedtime as needed for nausea (dizziness).         . raloxifene (EVISTA) 60 MG tablet   Oral   Take 60 mg by mouth daily.           . valsartan (DIOVAN) 320 MG tablet   Oral   Take 320 mg by mouth daily.           . verapamil (VERELAN PM) 240 MG 24 hr capsule   Oral   Take 240 mg by mouth daily.           Marland Kitchen warfarin (COUMADIN) 2.5 MG tablet   Oral   Take 1.25-2.5 mg by mouth daily. Patient alternates every other day with 1.25mg  to 2.5mg .           BP 119/57  Pulse 66  Temp(Src) 98.6 F (37 C) (Oral)  Resp 17  SpO2 95%  Physical Exam  Nursing note and vitals reviewed. Constitutional: She is oriented to person, place, and time. She appears well-developed and well-nourished. No distress.  HENT:  Head: Normocephalic.  No scalp hematoma, laceration, abrasion  Eyes: EOM are normal.  Neck: Normal range of motion.  C-spine nontender.  C-spine cleared by Nexus criteria.  Cardiovascular: Normal rate, regular rhythm and  normal heart sounds.   Pulmonary/Chest: Effort normal and breath sounds normal. She exhibits no tenderness.  No bruising of her chest  Abdominal: Soft. She exhibits no distension. There is no tenderness. There is no rebound and no guarding.  No bruising of her abdominal wall  Musculoskeletal: Normal range of motion.  Full range of motion of bilateral hips.  She does localize her pain in her right hip more towards her right groin.  She has tenderness over the superior and inferior pubic rami on the right.  Normal pulses in her bilateral feet.  Neurological: She is alert and oriented to person, place, and time.  Skin: Skin is warm and dry.  Psychiatric: She has a normal mood and affect. Judgment normal.    ED Course  Procedures (including critical care time)  Labs Reviewed  PROTIME-INR - Abnormal; Notable for the following:    Prothrombin Time 21.4 (*)    INR 1.94 (*)    All other components within normal limits  CBC - Abnormal; Notable for the following:    RDW 16.1 (*)    Platelets 109 (*)    All other components within normal limits  URINALYSIS, ROUTINE W REFLEX MICROSCOPIC - Abnormal; Notable for the following:    Nitrite POSITIVE (*)    All other components within normal limits  URINE MICROSCOPIC-ADD ON - Abnormal; Notable for the following:    Squamous Epithelial / LPF FEW (*)    Bacteria, UA MANY (*)    All other components within normal limits  POCT I-STAT, CHEM 8 - Abnormal; Notable for the following:    Glucose, Bld 145 (*)    All other components within normal limits   Dg Chest 2 View  01/08/2013   *RADIOLOGY REPORT*  Clinical Data: Fall with posterior pelvic and right-sided hip pain. Shortness of breath.  CHEST - 2 VIEW  Comparison: 12/02/2012  Findings: Hyperinflation. Lateral view degraded by patient arm position.  Osteopenia.  Accentuation of expected thoracic kyphosis. Vertebral body height loss at an upper thoracic level is moderate and not significantly changed.   Midline trachea.  Moderate cardiomegaly with atherosclerosis in the transverse  aorta.  Mild right hemidiaphragm elevation. No pleural effusion or pneumothorax.  No congestive failure.  IMPRESSION: Cardiomegaly and hyperinflation, without acute process.  Osteopenia with a chronic moderate upper thoracic compression deformity.   Original Report Authenticated By: Jeronimo Greaves, M.D.   Dg Hip Complete Right  01/08/2013   *RADIOLOGY REPORT*  Clinical Data: Fall.  Posterior pelvic pain and right side.  RIGHT HIP - COMPLETE 2+ VIEW  Comparison: Abdominal pelvic CT of 12/13/2011  Findings: Surgical sutures project over the lower right abdomen/upper pelvis. Sacroiliac joints are symmetric.  Femoral heads are located. Fractures of the right superior inferior pubic rami.  No femoral neck fracture.  IMPRESSION: Acute right superior and inferior pubic rami fractures.  CT may be informative to evaluate for possible extension into the right hip joint.   Original Report Authenticated By: Jeronimo Greaves, M.D.   Ct Head Wo Contrast  01/08/2013   *RADIOLOGY REPORT*  Clinical Data: Recent traumatic injury with headache  CT HEAD WITHOUT CONTRAST  Technique:  Contiguous axial images were obtained from the base of the skull through the vertex without contrast.  Comparison: 05/19/08  Findings: The bony calvarium is intact.  Mild mucosal thickening is noted within the maxillary antra bilaterally.  The ventricles are normal size configuration.  Mild chronic white matter ischemic change is seen similar to that noted on prior exam.  No acute hemorrhage, acute infarction or space-occupying mass lesion is noted.  IMPRESSION: Chronic changes without acute abnormality.   Original Report Authenticated By: Alcide Clever, M.D.   Ct Hip Right Wo Contrast  01/08/2013   *RADIOLOGY REPORT*  Clinical Data: Right hip pain status post fall.  Question fracture.  CT OF THE RIGHT HIP WITHOUT CONTRAST  Technique:  Multidetector CT imaging was performed  according to the standard protocol. Multiplanar CT image reconstructions were also generated.  Comparison: Right hip and pelvic radiographs 01/08/2013.  Pelvic CT 12/13/2011.  Findings: Examination is limited to the right hip and inferior right hemi pelvis.  The entire pelvis is not imaged.  As demonstrated on the earlier radiographs, there are mildly displaced fractures involving the right superior and inferior pubic rami.  Neither fracture demonstrates intra-articular extension to the hip joint.  There is no evidence of femoral head/neck fracture or avascular necrosis.  Mild subchondral cyst formation is present anteriorly in the acetabulum.  There is no evidence of large pelvic hematoma or hip joint effusion.  IMPRESSION:  1.  Mildly displaced fractures of the right superior and inferior pubic rami.  No intra-articular extension identified. 2.  No evidence of proximal femur fracture.   Original Report Authenticated By: Carey Bullocks, M.D.     1. Nausea and vomiting   2. Pubic ramus fracture, right, closed, initial encounter       MDM  Will treat pain and obtain images and baseline labs.  Patient has evidence of right superior and inferior pubic rami fractures.  The patient was able to ambulate in the emergency department however when she was ambulating she became extremely nauseated and began vomiting again.  She's had several episodes of nausea and vomiting while in the emergency department.  Some of this may be related to pain.  Several repeat abdominal exams were performed and all of which were without significant tenderness.  I do not think there is an indication for CT her abdomen and pelvis at this time.  CT head is without acute cranial abnormality.  C-spine cleared by Nexus criteria.  Facial be admitted to the  hospitalist service.  Orthopedics will see the patient for her pubic rami fractures which are weightbearing as tolerated.  2:54 PM Spoke with hospitalist for dimension and Dr  Serita Grit PA (ortho) will consult in the emergency department      Lyanne Co, MD 01/09/13 [phone]

## 2013-01-08 NOTE — Progress Notes (Signed)
   CARE MANAGEMENT ED NOTE 01/08/2013  Patient:  Joanna Salas, Joanna Salas   Account Number:  [id number]  Date Initiated:  01/08/2013  Documentation initiated by:  Edd Arbour  Subjective/Objective Assessment:     Subjective/Objective Assessment Detail:   [facility] ED dx 1. Nausea and vomiting  2.  Pubic ramus fracture, right, closed, initial encounter     Action/Plan:   Action/Plan Detail:   Anticipated DC Date:  01/09/2013     Status Recommendation to Physician:   Result of Recommendation:    Other ED Services  Consult Working Plan   In-house referral  Clinical Social Worker   DC Associate Professor  Other  Outpatient Services - Pt will follow up   [facility] Choice  HOME HEALTH   Choice offered to / List presented to:       [facility] arranged  HH-1 RN  HH-2 PT  HH-4 NURSE'S AIDE  HH-6 SOCIAL WORKER  HH-3 OT       Status of service:  Completed, signed off  ED Comments:   ED Comments Detail:  01/08/13 1510 [facility] ED Cm consulted by EDP, Campos to assist with home health needs. CM and SW spoke with pt and her two daughter about resources avaiable Provided lists of private duty nursing and [place] home health agencies. CM reviewed in details medicare guidelines for admisstion, home health [facility] (length of stay in home, types of [facility] staff available, coverage, primary caregiver, up to 24 hrs before services may be started) and Private duty nursing (PDN-coverage, length of stay in the home types of staff available). CM reviewed HHRN, SW, Aide, and PT/OT.  CM spoke with EDP, Campos about Daughter's concern with pt nausea and inability to ambulate.  Pending choice of home health agency.  Daughter, Joanna Salas states pt will be able to stay with her after hospitalization but Daughter unable to assist pt with mobility becuase of a right femur fracture surgery recently. Other daughter Joanna Salas lives in [place] and is only visiting mother today.  Pt scheduled for observation admission.

## 2013-01-08 NOTE — ED Notes (Signed)
Pt c/o nausea.  Made EDP Campos aware. new orders given.

## 2013-01-08 NOTE — ED Notes (Signed)
JYN:[id number]<AO> Expected date:<BR> Expected time:<BR> Means of arrival:<BR> Comments:<BR> fall

## 2013-01-08 NOTE — Progress Notes (Signed)
Clinical Social Work Department BRIEF PSYCHOSOCIAL ASSESSMENT 01/08/2013  Patient:  Joanna Salas     Account Number:  [id number]     Admit date:  01/08/2013  Clinical Social Worker:  Doree Albee  Date/Time:  01/08/2013 04:59 PM  Referred by:  CSW  Date Referred:  01/08/2013 Referred for  SNF Placement   Other Referral:   Interview type:  Patient Other interview type:   and patient husband    PSYCHOSOCIAL DATA Living Status:  FACILITY Admitted from facility:  [facility] [place] Level of care:  Skilled Nursing Facility Primary support name:  Larey Dresser Primary support relationship to patient:  SPOUSE Degree of support available:   strong    CURRENT CONCERNS Current Concerns  Post-Acute Placement   Other Concerns:    SOCIAL WORK ASSESSMENT / PLAN CSW met with pt and pt husband at bedside to complete psychosocial assessment. CSW introduced self and csw role. Patient shared that she was recieving short term rehab at [facility] in [place] however was scheduled to discharge home today with home health. Patient shared that she is interested in still returning home with home health once medically stable. Patient stated she does not wish to return to [facility] if absolutely possible, stating she is ready to be home. CSW informed RN CM of patient home health needs. Patient is interested in utilizing home services from [place].   Assessment/plan status:  No Further Intervention Required Other assessment/ plan:   Information/referral to community resources:    PATIENT'S/FAMILY'S RESPONSE TO PLAN OF CARE: Patient and Pt husband plan for patient to return home with home health services once medically stable. Please reconsult csw if further needs arise.

## 2013-01-08 NOTE — Progress Notes (Signed)
[facility] ED CM spoke with Dr Elisabeth Pigeon  See updated EPIC orders

## 2013-01-08 NOTE — ED Notes (Signed)
Pt states that she also hit the back of her head when she fell off the step ladder.

## 2013-01-08 NOTE — ED Notes (Signed)
Pt up to ambulate, unable to walk. States that she is weak and unable to bear weight on her hip. MD notified.

## 2013-01-08 NOTE — ED Notes (Signed)
Per EMS pt comes from home where Pt was climbing step level and fell. Pt c/o right hip pain and is taking coumadin. Her levels were high last week so she had stopped taking coumadin and resumed taking again yesterday.  Pt has PMH Afib ad HTN

## 2013-01-08 NOTE — Progress Notes (Signed)
Choices offered   HOME HEALTH AGENCIES SERVING [place]   Agencies that are Medicare-Certified and are affiliated with [facility]  Telephone Number Address  [facility]   [facility] has ownership interest in this company; however, you are under no obligation to use this agency. [phone] or  [phone] [address] [place], [place] [address] [email or web address]   Agencies that are Medicare-Certified and are not affiliated with [facility]                                                                                 Home Health Agency Telephone Number Address  [facility] [phone] Fax [phone] [address], [address] [place], [place]  [address] [email or web address]  [facility] [phone] or [phone] Fax [phone] [address] [place], [place] [address] [email or web address]  [facility] [phone] Fax [phone] [address] [place], [place] [address] [email or web address]  [facility] [phone] Fax [phone] [address], [address] [place], [place]  [address] [email or web address]  [facility] [phone] Fax [phone] [address], [address] [place], [place] [address] [email or web address]  [facility] [phone] [address] [place], [place] [address] [email or web address]  [facility] [phone]  [address] [place], [place] [address] [email or web address]  [facility] [phone] or [phone] Fax number [phone] [address] [place], [place]  [address] [email or web address]  [facility] [phone] Fax [phone] [address] [place], [place]   [address]  [facility]  [phone] Fax [phone] [address] [place], [place] [address] [email or web address]   

## 2013-01-08 NOTE — Progress Notes (Deleted)
Clinical Social Work Department CLINICAL SOCIAL WORK PLACEMENT NOTE 01/08/2013  Patient:  Joanna Salas  Account Number:  [id number] Admit date:  01/08/2013  Clinical Social Worker:  Doree Albee  Date/time:  01/08/2013 06:01 PM  Clinical Social Work is seeking post-discharge placement for this patient at the following level of care:   SKILLED NURSING   (*CSW will update this form in Epic as items are completed)   01/08/2013  Patient/family provided with [facility] Department of Clinical Social Work's list of facilities offering this level of care within the geographic area requested by the patient (or if unable, by the patient's family).  01/08/2013  Patient/family informed of their freedom to choose among providers that offer the needed level of care, that participate in Medicare, Medicaid or managed care program needed by the patient, have an available bed and are willing to accept the patient.  01/08/2013  Patient/family informed of [facility]' ownership interest in [facility], as well as of the fact that they are under no obligation to receive care at this facility.  PASARR submitted to EDS on  PASARR number received from EDS on   FL2 transmitted to all facilities in geographic area requested by pt/family on   FL2 transmitted to all facilities within larger geographic area on   Patient informed that his/her managed care company has contracts with or will negotiate with  certain facilities, including the following:     Patient/family informed of bed offers received:   Patient chooses bed at  Physician recommends and patient chooses bed at    Patient to be transferred to  on   Patient to be transferred to facility by   The following physician request were entered in Epic:   Additional Comments:

## 2013-01-08 NOTE — Progress Notes (Signed)
Rad Tech, [place] on floor to do post op hip film,  Pt had not had surgery and had earlier films of hip/pelvis including a CT.  Order to be sent back to MD for review.

## 2013-01-08 NOTE — Progress Notes (Signed)
Coumadin restarted tonight after reading ortho's note with permission to restart. Ortho plans NO surgery. INR in am.  Jimmye Norman, NP [facility]

## 2013-01-09 ENCOUNTER — Ambulatory Visit: Payer: Medicare Other | Admitting: Pharmacist Clinician (PhC)/ Clinical Pharmacy Specialist

## 2013-01-09 DIAGNOSIS — N179 Acute kidney failure, unspecified: Secondary | ICD-10-CM

## 2013-01-09 LAB — CBC
Hemoglobin: 11.1 g/dL — ABNORMAL LOW (ref 12.0–15.0)
MCH: 28.3 pg (ref 26.0–34.0)
RBC: 3.92 MIL/uL (ref 3.87–5.11)

## 2013-01-09 LAB — BASIC METABOLIC PANEL
CO2: 28 mEq/L (ref 19–32)
Calcium: 8.5 mg/dL (ref 8.4–10.5)
Glucose, Bld: 118 mg/dL — ABNORMAL HIGH (ref 70–99)
Potassium: 4.2 mEq/L (ref 3.5–5.1)
Sodium: 138 mEq/L (ref 135–145)

## 2013-01-09 LAB — CK: Total CK: 56 U/L (ref 7–177)

## 2013-01-09 MED ORDER — WARFARIN 1.25 MG HALF TABLET
1.2500 mg | ORAL_TABLET | ORAL | Status: DC
  Administered 2013-01-09: 1.25 mg via ORAL
  Filled 2013-01-09: qty 1

## 2013-01-09 MED ORDER — WARFARIN 1.25 MG HALF TABLET
1.2500 mg | ORAL_TABLET | ORAL | Status: DC
  Filled 2013-01-09: qty 1

## 2013-01-09 MED ORDER — LORATADINE 10 MG PO TABS
10.0000 mg | ORAL_TABLET | Freq: Every day | ORAL | Status: DC
  Administered 2013-01-09 – 2013-01-11 (×3): 10 mg via ORAL
  Filled 2013-01-09 (×4): qty 1

## 2013-01-09 MED ORDER — SODIUM CHLORIDE 0.9 % IV SOLN
INTRAVENOUS | Status: DC
  Administered 2013-01-09: 12:00:00 via INTRAVENOUS
  Administered 2013-01-09: 100 mL via INTRAVENOUS
  Administered 2013-01-10 – 2013-01-12 (×2): via INTRAVENOUS

## 2013-01-09 MED ORDER — WARFARIN SODIUM 2.5 MG PO TABS: 2.5000 mg | ORAL_TABLET | ORAL | Status: DC

## 2013-01-09 MED ORDER — WARFARIN SODIUM 2.5 MG PO TABS
2.5000 mg | ORAL_TABLET | ORAL | Status: DC
  Filled 2013-01-09: qty 1

## 2013-01-09 NOTE — Progress Notes (Signed)
[facility] PROGRESS NOTE  Assessment/Plan: AKI (acute kidney injury): - check Una and Cr. Baseline Cr <1.0. - Will d/c ARB, start IV fluids and check a B-met in am. Avoid NSAID's - strict I and O's, bladder scan. Check Ck level. - if no improvement in her renal function check renal ultrasound. - In and out foley as needed.   Closed fracture of multiple pubic rami: - mechanical. - ortho recommended gradual progressive ambulation and bladder scan.  A fib: - rate control, cont coumadin   HTN: - control.   Code Status: Full  Family Communication: Pt at bedside  Disposition Plan: Admit for further evaluation   Consultants:  ortho  Procedures:  none  Antibiotics:  none (indicate start date, and stop date if known)  HPI/Subjective: She does not want the pain medication as it makes her sleepy.  Objective: Filed Vitals:   01/08/13 1659 01/08/13 1700 01/08/13 2201 01/09/13 0557  BP:   98/65 109/69  Pulse:  81 81 94  Temp:   98.7 F (37.1 C) 97.9 F (36.6 C)  TempSrc:   Oral Oral  Resp:  24 18 18   Height:  5' 6.5" (1.689 m)    Weight:  72.576 kg (160 lb)    SpO2: 93% 94% 95% 94%    Intake/Output Summary (Last 24 hours) at 01/09/13 0943 Last data filed at 01/09/13 0600  Gross per 24 hour  Intake    480 ml  Output    865 ml  Net   -385 ml   Filed Weights   01/08/13 1700  Weight: 72.576 kg (160 lb)    Exam:  General: Alert, awake, oriented x3, in no acute distress.  HEENT: No bruits, no goiter.  Heart: Regular rate and rhythm, without murmurs, rubs, gallops.  Lungs: Good air movement, clear to auscultation. Abdomen: Soft, nontender, nondistended, positive bowel sounds.  Neuro: Grossly intact, nonfocal.   Data Reviewed: Basic Metabolic Panel:  Recent Labs Lab 01/08/13 1042 01/09/13 0450  NA 141 138  K 3.5 4.2  CL 106 103  CO2  --  28  GLUCOSE 145* 118*  BUN 11 18  CREATININE 0.80 1.55*  CALCIUM  --  8.5   Liver Function  Tests: No results found for this basename: AST, ALT, ALKPHOS, BILITOT, PROT, ALBUMIN,  in the last 168 hours No results found for this basename: LIPASE, AMYLASE,  in the last 168 hours No results found for this basename: AMMONIA,  in the last 168 hours CBC:  Recent Labs Lab 01/08/13 1034 01/08/13 1042 01/09/13 0450  WBC 6.5  --  7.7  HGB 12.8 13.3 11.1*  HCT 39.2 39.0 35.5*  MCV 91.2  --  90.6  PLT 109*  --  88*   Cardiac Enzymes: No results found for this basename: CKTOTAL, CKMB, CKMBINDEX, TROPONINI,  in the last 168 hours BNP (last 3 results) No results found for this basename: PROBNP,  in the last 8760 hours CBG: No results found for this basename: GLUCAP,  in the last 168 hours  No results found for this or any previous visit (from the past 240 hour(s)).   Studies: Dg Chest 2 View  01/08/2013   *RADIOLOGY REPORT*  Clinical Data: Fall with posterior pelvic and right-sided hip pain. Shortness of breath.  CHEST - 2 VIEW  Comparison: 12/02/2012  Findings: Hyperinflation. Lateral view degraded by patient arm position.  Osteopenia.  Accentuation of expected thoracic kyphosis. Vertebral body height loss at an upper thoracic level is moderate  and not significantly changed.  Midline trachea.  Moderate cardiomegaly with atherosclerosis in the transverse aorta.  Mild right hemidiaphragm elevation. No pleural effusion or pneumothorax.  No congestive failure.  IMPRESSION: Cardiomegaly and hyperinflation, without acute process.  Osteopenia with a chronic moderate upper thoracic compression deformity.   Original Report Authenticated By: Jeronimo Greaves, M.D.   Dg Hip Complete Right  01/08/2013   *RADIOLOGY REPORT*  Clinical Data: Fall.  Posterior pelvic pain and right side.  RIGHT HIP - COMPLETE 2+ VIEW  Comparison: Abdominal pelvic CT of 12/13/2011  Findings: Surgical sutures project over the lower right abdomen/upper pelvis. Sacroiliac joints are symmetric.  Femoral heads are located. Fractures of  the right superior inferior pubic rami.  No femoral neck fracture.  IMPRESSION: Acute right superior and inferior pubic rami fractures.  CT may be informative to evaluate for possible extension into the right hip joint.   Original Report Authenticated By: Jeronimo Greaves, M.D.   Ct Head Wo Contrast  01/08/2013   *RADIOLOGY REPORT*  Clinical Data: Recent traumatic injury with headache  CT HEAD WITHOUT CONTRAST  Technique:  Contiguous axial images were obtained from the base of the skull through the vertex without contrast.  Comparison: 05/19/08  Findings: The bony calvarium is intact.  Mild mucosal thickening is noted within the maxillary antra bilaterally.  The ventricles are normal size configuration.  Mild chronic white matter ischemic change is seen similar to that noted on prior exam.  No acute hemorrhage, acute infarction or space-occupying mass lesion is noted.  IMPRESSION: Chronic changes without acute abnormality.   Original Report Authenticated By: Alcide Clever, M.D.   Ct Hip Right Wo Contrast  01/08/2013   *RADIOLOGY REPORT*  Clinical Data: Right hip pain status post fall.  Question fracture.  CT OF THE RIGHT HIP WITHOUT CONTRAST  Technique:  Multidetector CT imaging was performed according to the standard protocol. Multiplanar CT image reconstructions were also generated.  Comparison: Right hip and pelvic radiographs 01/08/2013.  Pelvic CT 12/13/2011.  Findings: Examination is limited to the right hip and inferior right hemi pelvis.  The entire pelvis is not imaged.  As demonstrated on the earlier radiographs, there are mildly displaced fractures involving the right superior and inferior pubic rami.  Neither fracture demonstrates intra-articular extension to the hip joint.  There is no evidence of femoral head/neck fracture or avascular necrosis.  Mild subchondral cyst formation is present anteriorly in the acetabulum.  There is no evidence of large pelvic hematoma or hip joint effusion.  IMPRESSION:  1.   Mildly displaced fractures of the right superior and inferior pubic rami.  No intra-articular extension identified. 2.  No evidence of proximal femur fracture.   Original Report Authenticated By: Carey Bullocks, M.D.    Scheduled Meds: . docusate sodium  100 mg Oral BID  . metoprolol  75 mg Oral BID  . raloxifene  60 mg Oral Daily  . verapamil  240 mg Oral Daily  . warfarin  1.25 mg Oral Q48H  . [START ON 01/10/2013] warfarin  2.5 mg Oral Q48H  . Warfarin - Pharmacist Dosing Inpatient   Does not apply q1800   Continuous Infusions:    Marinda Elk  [facility] Pager [phone]. If 8PM-8AM, please contact night-coverage at [email or web address], password [facility] 01/09/2013, 9:43 AM  LOS: 1 day

## 2013-01-09 NOTE — Progress Notes (Signed)
ANTICOAGULATION CONSULT NOTE   Pharmacy Consult for warfarin Indication: atrial fibrillation  Allergies  Allergen Reactions  . Pneumococcal Vaccines   . Atorvastatin Other (See Comments)    Severe Leg Cramps  . Morphine Sulfate Nausea Only  . Penicillins     REACTION: unspecified  . Prednisone   . Sulfonamide Derivatives Nausea Only    Patient Measurements: Height: 5' 6.5" (168.9 cm) Weight: 160 lb (72.576 kg) IBW/kg (Calculated) : 60.45   Vital Signs: Temp: 97.9 F (36.6 C) (05/29 0557) Temp src: Oral (05/29 0557) BP: 109/69 mmHg (05/29 0557) Pulse Rate: 94 (05/29 0557)  Labs:  Recent Labs  01/08/13 1034 01/08/13 1042 01/09/13 0450  HGB 12.8 13.3 11.1*  HCT 39.2 39.0 35.5*  PLT 109*  --  88*  LABPROT 21.4*  --  21.9*  INR 1.94*  --  2.00*  CREATININE  --  0.80 1.55*    Estimated Creatinine Clearance: 27.4 ml/min (by C-G formula based on Cr of 1.55).   Medical History: Past Medical History  Diagnosis Date  . Personal history of colonic polyps   . Atrial fibrillation   . Allergic rhinitis, cause unspecified   . Unspecified asthma(493.90)   . Personal history of other diseases of digestive system   . Personal history of urinary calculi   . Osteoporosis, unspecified   . DJD (degenerative joint disease)   . HTN (hypertension)   . Hyperlipidemia   . Anxiety state, unspecified     Medications:  Prescriptions prior to admission  Medication Sig Dispense Refill  . acetaminophen (TYLENOL) 500 MG tablet Take 1,000 mg by mouth every 6 (six) hours as needed for pain.      Marland Kitchen albuterol (PROAIR HFA) 108 (90 BASE) MCG/ACT inhaler Inhale 2 puffs into the lungs every 4 (four) hours as needed for wheezing or shortness of breath.  8.5 g  prn  . ALPRAZolam (XANAX) 0.25 MG tablet Take 0.25 mg by mouth at bedtime as needed for sleep.      . cetirizine (ZYRTEC) 10 MG tablet Take 10 mg by mouth at bedtime.      . meclizine (ANTIVERT) 25 MG tablet Take 25-50 mg by mouth 3  (three) times daily as needed for dizziness.      . metoprolol (LOPRESSOR) 50 MG tablet Take 75 mg by mouth 2 (two) times daily.       . ondansetron (ZOFRAN) 4 MG tablet Take 4 mg by mouth every 6 (six) hours as needed for nausea.      . promethazine (PHENERGAN) 12.5 MG tablet Take 12.5-25 mg by mouth at bedtime as needed for nausea (dizziness).      . raloxifene (EVISTA) 60 MG tablet Take 60 mg by mouth daily.        . valsartan (DIOVAN) 320 MG tablet Take 320 mg by mouth daily.        . verapamil (VERELAN PM) 240 MG 24 hr capsule Take 240 mg by mouth daily.        Marland Kitchen warfarin (COUMADIN) 2.5 MG tablet Take 1.25-2.5 mg by mouth daily. 0.5 tab on Sun, Tues, and Thurs; 1 tab all other days.       Scheduled:  . docusate sodium  100 mg Oral BID  . metoprolol  75 mg Oral BID  . raloxifene  60 mg Oral Daily  . verapamil  240 mg Oral Daily  . warfarin  1.25 mg Oral Custom  . [START ON 01/10/2013] warfarin  2.5 mg Oral Custom  .  Warfarin - Pharmacist Dosing Inpatient   Does not apply q1800    Assessment: [name] on chronic warfarin for atrial fibrillation admitted with pubi rami fractures after a fall.  Warfarin dosage PTA was 1.25 mg Sun, Tues, Thurs; 2.5 mg Mon, Wed, Fri, Sat.    No plans for surgery, so warfarin was resumed last night. INR therapeutic this AM.  Goal of Therapy:  INR 2-3    Plan:  1. Continue warfarin at home dosage (1.25 mg Sun, Tues, Thurs;  2.5 mg Mon, Wed, Fri, Sat). 2. INR daily while inpatient.   Elie Goody, PharmD, BCPS Pager: [phone] 01/09/2013  10:23 AM

## 2013-01-09 NOTE — Progress Notes (Addendum)
Patient ID: Joanna Salas, female   DOB: April 01, 1925, 79 y.o.   MRN: [id number] Subjective:    Admit day 5/28, awake and alert Ox4. Pain last night tylenol didn't help. Tramadol helped and I'm still sleepy, no nausea. I'm able to move my leg some. Tolerating po diet and starting to do well with po pain management.  Patient reports pain as mild.    Objective:   VITALS:  Temp:  [97.9 F (36.6 C)-98.7 F (37.1 C)] 97.9 F (36.6 C) (05/29 0557) Pulse Rate:  [66-129] 94 (05/29 0557) Resp:  [17-28] 18 (05/29 0557) BP: (98-159)/(57-88) 109/69 mmHg (05/29 0557) SpO2:  [62 %-95 %] 94 % (05/29 0557) Weight:  [72.576 kg (160 lb)] 72.576 kg (160 lb) (05/28 1700)  Neurologically intact ABD soft Intact pulses distally Dorsiflexion/Plantar flexion intact   LABS  Recent Labs  01/08/13 1034 01/08/13 1042 01/09/13 0450  HGB 12.8 13.3 11.1*  WBC 6.5  --  7.7  PLT 109*  --  88*    Recent Labs  01/08/13 1042 01/09/13 0450  NA 141 138  K 3.5 4.2  CL 106 103  CO2  --  28  BUN 11 18  CREATININE 0.80 1.55*  GLUCOSE 145* 118*    Recent Labs  01/08/13 1034 01/09/13 0450  INR 1.94* 2.00*   Sed rate is 2.  Vit D level pending.  Assessment/Plan:    Right multiple pubic rami fractures. Osteoporosis, check Vit D level.  Advance diet Up with therapy Discharge to SNF  Joanna Salas E 01/09/2013, 7:54 AM

## 2013-01-09 NOTE — Progress Notes (Signed)
ANTICOAGULATION CONSULT NOTE - Initial Consult  Pharmacy Consult for warfarin Indication: atrial fibrillation  Allergies  Allergen Reactions  . Pneumococcal Vaccines   . Atorvastatin Other (See Comments)    Severe Leg Cramps  . Morphine Sulfate Nausea Only  . Penicillins     REACTION: unspecified  . Prednisone   . Sulfonamide Derivatives Nausea Only    Patient Measurements: Height: 5' 6.5" (168.9 cm) Weight: 160 lb (72.576 kg) IBW/kg (Calculated) : 60.45 Heparin Dosing Weight:   Vital Signs: Temp: 98.7 F (37.1 C) (05/28 2201) Temp src: Oral (05/28 2201) BP: 98/65 mmHg (05/28 2201) Pulse Rate: 81 (05/28 2201)  Labs:  Recent Labs  01/08/13 1034 01/08/13 1042 01/09/13 0450  HGB 12.8 13.3 11.1*  HCT 39.2 39.0 35.5*  PLT 109*  --  88*  LABPROT 21.4*  --  21.9*  INR 1.94*  --  2.00*  CREATININE  --  0.80 1.55*    Estimated Creatinine Clearance: 27.4 ml/min (by C-G formula based on Cr of 1.55).   Medical History: Past Medical History  Diagnosis Date  . Personal history of colonic polyps   . Atrial fibrillation   . Allergic rhinitis, cause unspecified   . Unspecified asthma(493.90)   . Personal history of other diseases of digestive system   . Personal history of urinary calculi   . Osteoporosis, unspecified   . DJD (degenerative joint disease)   . HTN (hypertension)   . Hyperlipidemia   . Anxiety state, unspecified     Medications:  Prescriptions prior to admission  Medication Sig Dispense Refill  . acetaminophen (TYLENOL) 500 MG tablet Take 1,000 mg by mouth every 6 (six) hours as needed for pain.      Marland Kitchen albuterol (PROAIR HFA) 108 (90 BASE) MCG/ACT inhaler Inhale 2 puffs into the lungs every 4 (four) hours as needed for wheezing or shortness of breath.  8.5 g  prn  . ALPRAZolam (XANAX) 0.25 MG tablet Take 0.25 mg by mouth at bedtime as needed for sleep.      . cetirizine (ZYRTEC) 10 MG tablet Take 10 mg by mouth at bedtime.      . meclizine (ANTIVERT)  25 MG tablet Take 25-50 mg by mouth 3 (three) times daily as needed for dizziness.      . metoprolol (LOPRESSOR) 50 MG tablet Take 75 mg by mouth 2 (two) times daily.       . ondansetron (ZOFRAN) 4 MG tablet Take 4 mg by mouth every 6 (six) hours as needed for nausea.      . promethazine (PHENERGAN) 12.5 MG tablet Take 12.5-25 mg by mouth at bedtime as needed for nausea (dizziness).      . raloxifene (EVISTA) 60 MG tablet Take 60 mg by mouth daily.        . valsartan (DIOVAN) 320 MG tablet Take 320 mg by mouth daily.        . verapamil (VERELAN PM) 240 MG 24 hr capsule Take 240 mg by mouth daily.        Marland Kitchen warfarin (COUMADIN) 2.5 MG tablet Take 1.25-2.5 mg by mouth daily. 0.5 tab on Sun, Tues, and Thurs; 1 tab all other days.       Scheduled:  . docusate sodium  100 mg Oral BID  . irbesartan  300 mg Oral Daily  . metoprolol  75 mg Oral BID  . raloxifene  60 mg Oral Daily  . verapamil  240 mg Oral Daily  . warfarin  1.25 mg Oral  Q48H  . [START ON 01/10/2013] warfarin  2.5 mg Oral Q48H  . Warfarin - Pharmacist Dosing Inpatient   Does not apply q1800    Assessment: Patient with chronic warfarin for afib.  INR < 2 on admit.    Goal of Therapy:  INR 2-3    Plan:  Continue home warfarin dosing Daily INR  Darlina Guys, Jacquenette Shone Crowford 01/09/2013,5:57 AM

## 2013-01-09 NOTE — Progress Notes (Signed)
Clinical Social Work Department CLINICAL SOCIAL WORK PLACEMENT NOTE 01/09/2013  Patient:  Joanna Salas, Joanna Salas  Account Number:  [id number] Admit date:  01/08/2013  Clinical Social Worker:  Cori Razor, LCSW  Date/time:  01/09/2013 04:02 PM  Clinical Social Work is seeking post-discharge placement for this patient at the following level of care:   SKILLED NURSING   (*CSW will update this form in Epic as items are completed)   01/09/2013  Patient/family provided with [facility] Department of Clinical Social Work's list of facilities offering this level of care within the geographic area requested by the patient (or if unable, by the patient's family).  01/09/2013  Patient/family informed of their freedom to choose among providers that offer the needed level of care, that participate in Medicare, Medicaid or managed care program needed by the patient, have an available bed and are willing to accept the patient.    Patient/family informed of [facility]' ownership interest in [facility], as well as of the fact that they are under no obligation to receive care at this facility.  PASARR submitted to EDS on 01/09/2013 PASARR number received from [employer] on 01/09/2013  FL2 transmitted to all facilities in geographic area requested by pt/family on  01/09/2013 FL2 transmitted to all facilities within larger geographic area on   Patient informed that his/her managed care company has contracts with or will negotiate with  certain facilities, including the following:     Patient/family informed of bed offers received:   Patient chooses bed at  Physician recommends and patient chooses bed at    Patient to be transferred to  on   Patient to be transferred to facility by   The following physician request were entered in Epic:   Additional Comments:  Cori Razor LCSW [phone]

## 2013-01-09 NOTE — Progress Notes (Signed)
Paged MD Robb Matar regarding patient is unable to voided after foley removal, bladder scanned patient and , awaiting on callback Stanford Breed RN 01-09-2013 18:45pm

## 2013-01-09 NOTE — Evaluation (Addendum)
Physical Therapy Evaluation Patient Details Name: Joanna Salas MRN: [id number] DOB: 05-08-1925 Today's Date: 01/09/2013 Time: 6962-9528 PT Time Calculation (min): 20 min  PT Assessment / Plan / Recommendation Clinical Impression  79 y.o. female admitted after fall from stepladder in which she sustained R superior and inferior pubic rami fxs.  Pt was independent with mobility PTA, now requires mod A for bed mobility and to transfer bed to chair. Pt quite groggy, likely due to meds. Expect improved mobility once meds wear off. SaO2 74% on RA, 92% on 2L O2 [place]. RN notified.  Pt lives alone, will need ST-SNF. She would benefit from acute PT to maximize safety and independence with mobility.     PT Assessment  Patient needs continued PT services    Follow Up Recommendations  SNF    Does the patient have the potential to tolerate intense rehabilitation      Barriers to Discharge        Equipment Recommendations  Rolling walker with 5" wheels    Recommendations for Other Services     Frequency Min 4X/week    Precautions / Restrictions Precautions Precautions: Fall Restrictions Weight Bearing Restrictions: No Other Position/Activity Restrictions: WBAT   Pertinent Vitals/Pain *no pain at rest SaO2 74% on RA after activity, 92% on 2L O2 [place], RN notified**      Mobility  Bed Mobility Bed Mobility: Supine to Sit;Sitting - Scoot to Edge of Bed Supine to Sit: With rails;3: Mod assist Sitting - Scoot to Edge of Bed: 3: Mod assist Details for Bed Mobility Assistance: mod A to elevate trunk and mod A with pad to scoot to EOB Transfers Transfers: Sit to Stand;Stand to Sit Sit to Stand: 3: Mod assist;From bed;With upper extremity assist Stand to Sit: 4: Min assist;To chair/3-in-1;With upper extremity assist;With armrests Details for Transfer Assistance: VCs hand placement, mod A to rise Ambulation/Gait Ambulation/Gait Assistance: 4: Min assist Ambulation Distance (Feet): 4  Feet Assistive device: Rolling walker Ambulation/Gait Assistance Details: VCs seqeuncing, min A balance, 2 steps forward, 3 steps backward to chair Gait Pattern: Step-to pattern;Decreased step length - right;Decreased step length - left Gait velocity: decreased, pt lethargic    Exercises     PT Diagnosis: Difficulty walking;Generalized weakness;Acute pain  PT Problem List: Decreased activity tolerance;Decreased mobility;Decreased knowledge of use of DME;Cardiopulmonary status limiting activity PT Treatment Interventions: DME instruction;Gait training;Functional mobility training;Therapeutic activities;Therapeutic exercise;Patient/family education   PT Goals Acute Rehab PT Goals PT Goal Formulation: With patient Time For Goal Achievement: 01/23/13 Potential to Achieve Goals: Good Pt will go Supine/Side to Sit: with supervision;with rail PT Goal: Supine/Side to Sit - Progress: Goal set today Pt will go Sit to Stand: with supervision PT Goal: Sit to Stand - Progress: Goal set today Pt will Ambulate: with rolling walker;51 - 150 feet;with supervision PT Goal: Ambulate - Progress: Goal set today  Visit Information  Last PT Received On: 01/09/13 Assistance Needed: +2    Subjective Data  Subjective: That medicine is making me so sleepy. When will it wear off? I don't want any more of that.  Patient Stated Goal: DC to SNF   Prior Functioning  Home Living Lives With: Alone Available Help at Discharge: Family Type of Home: House Home Access: Stairs to enter Secretary/administrator of Steps: 2 Home Layout: One level Home Adaptive Equipment: Walker - rolling Prior Function Level of Independence: Independent Able to Take Stairs?: Yes Driving: Yes Communication Communication: No difficulties    Cognition  Cognition Arousal/Alertness: Suspect due  to medications (lethargic) Behavior During Therapy: WFL for tasks assessed/performed Overall Cognitive Status: Within Functional Limits  for tasks assessed    Extremity/Trunk Assessment Right Upper Extremity Assessment RUE ROM/Strength/Tone: [facility] for tasks assessed Left Upper Extremity Assessment LUE ROM/Strength/Tone: WFL for tasks assessed Right Lower Extremity Assessment RLE ROM/Strength/Tone: WFL for tasks assessed RLE Sensation: WFL - Light Touch RLE Coordination: WFL - gross/fine motor Left Lower Extremity Assessment LLE ROM/Strength/Tone: WFL for tasks assessed LLE Sensation: WFL - Light Touch LLE Coordination: WFL - gross/fine motor Trunk Assessment Trunk Assessment: Kyphotic   Balance Balance Balance Assessed: Yes Static Sitting Balance Static Sitting - Balance Support: Bilateral upper extremity supported;Feet supported Static Sitting - Level of Assistance: 5: Stand by assistance Static Sitting - Comment/# of Minutes: 2  End of Session PT - End of Session Equipment Utilized During Treatment: Gait belt Activity Tolerance: Patient limited by fatigue Patient left: in chair;with call bell/phone within reach Nurse Communication: Mobility status;Other (comment) (SaO2 74% on RA, 92% on 2L O2 [place])  GP     Tamala Ser 01/09/2013, 9:56 AM [phone]

## 2013-01-09 NOTE — Progress Notes (Signed)
OT Cancellation Note  Patient Details Name: Joanna Salas MRN: [id number] DOB: 07-24-1925   Cancelled Treatment:     Pt plans snf for rehab.  Will defer OT eval to that venue.    Omaira Mellen 01/09/2013, 12:08 PM Marica Otter, OTR/L [phone] 01/09/2013

## 2013-01-10 DIAGNOSIS — Z7901 Long term (current) use of anticoagulants: Secondary | ICD-10-CM

## 2013-01-10 LAB — BASIC METABOLIC PANEL
Chloride: 99 mEq/L (ref 96–112)
GFR calc Af Amer: 46 mL/min — ABNORMAL LOW (ref >90)
GFR calc non Af Amer: 40 mL/min — ABNORMAL LOW (ref >90)
Potassium: 4.4 mEq/L (ref 3.5–5.1)
Sodium: 134 mEq/L — ABNORMAL LOW (ref 135–145)

## 2013-01-10 LAB — PROTIME-INR: Prothrombin Time: 28.8 seconds — ABNORMAL HIGH (ref 11.6–15.2)

## 2013-01-10 LAB — CREATININE, URINE, RANDOM: Creatinine, Urine: 206.6 mg/dL

## 2013-01-10 LAB — CBC
MCHC: 32.3 g/dL (ref 30.0–36.0)
Platelets: 93 10*3/uL — ABNORMAL LOW (ref 150–400)
RDW: 16.4 % — ABNORMAL HIGH (ref 11.5–15.5)
WBC: 8.6 10*3/uL (ref 4.0–10.5)

## 2013-01-10 MED ORDER — WARFARIN 0.5 MG HALF TABLET
0.5000 mg | ORAL_TABLET | Freq: Once | ORAL | Status: AC
  Administered 2013-01-10: 0.5 mg via ORAL
  Filled 2013-01-10: qty 1

## 2013-01-10 MED ORDER — SIMETHICONE 80 MG PO CHEW
80.0000 mg | CHEWABLE_TABLET | Freq: Four times a day (QID) | ORAL | Status: DC | PRN
  Filled 2013-01-10: qty 1

## 2013-01-10 MED ORDER — POLYETHYLENE GLYCOL 3350 17 G PO PACK
17.0000 g | PACK | Freq: Every day | ORAL | Status: DC
  Administered 2013-01-10: 17 g via ORAL
  Filled 2013-01-10 (×3): qty 1

## 2013-01-10 MED ORDER — MAGNESIUM HYDROXIDE 400 MG/5ML PO SUSP: 30.0000 mL | Freq: Every day | ORAL | Status: DC | PRN

## 2013-01-10 NOTE — Discharge Summary (Addendum)
Physician Discharge Summary  Joanna Salas GEX:[id number] DOB: [birth year] DOA: 01/08/2013  PCP: Willow Ora, MD  Admit date: 01/08/2013 Discharge date: 01/11/2013  Time spent: 40 minutes  Recommendations for Outpatient Follow-up:  1. Follow up with PCP 2. Follow up with Ortho   Discharge Diagnoses:  Principal Problem:   Closed fracture of multiple pubic rami Active Problems:   AKI (acute kidney injury)   Discharge Condition: stable  Diet recommendation: Heart healthy  Filed Weights   01/08/13 1700  Weight: 72.576 kg (160 lb)    History of present illness:  79 year old female with past medical history of atrial fibrillation on Coumadin, hypertension who presented to [facility] ED STATUS post fall at home. Patient reported no prodromal symptoms prior to fall such as chest pain, shortness of breath or palpitations. No lightheadedness, no dizziness or loss of consciousness. Patient did not have complaints of abdominal pain, nausea or vomiting but did have an episode of vomiting in ED. No diarrhea or constipation. No blood in the stool or urine.  In ED, vital signs remained stable with blood pressure 119/57, heart rate 66, Tmax 98.6 F and O2 saturation of 94%. Her CBC was significant for platelet count of 109. BMP was unremarkable. INR on this admission is 1.94. Further evaluation included CT head which was negative for acute intracranial findings. X-ray of the right hip shows superior and inferior pubic rami fracture and a CT of the right hip confirms displaced right pubic rami fracture.    Hospital Course:  AKI (acute kidney injury):  - Una and Cr compatible with pre-renal. Baseline Cr <1.0. creatinine improved with IV fluids  - Will d/c ARB, Avoid NSAID's  - resume ARb at facility.  Closed fracture of multiple pubic rami:  - mechanical.  - ortho recommended gradual progressive ambulation and bladder scan.   Nausea and vomiting due to UTI:  - cipro, cont zofran, phenergan. Resolved. -  pt experience a fall on coumadin check CT head negative.  A fib:  - rate control. - INR therapeutic.  HTN:  - control.  Procedures: Ct hip and pelvis 5.30.2014: Mildly displaced fractures of the right superior and inferior pubic rami   Consultations:  ortho  Discharge Exam: Filed Vitals:   01/14/13 0507 01/14/13 0800 01/14/13 1118 01/14/13 1400  BP: 145/90   107/70  Pulse: 103   68  Temp: 97.8 F (36.6 C)   97.4 F (36.3 C)  TempSrc: Oral   Oral  Resp: 18 18 16 18   Height:      Weight:      SpO2: 95%   99%    General: A&O x3 Cardiovascular: RRR Respiratory: good air movement CTA B/L  Discharge Instructions      Discharge Orders   Future Appointments Provider Department Dept Phone   02/03/2013 10:15 AM Waymon Budge, MD [facility] [phone]   Future Orders Complete By Expires     Diet - low sodium heart healthy  As directed     Diet - low sodium heart healthy  As directed     Increase activity slowly  As directed     Increase activity slowly  As directed     Weight bearing as tolerated  As directed         Medication List    TAKE these medications       acetaminophen 500 MG tablet  Commonly known as:  TYLENOL  Take 1,000 mg by mouth every 6 (six) hours as needed  for pain.     albuterol 108 (90 BASE) MCG/ACT inhaler  Commonly known as:  PROAIR HFA  Inhale 2 puffs into the lungs every 4 (four) hours as needed for wheezing or shortness of breath.     ALPRAZolam 0.25 MG tablet  Commonly known as:  XANAX  Take 1 tablet (0.25 mg total) by mouth at bedtime as needed for sleep.     ALPRAZolam 0.25 MG tablet  Commonly known as:  XANAX  Take 1 tablet (0.25 mg total) by mouth at bedtime as needed.     alum & mag hydroxide-simeth 200-200-20 MG/5ML suspension  Commonly known as:  MAALOX/MYLANTA  Take 15 mLs by mouth every 4 (four) hours as needed.     cetirizine 10 MG tablet  Commonly known as:  ZYRTEC  Take 10 mg by mouth at bedtime.      ciprofloxacin 500 MG tablet  Commonly known as:  CIPRO  Take 1 tablet (500 mg total) by mouth 2 (two) times daily.     meclizine 25 MG tablet  Commonly known as:  ANTIVERT  Take 25-50 mg by mouth 3 (three) times daily as needed for dizziness.     metoprolol 50 MG tablet  Commonly known as:  LOPRESSOR  Take 75 mg by mouth 2 (two) times daily.     ondansetron 4 MG tablet  Commonly known as:  ZOFRAN  Take 4 mg by mouth every 6 (six) hours as needed for nausea.     pantoprazole 40 MG tablet  Commonly known as:  PROTONIX  Take 1 tablet (40 mg total) by mouth daily.     polyethylene glycol packet  Commonly known as:  MIRALAX / GLYCOLAX  Take 17 g by mouth daily.     promethazine 12.5 MG tablet  Commonly known as:  PHENERGAN  Take 12.5-25 mg by mouth at bedtime as needed for nausea (dizziness).     raloxifene 60 MG tablet  Commonly known as:  EVISTA  Take 60 mg by mouth daily.     valsartan 320 MG tablet  Commonly known as:  DIOVAN  Take 320 mg by mouth daily.     verapamil 240 MG 24 hr capsule  Commonly known as:  VERELAN PM  Take 240 mg by mouth daily.     warfarin 2.5 MG tablet  Commonly known as:  COUMADIN  Take 1.25-2.5 mg by mouth daily. 0.5 tab on Sun, Tues, and Thurs; 1 tab all other days.       Allergies  Allergen Reactions  . Pneumococcal Vaccines   . Atorvastatin Other (See Comments)    Severe Leg Cramps  . Morphine Sulfate Nausea Only  . Penicillins     REACTION: unspecified  . Prednisone   . Sulfonamide Derivatives Nausea Only   Follow-up Information   Follow up with NITKA,JAMES E, MD. Schedule an appointment as soon as possible for a visit in 2 weeks.   Contact information:   [address] [place] [address] [place] [address] [phone]        The results of significant diagnostics from this hospitalization (including imaging, microbiology, ancillary and laboratory) are listed below for reference.    Significant Diagnostic Studies: Dg Chest 2  View  01/08/2013   *RADIOLOGY REPORT*  Clinical Data: Fall with posterior pelvic and right-sided hip pain. Shortness of breath.  CHEST - 2 VIEW  Comparison: 12/02/2012  Findings: Hyperinflation. Lateral view degraded by patient arm position.  Osteopenia.  Accentuation of expected thoracic kyphosis. Vertebral  body height loss at an upper thoracic level is moderate and not significantly changed.  Midline trachea.  Moderate cardiomegaly with atherosclerosis in the transverse aorta.  Mild right hemidiaphragm elevation. No pleural effusion or pneumothorax.  No congestive failure.  IMPRESSION: Cardiomegaly and hyperinflation, without acute process.  Osteopenia with a chronic moderate upper thoracic compression deformity.   Original Report Authenticated By: Jeronimo Greaves, M.D.   Dg Hip Complete Right  01/08/2013   *RADIOLOGY REPORT*  Clinical Data: Fall.  Posterior pelvic pain and right side.  RIGHT HIP - COMPLETE 2+ VIEW  Comparison: Abdominal pelvic CT of 12/13/2011  Findings: Surgical sutures project over the lower right abdomen/upper pelvis. Sacroiliac joints are symmetric.  Femoral heads are located. Fractures of the right superior inferior pubic rami.  No femoral neck fracture.  IMPRESSION: Acute right superior and inferior pubic rami fractures.  CT may be informative to evaluate for possible extension into the right hip joint.   Original Report Authenticated By: Jeronimo Greaves, M.D.   Ct Head Wo Contrast  01/08/2013   *RADIOLOGY REPORT*  Clinical Data: Recent traumatic injury with headache  CT HEAD WITHOUT CONTRAST  Technique:  Contiguous axial images were obtained from the base of the skull through the vertex without contrast.  Comparison: 05/19/08  Findings: The bony calvarium is intact.  Mild mucosal thickening is noted within the maxillary antra bilaterally.  The ventricles are normal size configuration.  Mild chronic white matter ischemic change is seen similar to that noted on prior exam.  No acute  hemorrhage, acute infarction or space-occupying mass lesion is noted.  IMPRESSION: Chronic changes without acute abnormality.   Original Report Authenticated By: Alcide Clever, M.D.   Ct Hip Right Wo Contrast  01/08/2013   *RADIOLOGY REPORT*  Clinical Data: Right hip pain status post fall.  Question fracture.  CT OF THE RIGHT HIP WITHOUT CONTRAST  Technique:  Multidetector CT imaging was performed according to the standard protocol. Multiplanar CT image reconstructions were also generated.  Comparison: Right hip and pelvic radiographs 01/08/2013.  Pelvic CT 12/13/2011.  Findings: Examination is limited to the right hip and inferior right hemi pelvis.  The entire pelvis is not imaged.  As demonstrated on the earlier radiographs, there are mildly displaced fractures involving the right superior and inferior pubic rami.  Neither fracture demonstrates intra-articular extension to the hip joint.  There is no evidence of femoral head/neck fracture or avascular necrosis.  Mild subchondral cyst formation is present anteriorly in the acetabulum.  There is no evidence of large pelvic hematoma or hip joint effusion.  IMPRESSION:  1.  Mildly displaced fractures of the right superior and inferior pubic rami.  No intra-articular extension identified. 2.  No evidence of proximal femur fracture.   Original Report Authenticated By: Carey Bullocks, M.D.    Microbiology: Recent Results (from the past 240 hour(s))  URINE CULTURE     Status: None   Collection Time    01/08/13  3:03 PM      Result Value Range Status   Specimen Description URINE, CATHETERIZED   Final   Special Requests NONE   Final   Culture  Setup Time 01/09/2013 02:00   Final   Colony Count >=100,000 COLONIES/ML   Final   Culture [facility] MORGANII   Final   Report Status 01/11/2013 FINAL   Final   Organism ID, Bacteria MORGANELLA MORGANII   Final  URINE CULTURE     Status: None   Collection Time    01/11/13 11:17  AM      Result Value Range Status    Specimen Description URINE, RANDOM   Final   Special Requests NONE   Final   Culture  Setup Time 01/11/2013 17:49   Final   Colony Count >=100,000 COLONIES/ML   Final   Culture ESCHERICHIA COLI   Final   Report Status 01/14/2013 FINAL   Final   Organism ID, Bacteria ESCHERICHIA COLI   Final     Labs: Basic Metabolic Panel:  Recent Labs Lab 01/09/13 0450 01/10/13 0420 01/11/13 0517 01/12/13 0709 01/13/13 0422  NA 138 134* 132* 134* 134*  K 4.2 4.4 4.4 4.9 4.8  CL 103 99 98 101 103  CO2 28 30 22 24 21   GLUCOSE 118* 125* 119* 146* 109*  BUN 18 22 25* 32* 34*  CREATININE 1.55* 1.21* 1.29* 1.34* 1.13*  CALCIUM 8.5 8.4 8.7 8.2* 8.3*   Liver Function Tests: No results found for this basename: AST, ALT, ALKPHOS, BILITOT, PROT, ALBUMIN,  in the last 168 hours No results found for this basename: LIPASE, AMYLASE,  in the last 168 hours No results found for this basename: AMMONIA,  in the last 168 hours CBC:  Recent Labs Lab 01/08/13 1034 01/08/13 1042 01/09/13 0450 01/10/13 0420 01/11/13 0517 01/12/13 0709  WBC 6.5  --  7.7 8.6 9.5 7.1  HGB 12.8 13.3 11.1* 10.8* 11.0* 11.1*  HCT 39.2 39.0 35.5* 33.4* 35.0* 33.6*  MCV 91.2  --  90.6 93.0 90.9 90.3  PLT 109*  --  88* 93* 109* 134*   Cardiac Enzymes:  Recent Labs Lab 01/09/13 0450 01/13/13 1433 01/13/13 2018 01/14/13 0206  CKTOTAL 56  --   --   --   TROPONINI  --  <0.30 <0.30 <0.30   BNP: BNP (last 3 results) No results found for this basename: PROBNP,  in the last 8760 hours CBG: No results found for this basename: GLUCAP,  in the last 168 hours     Signed:  Marinda Elk  [facility] 01/14/2013, 3:09 PM

## 2013-01-10 NOTE — Progress Notes (Signed)
Dr. Robb Matar on floor states to wait and do not insert foley catheter at this time, wait until about 1400 and bladder scan, only insert foley catheter if bladder scan >663ml, if not let patient urinate on her own. Informed MD that patient in and out catheterized at about 0630 after 8 hrs of no output and only out.

## 2013-01-10 NOTE — Progress Notes (Signed)
ANTICOAGULATION CONSULT NOTE   Pharmacy Consult for warfarin Indication: atrial fibrillation  Allergies  Allergen Reactions  . Pneumococcal Vaccines   . Atorvastatin Other (See Comments)    Severe Leg Cramps  . Morphine Sulfate Nausea Only  . Penicillins     REACTION: unspecified  . Prednisone   . Sulfonamide Derivatives Nausea Only    Patient Measurements: Height: 5' 6.5" (168.9 cm) Weight: 160 lb (72.576 kg) IBW/kg (Calculated) : 60.45   Vital Signs: Temp: 97.8 F (36.6 C) (05/30 0636) Temp src: Oral (05/30 0636) BP: 120/69 mmHg (05/30 0636) Pulse Rate: 96 (05/30 0636)  Labs:  Recent Labs  01/08/13 1034 01/08/13 1042 01/09/13 0450 01/10/13 0420  HGB 12.8 13.3 11.1* 10.8*  HCT 39.2 39.0 35.5* 33.4*  PLT 109*  --  88* 93*  LABPROT 21.4*  --  21.9* 28.8*  INR 1.94*  --  2.00* 2.90*  CREATININE  --  0.80 1.55* 1.21*  CKTOTAL  --   --  56  --     Estimated Creatinine Clearance: 35 ml/min (by C-G formula based on Cr of 1.21).   Medical History: Past Medical History  Diagnosis Date  . Personal history of colonic polyps   . Atrial fibrillation   . Allergic rhinitis, cause unspecified   . Unspecified asthma(493.90)   . Personal history of other diseases of digestive system   . Personal history of urinary calculi   . Osteoporosis, unspecified   . DJD (degenerative joint disease)   . HTN (hypertension)   . Hyperlipidemia   . Anxiety state, unspecified     Medications:  Prescriptions prior to admission  Medication Sig Dispense Refill  . acetaminophen (TYLENOL) 500 MG tablet Take 1,000 mg by mouth every 6 (six) hours as needed for pain.      Marland Kitchen albuterol (PROAIR HFA) 108 (90 BASE) MCG/ACT inhaler Inhale 2 puffs into the lungs every 4 (four) hours as needed for wheezing or shortness of breath.  8.5 g  prn  . ALPRAZolam (XANAX) 0.25 MG tablet Take 0.25 mg by mouth at bedtime as needed for sleep.      . cetirizine (ZYRTEC) 10 MG tablet Take 10 mg by mouth at  bedtime.      . meclizine (ANTIVERT) 25 MG tablet Take 25-50 mg by mouth 3 (three) times daily as needed for dizziness.      . metoprolol (LOPRESSOR) 50 MG tablet Take 75 mg by mouth 2 (two) times daily.       . ondansetron (ZOFRAN) 4 MG tablet Take 4 mg by mouth every 6 (six) hours as needed for nausea.      . promethazine (PHENERGAN) 12.5 MG tablet Take 12.5-25 mg by mouth at bedtime as needed for nausea (dizziness).      . raloxifene (EVISTA) 60 MG tablet Take 60 mg by mouth daily.        . valsartan (DIOVAN) 320 MG tablet Take 320 mg by mouth daily.        . verapamil (VERELAN PM) 240 MG 24 hr capsule Take 240 mg by mouth daily.        Marland Kitchen warfarin (COUMADIN) 2.5 MG tablet Take 1.25-2.5 mg by mouth daily. 0.5 tab on Sun, Tues, and Thurs; 1 tab all other days.       Scheduled:  . docusate sodium  100 mg Oral BID  . loratadine  10 mg Oral Daily  . metoprolol  75 mg Oral BID  . raloxifene  60 mg Oral Daily  .  verapamil  240 mg Oral Daily  . warfarin  1.25 mg Oral Custom  . warfarin  2.5 mg Oral Custom  . Warfarin - Pharmacist Dosing Inpatient   Does not apply q1800    Assessment:  [name] on chronic warfarin for atrial fibrillation admitted with pubi rami fractures after a fall.    Warfarin dosage PTA was 1.25 mg Sun, Tues, Thurs; 2.5 mg Mon, Wed, Fri, Sat.      No plans for surgery, so warfarin was resumed on admission.  INR therapeutic this AM but with quick increase from 2.0 --> 2.9.   Will discontinue home regimen and dose daily based on changes in INR  Plt low/stable, Hgb okay, no bleeding reported   No bleeding reported   Goal of Therapy:  INR 2-3    Plan:  1.) Low warfarin dose tonight 0.5 mg  2.) Daily PT/INR   Neveah Bang, Loma Messing PharmD Pager #: [phone] 8:35 AM 01/10/2013

## 2013-01-10 NOTE — Progress Notes (Signed)
[facility] PROGRESS NOTE  Assessment/Plan: AKI (acute kidney injury): - check Una and Cr. Baseline Cr <1.0. creatinine improved with IV fluids - Will d/c ARB, Avoid NSAID's - strict I and O's, bladder scan.    Closed fracture of multiple pubic rami: - mechanical. - ortho recommended gradual progressive ambulation and bladder scan.  A fib: - rate control, cont coumadin   HTN: - control.   Code Status: Full  Family Communication: Pt at bedside  Disposition Plan: SNF   Consultants:  ortho  Procedures:  none  Antibiotics:  none (indicate start date, and stop date if known)  HPI/Subjective: She does not want the pain medication as it makes her sleepy.  Objective: Filed Vitals:   01/09/13 0935 01/09/13 2110 01/10/13 0636 01/10/13 0934  BP:  104/67 120/69 122/76  Pulse:  86 96 96  Temp:  97.9 F (36.6 C) 97.8 F (36.6 C)   TempSrc:  Oral Oral   Resp:  16 18   Height:      Weight:      SpO2: 92% 92% 93%     Intake/Output Summary (Last 24 hours) at 01/10/13 1133 Last data filed at 01/10/13 1000  Gross per 24 hour  Intake   2345 ml  Output    401 ml  Net   1944 ml   Filed Weights   01/08/13 1700  Weight: 72.576 kg (160 lb)    Exam:  General: Alert, awake, oriented x3, in no acute distress.  HEENT: No bruits, no goiter.  Heart: Regular rate and rhythm, without murmurs, rubs, gallops.  Lungs: Good air movement, clear to auscultation. Abdomen: Soft, nontender, nondistended, positive bowel sounds.  Neuro: Grossly intact, nonfocal.   Data Reviewed: Basic Metabolic Panel:  Recent Labs Lab 01/08/13 1042 01/09/13 0450 01/10/13 0420  NA 141 138 134*  K 3.5 4.2 4.4  CL 106 103 99  CO2  --  28 30  GLUCOSE 145* 118* 125*  BUN 11 18 22   CREATININE 0.80 1.55* 1.21*  CALCIUM  --  8.5 8.4   Liver Function Tests: No results found for this basename: AST, ALT, ALKPHOS, BILITOT, PROT, ALBUMIN,  in the last 168 hours No results found for this  basename: LIPASE, AMYLASE,  in the last 168 hours No results found for this basename: AMMONIA,  in the last 168 hours CBC:  Recent Labs Lab 01/08/13 1034 01/08/13 1042 01/09/13 0450 01/10/13 0420  WBC 6.5  --  7.7 8.6  HGB 12.8 13.3 11.1* 10.8*  HCT 39.2 39.0 35.5* 33.4*  MCV 91.2  --  90.6 93.0  PLT 109*  --  88* 93*   Cardiac Enzymes:  Recent Labs Lab 01/09/13 0450  CKTOTAL 56   BNP (last 3 results) No results found for this basename: PROBNP,  in the last 8760 hours CBG: No results found for this basename: GLUCAP,  in the last 168 hours  Recent Results (from the past 240 hour(s))  URINE CULTURE     Status: None   Collection Time    01/08/13  3:03 PM      Result Value Range Status   Specimen Description URINE, CATHETERIZED   Final   Special Requests NONE   Final   Culture  Setup Time 01/09/2013 02:00   Final   Colony Count >=100,000 COLONIES/ML   Final   Culture GRAM NEGATIVE RODS   Final   Report Status PENDING   Incomplete     Studies: Ct Hip Right Wo Contrast  01/08/2013   *RADIOLOGY REPORT*  Clinical Data: Right hip pain status post fall.  Question fracture.  CT OF THE RIGHT HIP WITHOUT CONTRAST  Technique:  Multidetector CT imaging was performed according to the standard protocol. Multiplanar CT image reconstructions were also generated.  Comparison: Right hip and pelvic radiographs 01/08/2013.  Pelvic CT 12/13/2011.  Findings: Examination is limited to the right hip and inferior right hemi pelvis.  The entire pelvis is not imaged.  As demonstrated on the earlier radiographs, there are mildly displaced fractures involving the right superior and inferior pubic rami.  Neither fracture demonstrates intra-articular extension to the hip joint.  There is no evidence of femoral head/neck fracture or avascular necrosis.  Mild subchondral cyst formation is present anteriorly in the acetabulum.  There is no evidence of large pelvic hematoma or hip joint effusion.  IMPRESSION:   1.  Mildly displaced fractures of the right superior and inferior pubic rami.  No intra-articular extension identified. 2.  No evidence of proximal femur fracture.   Original Report Authenticated By: Carey Bullocks, M.D.    Scheduled Meds: . docusate sodium  100 mg Oral BID  . loratadine  10 mg Oral Daily  . metoprolol  75 mg Oral BID  . polyethylene glycol  17 g Oral Daily  . raloxifene  60 mg Oral Daily  . verapamil  240 mg Oral Daily  . warfarin  0.5 mg Oral ONCE-1800  . Warfarin - Pharmacist Dosing Inpatient   Does not apply q1800   Continuous Infusions: . sodium chloride 100 mL/hr at 01/10/13 0746     Marinda Elk  [facility] Pager [phone]. If 8PM-8AM, please contact night-coverage at [email or web address], password [facility] 01/10/2013, 11:33 AM  LOS: 2 days

## 2013-01-10 NOTE — Progress Notes (Signed)
Physical Therapy Treatment Patient Details Name: Joanna Salas MRN: [id number] DOB: March 01, 1925 Today's Date: 01/10/2013 Time: 1478-2956 PT Time Calculation (min): 35 min  PT Assessment / Plan / Recommendation Comments on Treatment Session  Mod assist for supine to sit, pt ambulated 7' with RW and min A. Nausea/fatigue limiting activity tolerance. SNF recommended.     Follow Up Recommendations  SNF     Does the patient have the potential to tolerate intense rehabilitation     Barriers to Discharge        Equipment Recommendations  Rolling walker with 5" wheels    Recommendations for Other Services    Frequency Min 4X/week   Plan Discharge plan remains appropriate;Frequency remains appropriate    Precautions / Restrictions Restrictions Weight Bearing Restrictions: Yes RLE Weight Bearing: Weight bearing as tolerated   Pertinent Vitals/Pain **SaO2 94% on 2L O2 [place], 84% on RA with activity No pain at rest*    Mobility  Bed Mobility Bed Mobility: Supine to Sit;Sitting - Scoot to Edge of Bed Supine to Sit: With rails;3: Mod assist Sitting - Scoot to Edge of Bed: 3: Mod assist Details for Bed Mobility Assistance: mod A to elevate trunk and mod A with pad to scoot to EOB Transfers Transfers: Sit to Stand;Stand to Sit Sit to Stand: 3: Mod assist;From bed;With upper extremity assist Stand to Sit: 4: Min assist;To chair/3-in-1;With upper extremity assist;With armrests Details for Transfer Assistance: VCs hand placement, mod A to rise Ambulation/Gait Ambulation/Gait Assistance: 4: Min assist Ambulation Distance (Feet): 7 Feet Assistive device: Rolling walker Gait Pattern: Step-to pattern;Decreased step length - right;Decreased step length - left Gait velocity: decreased, distance limited by fatigue/nausea    Exercises General Exercises - Lower Extremity Long Arc Quad: AROM;Right;10 reps;Seated   PT Diagnosis:    PT Problem List:   PT Treatment Interventions:     PT  Goals Acute Rehab PT Goals PT Goal Formulation: With patient Time For Goal Achievement: 01/23/13 Potential to Achieve Goals: Good Pt will go Supine/Side to Sit: with supervision;with rail PT Goal: Supine/Side to Sit - Progress: Progressing toward goal Pt will go Sit to Stand: with supervision PT Goal: Sit to Stand - Progress: Progressing toward goal Pt will Ambulate: with rolling walker;51 - 150 feet;with supervision PT Goal: Ambulate - Progress: Progressing toward goal  Visit Information  Last PT Received On: 01/10/13 Assistance Needed: +1    Subjective Data  Subjective: I'm sick to my stomach.  Patient Stated Goal: to sit around and do nothing   Cognition  Cognition Arousal/Alertness: Awake/alert (lethargic) Behavior During Therapy: WFL for tasks assessed/performed Overall Cognitive Status: Within Functional Limits for tasks assessed    Balance     End of Session PT - End of Session Equipment Utilized During Treatment: Gait belt Activity Tolerance: Patient limited by fatigue Patient left: in chair;with call bell/phone within reach;with family/visitor present Nurse Communication: Mobility status;Other (comment) (SaO2 84% on RA with activity, 94% on 2L O2 [place])   GP     Ralene Bathe Kistler 01/10/2013, 10:30 AM [phone]

## 2013-01-10 NOTE — Progress Notes (Signed)
CSW assisting with d/c planning. Pt has a ST SNF bed at [facility] if pt is stable for d/c. D/C Summary faxed to [facility]. Week end CSW is available to assist with d/c planning. Pt / family has been updated.  Cori Razor LCSW [phone]

## 2013-01-10 NOTE — Progress Notes (Addendum)
Paged Dr. Robb Matar that bladder scan . MD called back states to do not in and out catheterize, informed him patient up to [facility] but unable to urinate, MD states to bladder scan again in 8 hours.

## 2013-01-10 NOTE — Progress Notes (Signed)
Patient ID: CHENILLE TOOR, female   DOB: Jan 20, 1925, 79 y.o.   MRN: [id number] Subjective:    Awake, alert and Ox4. I feel nauseated, not eating much, flatus+. No BM yet, normally She is regular. PT recommends SNF. OT saw briefly and recommended followup at SNF. Not voiding spontaneously, receiving in and out catheter to pass urine. Not taking much Narcotic pain med.   Patient reports pain as mild.    Objective:   VITALS:  Temp:  [97.8 F (36.6 C)-97.9 F (36.6 C)] 97.8 F (36.6 C) (05/30 0636) Pulse Rate:  [86-96] 96 (05/30 0636) Resp:  [16-18] 18 (05/30 0636) BP: (104-120)/(67-69) 120/69 mmHg (05/30 0636) SpO2:  [74 %-93 %] 93 % (05/30 0636)  Neurologically intact ABD soft Neurovascular intact Intact pulses distally Dorsiflexion/Plantar flexion intact Abdomen is distended mildly.   LABS  Recent Labs  01/08/13 1034 01/08/13 1042 01/09/13 0450 01/10/13 0420  HGB 12.8 13.3 11.1* 10.8*  WBC 6.5  --  7.7 8.6  PLT 109*  --  88* 93*    Recent Labs  01/09/13 0450 01/10/13 0420  NA 138 134*  K 4.2 4.4  CL 103 99  CO2 28 30  BUN 18 22  CREATININE 1.55* 1.21*  GLUCOSE 118* 125*    Recent Labs  01/09/13 0450 01/10/13 0420  INR 2.00* 2.90*     Assessment/Plan:    Right superior/inferior pubic rami fractures Anemia of acute blood loss,mild Urinary retention due to pelvis fracture and medication. Constipation, mild abdomenal distention, decreased appetite, may be early ileus due to pelvis fx.  Up with therapy Continue foley due to acute urinary retention, strict I&O and urinary output monitoring Discharge to SNF when medically stable. I will be out of town but my partners are available or my PA Ermalene Postin Office phone [phone]. NITKA,JAMES E 01/10/2013, 8:30 AM

## 2013-01-11 ENCOUNTER — Inpatient Hospital Stay (HOSPITAL_COMMUNITY): Payer: Medicare Other

## 2013-01-11 LAB — CBC
MCV: 90.9 fL (ref 78.0–100.0)
Platelets: 109 10*3/uL — ABNORMAL LOW (ref 150–400)
RBC: 3.85 MIL/uL — ABNORMAL LOW (ref 3.87–5.11)
RDW: 16.3 % — ABNORMAL HIGH (ref 11.5–15.5)
WBC: 9.5 10*3/uL (ref 4.0–10.5)

## 2013-01-11 LAB — URINALYSIS, ROUTINE W REFLEX MICROSCOPIC
Bilirubin Urine: NEGATIVE
Glucose, UA: NEGATIVE mg/dL
Specific Gravity, Urine: 1.015 (ref 1.005–1.030)
pH: 5 (ref 5.0–8.0)

## 2013-01-11 LAB — BASIC METABOLIC PANEL
CO2: 22 mEq/L (ref 19–32)
Chloride: 98 mEq/L (ref 96–112)
GFR calc non Af Amer: 37 mL/min — ABNORMAL LOW (ref >90)
Glucose, Bld: 119 mg/dL — ABNORMAL HIGH (ref 70–99)
Potassium: 4.4 mEq/L (ref 3.5–5.1)
Sodium: 132 mEq/L — ABNORMAL LOW (ref 135–145)

## 2013-01-11 LAB — URINE CULTURE: Colony Count: >=100000

## 2013-01-11 LAB — PROTIME-INR: Prothrombin Time: 32.4 seconds — ABNORMAL HIGH (ref 11.6–15.2)

## 2013-01-11 LAB — URINE MICROSCOPIC-ADD ON

## 2013-01-11 MED ORDER — ALPRAZOLAM 0.25 MG PO TABS: 0.2500 mg | ORAL_TABLET | Freq: Every evening | ORAL | Status: DC | PRN

## 2013-01-11 MED ORDER — SODIUM CHLORIDE 0.9 % IV BOLUS (SEPSIS)
500.0000 mL | Freq: Once | INTRAVENOUS | Status: AC
  Administered 2013-01-11: 500 mL via INTRAVENOUS

## 2013-01-11 MED ORDER — CIPROFLOXACIN HCL 500 MG PO TABS: 500.0000 mg | ORAL_TABLET | Freq: Two times a day (BID) | ORAL | Status: DC

## 2013-01-11 MED ORDER — ONDANSETRON HCL 4 MG/2ML IJ SOLN
INTRAMUSCULAR | Status: AC
  Administered 2013-01-11: 4 mg
  Filled 2013-01-11: qty 2

## 2013-01-11 MED ORDER — POLYETHYLENE GLYCOL 3350 17 G PO PACK: 17.0000 g | PACK | Freq: Every day | ORAL | Status: DC

## 2013-01-11 MED ORDER — PANTOPRAZOLE SODIUM 40 MG IV SOLR
40.0000 mg | Freq: Once | INTRAVENOUS | Status: AC
  Administered 2013-01-11: 40 mg via INTRAVENOUS
  Filled 2013-01-11: qty 40

## 2013-01-11 MED ORDER — CIPROFLOXACIN IN D5W 400 MG/200ML IV SOLN
400.0000 mg | Freq: Two times a day (BID) | INTRAVENOUS | Status: DC
  Administered 2013-01-11 – 2013-01-14 (×6): 400 mg via INTRAVENOUS
  Filled 2013-01-11 (×6): qty 200

## 2013-01-11 MED ORDER — PANTOPRAZOLE SODIUM 40 MG IV SOLR
40.0000 mg | Freq: Two times a day (BID) | INTRAVENOUS | Status: DC
  Administered 2013-01-11 – 2013-01-14 (×7): 40 mg via INTRAVENOUS
  Filled 2013-01-11 (×10): qty 40

## 2013-01-11 MED ORDER — POLYETHYLENE GLYCOL 3350 17 G PO PACK
17.0000 g | PACK | Freq: Every day | ORAL | Status: DC
  Administered 2013-01-11: 17 g via ORAL
  Filled 2013-01-11 (×2): qty 1

## 2013-01-11 MED ORDER — ONDANSETRON HCL 4 MG/2ML IJ SOLN
4.0000 mg | Freq: Three times a day (TID) | INTRAMUSCULAR | Status: DC | PRN
  Administered 2013-01-11 – 2013-01-14 (×5): 4 mg via INTRAVENOUS
  Filled 2013-01-11 (×5): qty 2

## 2013-01-11 MED ORDER — ONDANSETRON HCL 4 MG PO TABS: 4.0000 mg | ORAL_TABLET | Freq: Four times a day (QID) | ORAL | Status: DC | PRN

## 2013-01-11 MED ORDER — CIPROFLOXACIN HCL 500 MG PO TABS
500.0000 mg | ORAL_TABLET | Freq: Two times a day (BID) | ORAL | Status: DC
  Filled 2013-01-11 (×3): qty 1

## 2013-01-11 MED ORDER — ONDANSETRON 8 MG PO TBDP: 8.0000 mg | ORAL_TABLET | Freq: Four times a day (QID) | ORAL | Status: DC | PRN

## 2013-01-11 MED ORDER — ONDANSETRON 8 MG/NS 50 ML IVPB
8.0000 mg | Freq: Four times a day (QID) | INTRAVENOUS | Status: DC | PRN
  Filled 2013-01-11: qty 8

## 2013-01-11 NOTE — Progress Notes (Signed)
Patient unable to urinate ,bladder scan shows 274 cc.Patient showing some disorientation   -saying "I,m at my house", attempted to get out of bed x 1 unattended-saying "I  just wanted to get up ". Joanna Salas notified.

## 2013-01-11 NOTE — Progress Notes (Signed)
ANTICOAGULATION CONSULT NOTE   Pharmacy Consult for warfarin Indication: atrial fibrillation  Allergies  Allergen Reactions  . Pneumococcal Vaccines   . Atorvastatin Other (See Comments)    Severe Leg Cramps  . Morphine Sulfate Nausea Only  . Penicillins     REACTION: unspecified  . Prednisone   . Sulfonamide Derivatives Nausea Only    Patient Measurements: Height: 5' 6.5" (168.9 cm) Weight: 160 lb (72.576 kg) IBW/kg (Calculated) : 60.45   Vital Signs: Temp: 98.9 Salas (37.2 C) (05/31 0620) Temp src: Oral (05/31 0620) BP: 112/79 mmHg (05/31 0620) Pulse Rate: 98 (05/31 0620)  Labs:  Recent Labs  01/08/13 1034 01/08/13 1042 01/09/13 0450 01/10/13 0420 01/11/13 0517  HGB 12.8 13.3 11.1* 10.8* 11.0*  HCT 39.2 39.0 35.5* 33.4* 35.0*  PLT 109*  --  88* 93* 109*  LABPROT 21.4*  --  21.9* 28.8* 32.4*  INR 1.94*  --  2.00* 2.90* 3.40*  CREATININE  --  0.80 1.55* 1.21*  --   CKTOTAL  --   --  Joanna  --   --     Estimated Creatinine Clearance: 35 ml/min (by C-G formula based on Cr of 1.21).   Medical History: Past Medical History  Diagnosis Date  . Personal history of colonic polyps   . Atrial fibrillation   . Allergic rhinitis, cause unspecified   . Unspecified asthma(493.90)   . Personal history of other diseases of digestive system   . Personal history of urinary calculi   . Osteoporosis, unspecified   . DJD (degenerative joint disease)   . HTN (hypertension)   . Hyperlipidemia   . Anxiety state, unspecified     Medications:  Prescriptions prior to admission  Medication Sig Dispense Refill  . acetaminophen (TYLENOL) 500 MG tablet Take 1,000 mg by mouth every 6 (six) hours as needed for pain.      Marland Kitchen albuterol (PROAIR HFA) 108 (90 BASE) MCG/ACT inhaler Inhale 2 puffs into the lungs every 4 (four) hours as needed for wheezing or shortness of breath.  8.5 g  prn  . ALPRAZolam (XANAX) 0.25 MG tablet Take 0.25 mg by mouth at bedtime as needed for sleep.      .  cetirizine (ZYRTEC) 10 MG tablet Take 10 mg by mouth at bedtime.      . meclizine (ANTIVERT) 25 MG tablet Take 25-50 mg by mouth 3 (three) times daily as needed for dizziness.      . metoprolol (LOPRESSOR) 50 MG tablet Take 75 mg by mouth 2 (two) times daily.       . ondansetron (ZOFRAN) 4 MG tablet Take 4 mg by mouth every 6 (six) hours as needed for nausea.      . promethazine (PHENERGAN) 12.5 MG tablet Take 12.5-25 mg by mouth at bedtime as needed for nausea (dizziness).      . raloxifene (EVISTA) 60 MG tablet Take 60 mg by mouth daily.        . valsartan (DIOVAN) 320 MG tablet Take 320 mg by mouth daily.        . verapamil (VERELAN PM) 240 MG 24 hr capsule Take 240 mg by mouth daily.        Marland Kitchen warfarin (COUMADIN) 2.5 MG tablet Take 1.25-2.5 mg by mouth daily. 0.5 tab on Sun, Tues, and Thurs; 1 tab all other days.       Scheduled:  . docusate sodium  100 mg Oral BID  . loratadine  10 mg Oral Daily  . metoprolol  75 mg Oral BID  . polyethylene glycol  17 g Oral Daily  . raloxifene  60 mg Oral Daily  . sodium chloride  500 mL Intravenous Once  . verapamil  240 mg Oral Daily  . Warfarin - Pharmacist Dosing Inpatient   Does not apply q1800    Assessment: 79 y/o Salas on chronic warfarin for atrial fibrillation admitted with pubi rami fractures after a fall.  No plans for surgery so warfarin resumed on admission. Warfarin dosage PTA was 1.25 mg Sun, Tues, Thurs; 2.5 mg Mon, Wed, Fri, Sat.      INR supratherapeutic and rising (3.4)  Plt low/stable, Hgb okay, no bleeding reported   No bleeding reported   Goal of Therapy:  INR 2-3    Plan:  1.) No warfarin today - would NOT discharge to SNF on home regimen 2.) Daily PT/INR while inpatient, recommend this also at SNF to determine daily warfarin doses  Rollene Fare PharmD, BCPS Pager #: [phone] 10:16 AM 01/11/2013

## 2013-01-11 NOTE — Progress Notes (Signed)
Per MD, Pt ready for d/c.  Notified RN, Pt, family and facility.  Facility has all necessary information from [facility].  Facility ready to receive Pt.  Arranged for transportation.  Providence Crosby, LCSWA Clinical Social Work [phone]

## 2013-01-12 ENCOUNTER — Inpatient Hospital Stay (HOSPITAL_COMMUNITY): Payer: Medicare Other

## 2013-01-12 ENCOUNTER — Encounter (HOSPITAL_COMMUNITY): Payer: Self-pay | Admitting: Radiology

## 2013-01-12 DIAGNOSIS — I350 Nonrheumatic aortic (valve) stenosis: Secondary | ICD-10-CM

## 2013-01-12 HISTORY — DX: Nonrheumatic aortic (valve) stenosis: I35.0

## 2013-01-12 LAB — CBC
Hemoglobin: 11.1 g/dL — ABNORMAL LOW (ref 12.0–15.0)
MCH: 29.8 pg (ref 26.0–34.0)
MCHC: 33 g/dL (ref 30.0–36.0)
Platelets: 134 10*3/uL — ABNORMAL LOW (ref 150–400)
RBC: 3.72 MIL/uL — ABNORMAL LOW (ref 3.87–5.11)

## 2013-01-12 LAB — BASIC METABOLIC PANEL
CO2: 24 mEq/L (ref 19–32)
Calcium: 8.2 mg/dL — ABNORMAL LOW (ref 8.4–10.5)
Potassium: 4.9 mEq/L (ref 3.5–5.1)
Sodium: 134 mEq/L — ABNORMAL LOW (ref 135–145)

## 2013-01-12 LAB — VITAMIN D 1,25 DIHYDROXY: Vitamin D 1, 25 (OH)2 Total: 46 pg/mL (ref 18–72)

## 2013-01-12 LAB — PROTIME-INR: INR: 4.05 — ABNORMAL HIGH (ref 0.00–1.49)

## 2013-01-12 MED ORDER — SODIUM CHLORIDE 0.9 % IV SOLN: INTRAVENOUS | Status: DC

## 2013-01-12 MED ORDER — ALPRAZOLAM 0.25 MG PO TABS
0.2500 mg | ORAL_TABLET | Freq: Three times a day (TID) | ORAL | Status: DC | PRN
  Administered 2013-01-12 – 2013-01-14 (×5): 0.25 mg via ORAL
  Filled 2013-01-12 (×5): qty 1

## 2013-01-12 MED ORDER — SODIUM CHLORIDE 0.9 % IV SOLN
INTRAVENOUS | Status: AC
  Administered 2013-01-12: 19:00:00 via INTRAVENOUS

## 2013-01-12 MED ORDER — SODIUM CHLORIDE 0.9 % IV BOLUS (SEPSIS)
500.0000 mL | Freq: Once | INTRAVENOUS | Status: AC
  Administered 2013-01-12: 500 mL via INTRAVENOUS

## 2013-01-12 MED ORDER — CALCIUM CARBONATE ANTACID 500 MG PO CHEW
1.0000 | CHEWABLE_TABLET | Freq: Once | ORAL | Status: AC
  Administered 2013-01-13: 200 mg via ORAL
  Filled 2013-01-12: qty 1

## 2013-01-12 NOTE — Progress Notes (Signed)
Per MD, Pt was not able to d/c yesterday.  RN notified facility.  Per MD, Pt not ready for d/c.  CSW notified Jasmine December at [facility].  [facility] to f/u.  Providence Crosby, LCSWA Clinical Social Work [phone]

## 2013-01-12 NOTE — Progress Notes (Signed)
ANTICOAGULATION CONSULT NOTE   Pharmacy Consult for warfarin Indication: atrial fibrillation  Allergies  Allergen Reactions  . Pneumococcal Vaccines   . Atorvastatin Other (See Comments)    Severe Leg Cramps  . Morphine Sulfate Nausea Only  . Penicillins     REACTION: unspecified  . Prednisone   . Sulfonamide Derivatives Nausea Only    Patient Measurements: Height: 5' 6.5" (168.9 cm) Weight: 160 lb (72.576 kg) IBW/kg (Calculated) : 60.45   Vital Signs: Temp: 98.5 F (36.9 C) (06/01 0500) Temp src: Oral (06/01 0500) BP: 138/90 mmHg (06/01 0500) Pulse Rate: 107 (06/01 0500)  Labs:  Recent Labs  01/10/13 0420 01/11/13 0517 01/12/13 0709  HGB 10.8* 11.0* 11.1*  HCT 33.4* 35.0* 33.6*  PLT 93* 109* 134*  LABPROT 28.8* 32.4* 36.9*  INR 2.90* 3.40* 4.05*  CREATININE 1.21* 1.29* 1.34*    Estimated Creatinine Clearance: 31.6 ml/min (by C-G formula based on Cr of 1.34).   Medications:  Scheduled:  . ciprofloxacin  400 mg Intravenous Q12H  . docusate sodium  100 mg Oral BID  . metoprolol  75 mg Oral BID  . pantoprazole (PROTONIX) IV  40 mg Intravenous Q12H  . raloxifene  60 mg Oral Daily  . verapamil  240 mg Oral Daily  . Warfarin - Pharmacist Dosing Inpatient   Does not apply q1800    Assessment: [name] on chronic warfarin for atrial fibrillation admitted with pubi rami fractures after a fall.  No plans for surgery so warfarin resumed on admission. Warfarin dosage PTA was 1.25 mg Sun, Tues, Thurs; 2.5 mg Mon, Wed, Fri, Sat.      INR supratherapeutic and rising (4.05)  Plt low but improved, Hgb stable/ok, no bleeding reported   Noted drug-drug interaction with Cipro (prolongs INR)  Goal of Therapy:  INR 2-3    Plan:  1.) No warfarin today - would NOT discharge to SNF on home regimen 2.) Daily PT/INR while inpatient, recommend this also at SNF to determine daily warfarin doses and close f/u with antibiotic drug interaction.  Lynann Beaver PharmD,  BCPS Pager [phone] 01/12/2013 8:45 AM

## 2013-01-12 NOTE — Progress Notes (Signed)
[facility] PROGRESS NOTE  Assessment/Plan: AKI (acute kidney injury): - worsening creatinine due to Nausea and vomiting, bolus NS then start IV fluids. - check b-met in am. Continue zofran - Will d/c ARB, Avoid NSAID's - Strict I and O's, foley.   Nausea and vomiting due to UTI: - cipro, cont zofran, phenergan. - pt experience a fall on coumadin check CT head.  Closed fracture of multiple pubic rami: - Mechanical. - Ortho recommended gradual progressive ambulation and bladder scan.  A fib: - rate control. - supra Thx INR.   HTN: - control.   Code Status: Full  Family Communication: Pt at bedside  Disposition Plan: SNF   Consultants:  ortho  Procedures:  none  Antibiotics:  none (indicate start date, and stop date if known)  HPI/Subjective: patient is sleepy. Tried to eat but makes her nauseated.  Objective: Filed Vitals:   01/12/13 0000 01/12/13 0400 01/12/13 0500 01/12/13 0754  BP:   138/90   Pulse:   107   Temp:   98.5 F (36.9 C)   TempSrc:   Oral   Resp: 14 16 16 20   Height:      Weight:      SpO2: 99% 99% 97%     Intake/Output Summary (Last 24 hours) at 01/12/13 0817 Last data filed at 01/12/13 0651  Gross per 24 hour  Intake      0 ml  Output    350 ml  Net   -350 ml   Filed Weights   01/08/13 1700  Weight: 72.576 kg (160 lb)    Exam:  General: Alert, awake, oriented x3, in no acute distress.  HEENT: No bruits, no goiter.  Heart: Regular rate and rhythm, without murmurs, rubs, gallops.  Lungs: Good air movement, clear to auscultation. Abdomen: Soft, nontender, nondistended, positive bowel sounds.  Neuro: Grossly intact, nonfocal.   Data Reviewed: Basic Metabolic Panel:  Recent Labs Lab 01/08/13 1042 01/09/13 0450 01/10/13 0420 01/11/13 0517 01/12/13 0709  NA 141 138 134* 132* 134*  K 3.5 4.2 4.4 4.4 4.9  CL 106 103 99 98 101  CO2  --  28 30 22 24   GLUCOSE 145* 118* 125* 119* 146*  BUN 11 18 22  25* 32*   CREATININE 0.80 1.55* 1.21* 1.29* 1.34*  CALCIUM  --  8.5 8.4 8.7 8.2*   Liver Function Tests: No results found for this basename: AST, ALT, ALKPHOS, BILITOT, PROT, ALBUMIN,  in the last 168 hours No results found for this basename: LIPASE, AMYLASE,  in the last 168 hours No results found for this basename: AMMONIA,  in the last 168 hours CBC:  Recent Labs Lab 01/08/13 1034 01/08/13 1042 01/09/13 0450 01/10/13 0420 01/11/13 0517  WBC 6.5  --  7.7 8.6 9.5  HGB 12.8 13.3 11.1* 10.8* 11.0*  HCT 39.2 39.0 35.5* 33.4* 35.0*  MCV 91.2  --  90.6 93.0 90.9  PLT 109*  --  88* 93* 109*   Cardiac Enzymes:  Recent Labs Lab 01/09/13 0450  CKTOTAL 56   BNP (last 3 results) No results found for this basename: PROBNP,  in the last 8760 hours CBG: No results found for this basename: GLUCAP,  in the last 168 hours  Recent Results (from the past 240 hour(s))  URINE CULTURE     Status: None   Collection Time    01/08/13  3:03 PM      Result Value Range Status   Specimen Description URINE, CATHETERIZED   Final  Special Requests NONE   Final   Culture  Setup Time 01/09/2013 02:00   Final   Colony Count >=100,000 COLONIES/ML   Final   Culture [facility] MORGANII   Final   Report Status 01/11/2013 FINAL   Final   Organism ID, Bacteria MORGANELLA MORGANII   Final     Studies: Dg Chest 1 View  01/11/2013   *RADIOLOGY REPORT*  Clinical Data: Shortness of breath.  Weakness.  Hypertension.  CHEST - 1 VIEW  Comparison: 01/08/2013  Findings: Patient rotated to the right.  Moderate cardiomegaly with tortuous thoracic aorta.  Atherosclerosis in the transverse aorta. Mild right hemidiaphragm elevation.  No right-sided pleural effusion.  Cannot exclude trace left pleural fluid or thickening. No pneumothorax.  Chronic interstitial thickening, felt to be similar given differences in technique with decreased inspiratory effort today.  There is increased left lower lobe density.  IMPRESSION:  Cardiomegaly and chronic interstitial thickening.  Subtle increased density at the left lung base.  Atelectasis versus early infection.  Consider short-term radiographic follow-up.  Possible trace left pleural fluid versus thickening.   Original Report Authenticated By: Jeronimo Greaves, M.D.   Dg Shoulder Right Port  01/11/2013   *RADIOLOGY REPORT*  Clinical Data: Fall, right shoulder pain  PORTABLE RIGHT SHOULDER - 2+ VIEW  Comparison: None.  Findings: No fracture or dislocation is seen.  Mild degenerative changes of the acromioclavicular joint.  The visualized right lung is essentially clear.  IMPRESSION: No fracture or dislocation is seen.   Original Report Authenticated By: Charline Bills, M.D.    Scheduled Meds: . ciprofloxacin  400 mg Intravenous Q12H  . docusate sodium  100 mg Oral BID  . loratadine  10 mg Oral Daily  . metoprolol  75 mg Oral BID  . pantoprazole (PROTONIX) IV  40 mg Intravenous Q12H  . polyethylene glycol  17 g Oral Daily  . raloxifene  60 mg Oral Daily  . verapamil  240 mg Oral Daily  . Warfarin - Pharmacist Dosing Inpatient   Does not apply q1800   Continuous Infusions: . sodium chloride       Marinda Elk  [facility] Pager [phone]. If 8PM-8AM, please contact night-coverage at [email or web address], password [email or web address] 01/12/2013, 8:17 AM  LOS: 4 days

## 2013-01-13 LAB — PROTIME-INR
INR: 4.24 — ABNORMAL HIGH (ref 0.00–1.49)
Prothrombin Time: 38.2 seconds — ABNORMAL HIGH (ref 11.6–15.2)

## 2013-01-13 LAB — BASIC METABOLIC PANEL
BUN: 34 mg/dL — ABNORMAL HIGH (ref 6–23)
Chloride: 103 mEq/L (ref 96–112)
Creatinine, Ser: 1.13 mg/dL — ABNORMAL HIGH (ref 0.50–1.10)
GFR calc Af Amer: 50 mL/min — ABNORMAL LOW (ref >90)
GFR calc non Af Amer: 43 mL/min — ABNORMAL LOW (ref >90)

## 2013-01-13 LAB — TROPONIN I: Troponin I: <0.3 ng/mL (ref <0.30)

## 2013-01-13 MED ORDER — SODIUM CHLORIDE 0.9 % IV SOLN
INTRAVENOUS | Status: DC
  Administered 2013-01-13 – 2013-01-15 (×4): via INTRAVENOUS

## 2013-01-13 MED ORDER — CALCIUM CARBONATE ANTACID 500 MG PO CHEW
1.0000 | CHEWABLE_TABLET | ORAL | Status: DC | PRN
  Administered 2013-01-13 (×2): 200 mg via ORAL
  Filled 2013-01-13 (×3): qty 1

## 2013-01-13 MED ORDER — ALUM & MAG HYDROXIDE-SIMETH 200-200-20 MG/5ML PO SUSP: 15.0000 mL | ORAL | Status: DC | PRN

## 2013-01-13 NOTE — Progress Notes (Signed)
Pt asleep and family requesting not to wake patient at this time.  Patient resting quietly. Will offer when patient wakes.

## 2013-01-13 NOTE — Progress Notes (Signed)
Pt complaining of heartburn. Notified Lenny Pastel, NP. Order given for tums. Notified patient of the plan of care, pt verbalizes understanding. Call light within reach. Family at bedside.

## 2013-01-13 NOTE — Progress Notes (Signed)
[facility] PROGRESS NOTE  Assessment/Plan: AKI (acute kidney injury): - improved creatinine, NS  IV. - Will d/c ARB, Avoid NSAID's - Strict I and O's, foley. - hungry.  Nausea and vomiting due to UTI: - cipro, cont zofran, phenergan. - pt experience a fall on coumadin check CT head.  Closed fracture of multiple pubic rami: - Mechanical. - Ortho recommended gradual progressive ambulation and bladder scan.  A fib: - rate control. - supra Thx INR.   HTN: - control.   Code Status: Full  Family Communication: Pt at bedside  Disposition Plan: SNF   Consultants:  ortho  Procedures:  none  Antibiotics:  none (indicate start date, and stop date if known)  HPI/Subjective: Dry hiving. Tolerate it clear.  Objective: Filed Vitals:   01/12/13 2157 01/13/13 0000 01/13/13 0340 01/13/13 0528  BP: 129/84   143/91  Pulse: 59   97  Temp: 98.4 F (36.9 C)   98.3 F (36.8 C)  TempSrc: Oral   Oral  Resp: 18 16 16 16   Height:      Weight:      SpO2: 99% 98% 99% 96%    Intake/Output Summary (Last 24 hours) at 01/13/13 0802 Last data filed at 01/13/13 0600  Gross per 24 hour  Intake 3456.25 ml  Output   1000 ml  Net 2456.25 ml   Filed Weights   01/08/13 1700  Weight: 72.576 kg (160 lb)    Exam:  General: Alert, awake, oriented x3, in no acute distress.  HEENT: No bruits, no goiter.  Heart: Regular rate and rhythm, without murmurs, rubs, gallops.  Lungs: Good air movement, clear to auscultation. Abdomen: Soft, nontender, nondistended, positive bowel sounds.  Neuro: Grossly intact, nonfocal.   Data Reviewed: Basic Metabolic Panel:  Recent Labs Lab 01/09/13 0450 01/10/13 0420 01/11/13 0517 01/12/13 0709 01/13/13 0422  NA 138 134* 132* 134* 134*  K 4.2 4.4 4.4 4.9 4.8  CL 103 99 98 101 103  CO2 28 30 22 24 21   GLUCOSE 118* 125* 119* 146* 109*  BUN 18 22 25* 32* 34*  CREATININE 1.55* 1.21* 1.29* 1.34* 1.13*  CALCIUM 8.5 8.4 8.7 8.2* 8.3*    Liver Function Tests: No results found for this basename: AST, ALT, ALKPHOS, BILITOT, PROT, ALBUMIN,  in the last 168 hours No results found for this basename: LIPASE, AMYLASE,  in the last 168 hours No results found for this basename: AMMONIA,  in the last 168 hours CBC:  Recent Labs Lab 01/08/13 1034 01/08/13 1042 01/09/13 0450 01/10/13 0420 01/11/13 0517 01/12/13 0709  WBC 6.5  --  7.7 8.6 9.5 7.1  HGB 12.8 13.3 11.1* 10.8* 11.0* 11.1*  HCT 39.2 39.0 35.5* 33.4* 35.0* 33.6*  MCV 91.2  --  90.6 93.0 90.9 90.3  PLT 109*  --  88* 93* 109* 134*   Cardiac Enzymes:  Recent Labs Lab 01/09/13 0450  CKTOTAL 56   BNP (last 3 results) No results found for this basename: PROBNP,  in the last 8760 hours CBG: No results found for this basename: GLUCAP,  in the last 168 hours  Recent Results (from the past 240 hour(s))  URINE CULTURE     Status: None   Collection Time    01/08/13  3:03 PM      Result Value Range Status   Specimen Description URINE, CATHETERIZED   Final   Special Requests NONE   Final   Culture  Setup Time 01/09/2013 02:00   Final  Colony Count >=100,000 COLONIES/ML   Final   Culture [facility] MORGANII   Final   Report Status 01/11/2013 FINAL   Final   Organism ID, Bacteria MORGANELLA MORGANII   Final  URINE CULTURE     Status: None   Collection Time    01/11/13 11:17 AM      Result Value Range Status   Specimen Description URINE, RANDOM   Final   Special Requests NONE   Final   Culture  Setup Time 01/11/2013 17:49   Final   Colony Count >=100,000 COLONIES/ML   Final   Culture ESCHERICHIA COLI   Final   Report Status PENDING   Incomplete     Studies: Dg Chest 1 View  01/11/2013   *RADIOLOGY REPORT*  Clinical Data: Shortness of breath.  Weakness.  Hypertension.  CHEST - 1 VIEW  Comparison: 01/08/2013  Findings: Patient rotated to the right.  Moderate cardiomegaly with tortuous thoracic aorta.  Atherosclerosis in the transverse aorta. Mild right  hemidiaphragm elevation.  No right-sided pleural effusion.  Cannot exclude trace left pleural fluid or thickening. No pneumothorax.  Chronic interstitial thickening, felt to be similar given differences in technique with decreased inspiratory effort today.  There is increased left lower lobe density.  IMPRESSION: Cardiomegaly and chronic interstitial thickening.  Subtle increased density at the left lung base.  Atelectasis versus early infection.  Consider short-term radiographic follow-up.  Possible trace left pleural fluid versus thickening.   Original Report Authenticated By: Jeronimo Greaves, M.D.   Ct Head Wo Contrast  01/12/2013   *RADIOLOGY REPORT*  Clinical Data: Lethargy.  The patient is disoriented.  CT HEAD WITHOUT CONTRAST  Technique:  Contiguous axial images were obtained from the base of the skull through the vertex without contrast.  Comparison: CT head without contrast 01/08/2013 and CT head without contrast 05/29/2008.  Findings: Extensive periventricular and subcortical white matter hypoattenuation is evident bilaterally.  No acute cortical infarct, hemorrhage, or mass lesion is present.  The ventricles are normal size.  No significant extra-axial fluid collection is evident.  Chronic mucosal thickening is evident in the maxillary sinuses bilaterally, right greater than left.  The paranasal sinuses and mastoid air cells are clear.  Atherosclerotic calcifications are evident within the cavernous carotid arteries bilaterally.  IMPRESSION:  1.  Extensive periventricular and subcortical white matter hypoattenuation bilaterally.  This likely reflects the sequelae of chronic microvascular ischemia. 2.  No acute intracranial abnormality or significant interval change.   Original Report Authenticated By: Marin Roberts, M.D.   Dg Shoulder Right Port  01/11/2013   *RADIOLOGY REPORT*  Clinical Data: Fall, right shoulder pain  PORTABLE RIGHT SHOULDER - 2+ VIEW  Comparison: None.  Findings: No fracture or  dislocation is seen.  Mild degenerative changes of the acromioclavicular joint.  The visualized right lung is essentially clear.  IMPRESSION: No fracture or dislocation is seen.   Original Report Authenticated By: Charline Bills, M.D.    Scheduled Meds: . ciprofloxacin  400 mg Intravenous Q12H  . docusate sodium  100 mg Oral BID  . metoprolol  75 mg Oral BID  . pantoprazole (PROTONIX) IV  40 mg Intravenous Q12H  . raloxifene  60 mg Oral Daily  . verapamil  240 mg Oral Daily  . Warfarin - Pharmacist Dosing Inpatient   Does not apply q1800   Continuous Infusions: . sodium chloride 125 mL/hr at 01/12/13 1858     Marinda Elk  [facility] Pager [phone]. If 8PM-8AM, please contact night-coverage at [email or web address],  password [email or web address] 01/13/2013, 8:02 AM  LOS: 5 days

## 2013-01-13 NOTE — Progress Notes (Signed)
Physical Therapy Treatment Patient Details Name: Joanna Salas MRN: [id number] DOB: 1924-11-14 Today's Date: 01/13/2013 Time: 4098-1191 PT Time Calculation (min): 26 min  PT Assessment / Plan / Recommendation Comments on Treatment Session  Pt progressing poorly concidering she was Indep prior.  MAX assist with bed mobility and near fall during 1/4 turn just to the chair.  Pt stated her R leg gave way and overall she feels bad.      Follow Up Recommendations  SNF     Does the patient have the potential to tolerate intense rehabilitation     Barriers to Discharge        Equipment Recommendations       Recommendations for Other Services    Frequency Min 4X/week   Plan Discharge plan remains appropriate;Frequency remains appropriate    Precautions / Restrictions Precautions Precautions: Fall Restrictions Weight Bearing Restrictions: No RLE Weight Bearing: Weight bearing as tolerated   Pertinent Vitals/Pain C/o MAX weakness and overall not feeling well    Mobility  Bed Mobility Bed Mobility: Supine to Sit;Sitting - Scoot to Edge of Bed Supine to Sit: 2: Max assist Sitting - Scoot to [place] of Bed: 2: Max assist Details for Bed Mobility Assistance: Max assist to swival hips around and scoot to edge of bed Transfers Transfers: Sit to Stand;Stand to Sit Sit to Stand: 3: Mod assist;2: Max assist;From bed Stand to Sit: 3: Mod assist;To chair/3-in-1 Details for Transfer Assistance: 50% VC's on proper hand plcement and upright posture as R LE buckled due to pain, near fall.  Pt stated it felt like her leg gave way.  Only able to take a few small steps from the bed to the chair.  Max c/o weakness and overall not feeling well. Ambulation/Gait Ambulation/Gait Assistance Details: Unable to attempt with poor tranfer ability     PT Goals                                                            progressing    Visit Information  Last PT Received On: 01/13/13 Assistance Needed: +2    Subjective Data  Subjective: I feel bad   Cognition    good   Balance   poor  End of Session PT - End of Session Equipment Utilized During Treatment: Gait belt;Oxygen Activity Tolerance: Patient limited by fatigue Patient left: in chair;with call bell/phone within reach;with family/visitor present   Felecia Shelling  PTA [facility]  Acute  Rehab Pager      [phone]

## 2013-01-13 NOTE — Progress Notes (Signed)
ANTICOAGULATION CONSULT NOTE   Pharmacy Consult for warfarin Indication: atrial fibrillation  Allergies  Allergen Reactions  . Pneumococcal Vaccines   . Atorvastatin Other (See Comments)    Severe Leg Cramps  . Morphine Sulfate Nausea Only  . Penicillins     REACTION: unspecified  . Prednisone   . Sulfonamide Derivatives Nausea Only    Patient Measurements: Height: 5' 6.5" (168.9 cm) Weight: 160 lb (72.576 kg) IBW/kg (Calculated) : 60.45   Vital Signs: Temp: 98.3 F (36.8 C) (06/02 0528) Temp src: Oral (06/02 0528) BP: 143/91 mmHg (06/02 0528) Pulse Rate: 97 (06/02 0528)  Labs:  Recent Labs  01/11/13 0517 01/12/13 0709 01/13/13 0422  HGB 11.0* 11.1*  --   HCT 35.0* 33.6*  --   PLT 109* 134*  --   LABPROT 32.4* 36.9* 38.2*  INR 3.40* 4.05* 4.24*  CREATININE 1.29* 1.34* 1.13*    Estimated Creatinine Clearance: 37.5 ml/min (by C-G formula based on Cr of 1.13).   Medications:  Scheduled:  . ciprofloxacin  400 mg Intravenous Q12H  . docusate sodium  100 mg Oral BID  . metoprolol  75 mg Oral BID  . pantoprazole (PROTONIX) IV  40 mg Intravenous Q12H  . raloxifene  60 mg Oral Daily  . verapamil  240 mg Oral Daily  . Warfarin - Pharmacist Dosing Inpatient   Does not apply q1800   Warfarin doses administered 5/28 - 6/1:  2.5, 1.25, 0.5, 0, 0 mg  Diet: Clear liquid  Assessment: [name] on chronic warfarin for atrial fibrillation admitted with pubi rami fractures after a fall.  No plans for surgery so warfarin resumed on admission. Warfarin dosage PTA was 1.25 mg Sun, Tues, Thurs; 2.5 mg Mon, Wed, Fri, Sat.      INR supratherapeutic and rising (4.24) despite withholding warfarin x 2 days.  Likely multifactorial etiology:  Minimal dietary vitamin K intake in setting of nausea/vomiting  Drug interaction with Cipro (prolongs INR)  On CBC yesterday pltc was improving, Hgb stable.  No overt bleeding reported in chart notes.  Goal of Therapy:  INR 2-3    Plan:  1. No warfarin today - would NOT discharge to SNF on home regimen. 2. Daily PT/INR while inpatient, recommend this also at SNF to determine daily warfarin doses and close f/u with antibiotic drug interaction.  Elie Goody, PharmD, BCPS Pager: [phone] 01/13/2013  8:04 AM

## 2013-01-13 NOTE — Progress Notes (Signed)
Pt awake and complaining of heartburn. Patient given Tums at 0045 without relief. Notified Lenny Pastel, NP. Order given to give another dose of Tums. Tums given per order and zofran given. Family at bedside.  Call light within reach.

## 2013-01-14 LAB — URINE CULTURE

## 2013-01-14 LAB — PROTIME-INR
INR: 4.33 — ABNORMAL HIGH (ref 0.00–1.49)
Prothrombin Time: 38.8 seconds — ABNORMAL HIGH (ref 11.6–15.2)

## 2013-01-14 MED ORDER — LIP MEDEX EX OINT
TOPICAL_OINTMENT | CUTANEOUS | Status: AC
  Administered 2013-01-14: 02:00:00
  Filled 2013-01-14: qty 7

## 2013-01-14 MED ORDER — CIPROFLOXACIN HCL 500 MG PO TABS
500.0000 mg | ORAL_TABLET | Freq: Two times a day (BID) | ORAL | Status: DC
  Administered 2013-01-14 – 2013-01-16 (×5): 500 mg via ORAL
  Filled 2013-01-14 (×7): qty 1

## 2013-01-14 MED ORDER — PANTOPRAZOLE SODIUM 40 MG PO TBEC: 40.0000 mg | DELAYED_RELEASE_TABLET | Freq: Every day | ORAL | Status: DC

## 2013-01-14 MED ORDER — ALUM & MAG HYDROXIDE-SIMETH 200-200-20 MG/5ML PO SUSP: 15.0000 mL | ORAL | Status: DC | PRN

## 2013-01-14 MED ORDER — ALPRAZOLAM 0.25 MG PO TABS: 0.2500 mg | ORAL_TABLET | Freq: Every evening | ORAL | Status: DC | PRN

## 2013-01-14 MED ORDER — CIPROFLOXACIN HCL 500 MG PO TABS: 500.0000 mg | ORAL_TABLET | Freq: Two times a day (BID) | ORAL | Status: AC

## 2013-01-14 NOTE — Progress Notes (Signed)
Subjective: Awake, alert and oriented x 4. At bedside commode trying to void post catheter d/c today. UTI with E. Coli. Anemia likely due to acute blood loss from pelvis  Fractures. Creatinine elevation may be secondary to dehydration. Able to transfer bed to bedside commode. Good candidate for SNF as it will probably requires Weeks before she is more independent.   Objective: Vital signs in last 24 hours: Temp:  [97.4 F (36.3 C)-97.8 F (36.6 C)] 97.4 F (36.3 C) (06/03 1400) Pulse Rate:  [68-103] 68 (06/03 1400) Resp:  [16-18] 18 (06/03 1400) BP: (107-145)/(70-90) 107/70 mmHg (06/03 1400) SpO2:  [95 %-99 %] 99 % (06/03 1400)  Intake/Output from previous day: 06/02 0701 - 06/03 0700 In: 2856.7 [P.O.:240; I.V.:2216.7; IV Piggyback:400] Out: 1025 [Urine:1025] Intake/Output this shift: Total I/O In: 445 [I.V.:445] Out: 300 [Urine:300]   Recent Labs  01/12/13 0709  HGB 11.1*    Recent Labs  01/12/13 0709  WBC 7.1  RBC 3.72*  HCT 33.6*  PLT 134*    Recent Labs  01/12/13 0709 01/13/13 0422  NA 134* 134*  K 4.9 4.8  CL 101 103  CO2 24 21  BUN 32* 34*  CREATININE 1.34* 1.13*  GLUCOSE 146* 109*  CALCIUM 8.2* 8.3*    Recent Labs  01/13/13 0422 01/14/13 0206  INR 4.24* 4.33*    Neurologically intact ABD soft Intact pulses distally Dorsiflexion/Plantar flexion intact  Assessment/Plan: Multiple right pubic rami fractures. Anemia Renal insuff.  Plan: Skilled nursing home. Her pelvis fractures are extraarticular and her pain will improve as healing occurs Urinary retention due to pelvis fractures hopefully will be able to void post foley d/c.  Stable orthopaedic pelvis fractures no surgery indicated. Slow rehab thus far.    Stratton Villwock E 01/14/2013, 4:58 PM

## 2013-01-14 NOTE — Progress Notes (Signed)
Pt bladder scan showed 182 ml.  Pt has been up to bedside commode x 2 and did not void but had 2 bowel movements.

## 2013-01-14 NOTE — Progress Notes (Signed)
CSW confirmed with [facility] that patient still has a bed available there when ready for discharge - anticipating today if patient is able to void, per RN.   Unice Bailey, LCSW [facility] Clinical Social Worker cell #: [phone]

## 2013-01-14 NOTE — Progress Notes (Signed)
ANTICOAGULATION CONSULT NOTE   Pharmacy Consult for warfarin Indication: atrial fibrillation  Allergies  Allergen Reactions  . Pneumococcal Vaccines   . Atorvastatin Other (See Comments)    Severe Leg Cramps  . Morphine Sulfate Nausea Only  . Penicillins     REACTION: unspecified  . Prednisone   . Sulfonamide Derivatives Nausea Only    Patient Measurements: Height: 5' 6.5" (168.9 cm) Weight: 160 lb (72.576 kg) IBW/kg (Calculated) : 60.45   Vital Signs: Temp: 97.8 F (36.6 C) (06/03 0507) Temp src: Oral (06/03 0507) BP: 145/90 mmHg (06/03 0507) Pulse Rate: 103 (06/03 0507)  Labs:  Recent Labs  01/12/13 0709 01/13/13 0422 01/13/13 1433 01/13/13 2018 01/14/13 0206  HGB 11.1*  --   --   --   --   HCT 33.6*  --   --   --   --   PLT 134*  --   --   --   --   LABPROT 36.9* 38.2*  --   --  38.8*  INR 4.05* 4.24*  --   --  4.33*  CREATININE 1.34* 1.13*  --   --   --   TROPONINI  --   --  <0.30 <0.30 <0.30    Estimated Creatinine Clearance: 37.5 ml/min (by C-G formula based on Cr of 1.13).   Medications:  Scheduled:  . ciprofloxacin  400 mg Intravenous Q12H  . docusate sodium  100 mg Oral BID  . metoprolol  75 mg Oral BID  . pantoprazole (PROTONIX) IV  40 mg Intravenous Q12H  . raloxifene  60 mg Oral Daily  . verapamil  240 mg Oral Daily  . Warfarin - Pharmacist Dosing Inpatient   Does not apply q1800    Assessment: [name] on chronic warfarin for atrial fibrillation admitted with pubi rami fractures after a fall.  No plans for surgery so warfarin resumed on admission. Warfarin dosage PTA was 1.25 mg Sun, Tues, Thurs; 2.5 mg Mon, Wed, Fri, Sat.      INR supratherapeutic and rising (4.33)  Plt low but improved, Hgb stable/ok, no bleeding reported   Noted drug-drug interaction with Cipro (prolongs INR)   Goal of Therapy:  INR 2-3    Plan:  1.) No warfarin today - would NOT discharge to SNF on home regimen 2.) Daily PT/INR while inpatient, recommend  this also at SNF to determine daily warfarin doses and close f/u with antibiotic drug interaction.  Loralee Pacas, PharmD, BCPS Pager: [phone]  01/14/2013 7:17 AM

## 2013-01-15 DIAGNOSIS — F411 Generalized anxiety disorder: Secondary | ICD-10-CM

## 2013-01-15 DIAGNOSIS — J45909 Unspecified asthma, uncomplicated: Secondary | ICD-10-CM

## 2013-01-15 LAB — PROTIME-INR: Prothrombin Time: 36.5 seconds — ABNORMAL HIGH (ref 11.6–15.2)

## 2013-01-15 MED ORDER — PROMETHAZINE HCL 25 MG PO TABS
12.5000 mg | ORAL_TABLET | Freq: Four times a day (QID) | ORAL | Status: DC | PRN
  Administered 2013-01-15 – 2013-01-16 (×3): 12.5 mg via ORAL
  Filled 2013-01-15 (×3): qty 1

## 2013-01-15 MED ORDER — PANTOPRAZOLE SODIUM 40 MG PO TBEC
40.0000 mg | DELAYED_RELEASE_TABLET | Freq: Every day | ORAL | Status: DC
  Administered 2013-01-15: 40 mg via ORAL
  Filled 2013-01-15 (×3): qty 1

## 2013-01-15 NOTE — Progress Notes (Signed)
ANTICOAGULATION CONSULT NOTE   Pharmacy Consult for warfarin Indication: atrial fibrillation  Allergies  Allergen Reactions  . Pneumococcal Vaccines   . Atorvastatin Other (See Comments)    Severe Leg Cramps  . Morphine Sulfate Nausea Only  . Penicillins     REACTION: unspecified  . Prednisone   . Sulfonamide Derivatives Nausea Only    Patient Measurements: Height: 5' 6.5" (168.9 cm) Weight: 160 lb (72.576 kg) IBW/kg (Calculated) : 60.45   Vital Signs: Temp: 98 F (36.7 C) (06/04 0450) Temp src: Oral (06/04 0450) BP: 157/81 mmHg (06/04 0450) Pulse Rate: 95 (06/04 0450)  Labs:  Recent Labs  01/13/13 0422 01/13/13 1433 01/13/13 2018 01/14/13 0206 01/15/13 0437  LABPROT 38.2*  --   --  38.8* 36.5*  INR 4.24*  --   --  4.33* 3.99*  CREATININE 1.13*  --   --   --   --   TROPONINI  --  <0.30 <0.30 <0.30  --     Estimated Creatinine Clearance: 37.5 ml/min (by C-G formula based on Cr of 1.13).   Medications:  Scheduled:  . ciprofloxacin  500 mg Oral BID  . docusate sodium  100 mg Oral BID  . metoprolol  75 mg Oral BID  . pantoprazole (PROTONIX) IV  40 mg Intravenous Q12H  . raloxifene  60 mg Oral Daily  . verapamil  240 mg Oral Daily  . Warfarin - Pharmacist Dosing Inpatient   Does not apply q1800    Assessment: [name] on chronic warfarin for atrial fibrillation admitted with pubi rami fractures after a fall.  No plans for surgery so warfarin resumed on admission. Warfarin dosage PTA was 1.25 mg Sun, Tues, Thurs; 2.5 mg Mon, Wed, Fri, Sat.      INR improving but still supratherapeutic.  Last CBC was 6/1. No bleeding reported.   Dietary intake poor.  Noted drug-drug interaction with Cipro (prolongs INR) day#5 of ?  Goal of Therapy:  INR 2-3   Plan:  1.) Hold warfarin again today - would NOT discharge to SNF on home regimen. 2.) Daily PT/INR while inpatient, recommend this also at SNF to determine daily warfarin doses and close f/u with antibiotic  drug interaction. 3.) Check CBC in am.  Charolotte Eke, PharmD, pager [phone]. 01/15/2013,8:25 AM.

## 2013-01-15 NOTE — Progress Notes (Signed)
This patient is receiving IV Protonix. Based on criteria approved by the Pharmacy and Therapeutics Committee, this medication is being converted to the equivalent oral dose form. These criteria include:   . The patient is eating (either orally or per tube) and/or has been taking other orally administered medications for at least 24 hours.  . This patient has no evidence of active gastrointestinal bleeding or impaired GI absorption (gastrectomy, short bowel, patient on TNA or NPO).   If you have questions about this conversion, please contact the pharmacy department.  Joanna Salas, [facility] 01/15/2013 8:30 AM

## 2013-01-15 NOTE — Progress Notes (Signed)
Subjective:    Awake, alert and oriented x 4. Pain is mild. Able to pass her water in small amounts, I can't control it well. Bladder scan  Last night. Tolerating po meds and diet. Some SOB, given breathing treatment last evening and O2 pnc restarted due to  Decreased O2 sat. Patient reports pain as 4 on 0-10 scale.    Objective: Vital signs in last 24 hours: Temp:  [97.4 F (36.3 C)-98 F (36.7 C)] 98 F (36.7 C) (06/04 0450) Pulse Rate:  [68-95] 95 (06/04 0450) Resp:  [18] 18 (06/04 1132) BP: (107-157)/(70-88) 157/81 mmHg (06/04 0450) SpO2:  [89 %-100 %] 93 % (06/04 1132)  Intake/Output from previous day: 06/03 0701 - 06/04 0700 In: 765 [I.V.:765] Out: 751 [Urine:750; Stool:1] Intake/Output this shift:    No results found for this basename: HGB,  in the last 72 hours No results found for this basename: WBC, RBC, HCT, PLT,  in the last 72 hours  Recent Labs  01/13/13 0422  NA 134*  K 4.8  CL 103  CO2 21  BUN 34*  CREATININE 1.13*  GLUCOSE 109*  CALCIUM 8.3*    Recent Labs  01/14/13 0206 01/15/13 0437  INR 4.33* 3.99*    Neurologically intact ABD soft Neurovascular intact Sensation intact distally Intact pulses distally Dorsiflexion/Plantar flexion intact  Assessment/Plan:     Advance diet Up with therapy Discharge to SNF Is to go to [facility] for SNF and continued PT/OT May full weight bear as tolerated both legs. Return to see me as outpatient 2 weeks post  Transfer to SNF.  Vitoria Conyer E 01/15/2013, 1:10 PM

## 2013-01-15 NOTE — Progress Notes (Signed)
SNF bed available today, at [facility], if pt is medically stable for d/c. CSW will follow to assist with d/c planning to SNF.  Cori Razor LCSW [phone]

## 2013-01-15 NOTE — Progress Notes (Signed)
[facility] PROGRESS NOTE  Assessment/Plan:  AKI (acute kidney injury): - improved creatinine, NS  IV. - Will d/c ARB, Avoid NSAID's - Strict I and O's, foley.  Nausea and vomiting due to UTI: - initially improved, now worse since on po antibiotic. Will precede phenergan by 30 min prior to Abx.  - urine culture speciated E coli and Morganella, both sensitive to Ciprofloxacin.   Oxygen requirement - patient has a history of asthma, well controlled with occasional Albuterol needs - on 2L [place] this morning, attempted to wean however patient O2 sat 89%. Encourage spirometry, may need 1L [place] O2 temporarily.   Closed fracture of multiple pubic rami: - Mechanical. - Ortho recommended gradual progressive ambulation and bladder scan.  Supratherapeutic INR - Coumadin per pharmacy. - s/p fall, negative CT scan of head.  - multiple ecchymoses noted left arm  A fib: - rate control. - supra Thx INR.   HTN: - control.   Code Status: Full  Family Communication: Pt at bedside  Disposition Plan: SNF 01/16/2013   Consultants:  ortho  Procedures:  none  Antibiotics:  Ciprofloxacin 5/31 >>  HPI/Subjective: - persistent nausea, especially after Ciprofloxacin  Objective: Filed Vitals:   01/14/13 2135 01/15/13 0450 01/15/13 0920 01/15/13 0925  BP: 143/88 157/81    Pulse: 85 95    Temp: 98 F (36.7 C) 98 F (36.7 C)    TempSrc: Oral Oral    Resp: 18 18    Height:      Weight:      SpO2: 100% 96% 89% 94%    Intake/Output Summary (Last 24 hours) at 01/15/13 1041 Last data filed at 01/15/13 0600  Gross per 24 hour  Intake 406.67 ml  Output    451 ml  Net -44.33 ml   Filed Weights   01/08/13 1700  Weight: 72.576 kg (160 lb)    Exam:  General: Alert, awake, oriented x3, in no acute distress.  HEENT: No bruits, no goiter.  Heart: Regular rate and rhythm, without murmurs, rubs, gallops.  Lungs: Good air movement, clear to auscultation. Abdomen: Soft,  nontender, nondistended, positive bowel sounds.  Neuro: Grossly intact, nonfocal.  Data Reviewed: Basic Metabolic Panel:  Recent Labs Lab 01/09/13 0450 01/10/13 0420 01/11/13 0517 01/12/13 0709 01/13/13 0422  NA 138 134* 132* 134* 134*  K 4.2 4.4 4.4 4.9 4.8  CL 103 99 98 101 103  CO2 28 30 22 24 21   GLUCOSE 118* 125* 119* 146* 109*  BUN 18 22 25* 32* 34*  CREATININE 1.55* 1.21* 1.29* 1.34* 1.13*  CALCIUM 8.5 8.4 8.7 8.2* 8.3*   Liver Function Tests: No results found for this basename: AST, ALT, ALKPHOS, BILITOT, PROT, ALBUMIN,  in the last 168 hours No results found for this basename: LIPASE, AMYLASE,  in the last 168 hours No results found for this basename: AMMONIA,  in the last 168 hours CBC:  Recent Labs Lab 01/08/13 1042 01/09/13 0450 01/10/13 0420 01/11/13 0517 01/12/13 0709  WBC  --  7.7 8.6 9.5 7.1  HGB 13.3 11.1* 10.8* 11.0* 11.1*  HCT 39.0 35.5* 33.4* 35.0* 33.6*  MCV  --  90.6 93.0 90.9 90.3  PLT  --  88* 93* 109* 134*   Cardiac Enzymes:  Recent Labs Lab 01/09/13 0450 01/13/13 1433 01/13/13 2018 01/14/13 0206  CKTOTAL 56  --   --   --   TROPONINI  --  <0.30 <0.30 <0.30   BNP (last 3 results) No results found for this  basename: PROBNP,  in the last 8760 hours CBG: No results found for this basename: GLUCAP,  in the last 168 hours  Recent Results (from the past 240 hour(s))  URINE CULTURE     Status: None   Collection Time    01/08/13  3:03 PM      Result Value Range Status   Specimen Description URINE, CATHETERIZED   Final   Special Requests NONE   Final   Culture  Setup Time 01/09/2013 02:00   Final   Colony Count >=100,000 COLONIES/ML   Final   Culture [facility] MORGANII   Final   Report Status 01/11/2013 FINAL   Final   Organism ID, Bacteria MORGANELLA MORGANII   Final  URINE CULTURE     Status: None   Collection Time    01/11/13 11:17 AM      Result Value Range Status   Specimen Description URINE, RANDOM   Final   Special  Requests NONE   Final   Culture  Setup Time 01/11/2013 17:49   Final   Colony Count >=100,000 COLONIES/ML   Final   Culture ESCHERICHIA COLI   Final   Report Status 01/14/2013 FINAL   Final   Organism ID, Bacteria ESCHERICHIA COLI   Final     Studies: No results found.  Scheduled Meds: . ciprofloxacin  500 mg Oral BID  . docusate sodium  100 mg Oral BID  . metoprolol  75 mg Oral BID  . pantoprazole  40 mg Oral Q supper  . raloxifene  60 mg Oral Daily  . verapamil  240 mg Oral Daily  . Warfarin - Pharmacist Dosing Inpatient   Does not apply q1800   Continuous Infusions: . sodium chloride 20 mL/hr at 01/15/13 0600     Pamella Pert  [facility] Pager [phone]. If 8PM-8AM, please contact night-coverage at [email or web address], password [facility] 01/15/2013, 10:41 AM  LOS: 7 days

## 2013-01-15 NOTE — Progress Notes (Signed)
Physical Therapy Treatment Patient Details Name: Joanna Salas MRN: [id number] DOB: 1925/01/03 Today's Date: 01/15/2013 Time: 4098-1191 PT Time Calculation (min): 23 min  PT Assessment / Plan / Recommendation Comments on Treatment Session  Able to take a few steps in room. +2 for safety. Pt fatigues easily. Recommend SNF.     Follow Up Recommendations  SNF     Does the patient have the potential to tolerate intense rehabilitation     Barriers to Discharge        Equipment Recommendations  Rolling walker with 5" wheels    Recommendations for Other Services OT consult  Frequency Min 3X/week   Plan Discharge plan remains appropriate;Frequency needs to be updated    Precautions / Restrictions Precautions Precautions: Fall Restrictions Weight Bearing Restrictions: No RLE Weight Bearing: Weight bearing as tolerated   Pertinent Vitals/Pain R LE with activity-unrated. Pt denies pain at rest.     Mobility  Bed Mobility Bed Mobility: Supine to Sit Supine to Sit: 2: Max assist;HOB elevated;With rails Details for Bed Mobility Assistance: Increased time. Assist for R LE off bed initially and trunk to upright. Utilized bedpad to assist with scooting, positioning.  Transfers Transfers: Sit to Stand;Stand to Sit;Stand Pivot Transfers Sit to Stand: 1: +2 Total assist Sit to Stand: Patient Percentage: 60% Stand to Sit: 1: +2 Total assist Stand to Sit: Patient Percentage: 60% Stand Pivot Transfers: 1: +2 Total assist Stand Pivot Transfers: Patient Percentage: 60% Details for Transfer Assistance: Assist to rise, stabillize, control descent, maneuver with RW. VCS safety, technique, hand placement.  Ambulation/Gait Ambulation/Gait Assistance: 1: +2 Total assist Ambulation/Gait: Patient Percentage: 70% Ambulation Distance (Feet): 7 Feet Assistive device: Rolling walker Ambulation/Gait Assistance Details: Slow gait speed. Difficulty with WBing on R LE. Fatigues easily. Dyspnea 3/4 with  short distance. Gait Pattern: Step-to pattern;Antalgic;Decreased stride length    Exercises     PT Diagnosis:    PT Problem List:   PT Treatment Interventions:     PT Goals Acute Rehab PT Goals Pt will go Supine/Side to Sit: with supervision PT Goal: Supine/Side to Sit - Progress: Progressing toward goal Pt will go Sit to Stand: with supervision PT Goal: Sit to Stand - Progress: Progressing toward goal Pt will Ambulate: 51 - 150 feet;with rolling walker;with supervision PT Goal: Ambulate - Progress: Progressing toward goal  Visit Information  Last PT Received On: 01/15/13 Assistance Needed: +2    Subjective Data  Subjective: i dont wanna go out into the hall Patient Stated Goal: none stated   Cognition  Cognition Arousal/Alertness: Awake/alert Behavior During Therapy: WFL for tasks assessed/performed Overall Cognitive Status: Within Functional Limits for tasks assessed    Balance     End of Session PT - End of Session Equipment Utilized During Treatment: Oxygen Activity Tolerance: Patient limited by fatigue Patient left: in chair;with call bell/phone within reach   GP     [name], MPT Pager: [phone]

## 2013-01-16 DIAGNOSIS — N390 Urinary tract infection, site not specified: Secondary | ICD-10-CM

## 2013-01-16 LAB — PROTIME-INR: INR: 2.77 — ABNORMAL HIGH (ref 0.00–1.49)

## 2013-01-16 MED ORDER — METOPROLOL TARTRATE 100 MG PO TABS: 100.0000 mg | ORAL_TABLET | Freq: Two times a day (BID) | ORAL | Status: DC

## 2013-01-16 MED ORDER — TRAMADOL HCL 50 MG PO TABS: 50.0000 mg | ORAL_TABLET | Freq: Four times a day (QID) | ORAL | Status: DC | PRN

## 2013-01-16 MED ORDER — PROMETHAZINE HCL 12.5 MG PO TABS: 12.5000 mg | ORAL_TABLET | Freq: Four times a day (QID) | ORAL | Status: DC | PRN

## 2013-01-16 MED ORDER — METOPROLOL TARTRATE 100 MG PO TABS
100.0000 mg | ORAL_TABLET | Freq: Two times a day (BID) | ORAL | Status: DC
  Administered 2013-01-16: 100 mg via ORAL
  Filled 2013-01-16 (×3): qty 1

## 2013-01-16 NOTE — Progress Notes (Signed)
Clinical Social Work Department CLINICAL SOCIAL WORK PLACEMENT NOTE 01/16/2013  Patient:  Joanna Salas, Joanna Salas  Account Number:  [id number] Admit date:  01/08/2013  Clinical Social Worker:  Cori Razor, LCSW  Date/time:  01/09/2013 04:02 PM  Clinical Social Work is seeking post-discharge placement for this patient at the following level of care:   SKILLED NURSING   (*CSW will update this form in Epic as items are completed)   01/09/2013  Patient/family provided with [facility] Department of Clinical Social Work's list of facilities offering this level of care within the geographic area requested by the patient (or if unable, by the patient's family).  01/09/2013  Patient/family informed of their freedom to choose among providers that offer the needed level of care, that participate in Medicare, Medicaid or managed care program needed by the patient, have an available bed and are willing to accept the patient.    Patient/family informed of [facility]' ownership interest in [facility], as well as of the fact that they are under no obligation to receive care at this facility.  PASARR submitted to EDS on 01/09/2013 PASARR number received from EDS on 01/09/2013  FL2 transmitted to all facilities in geographic area requested by pt/family on  01/09/2013 FL2 transmitted to all facilities within larger geographic area on   Patient informed that his/her managed care company has contracts with or will negotiate with  certain facilities, including the following:     Patient/family informed of bed offers received:  01/09/2013 Patient chooses bed at [facility] Physician recommends and patient chooses bed at    Patient to be transferred to [facility] on  01/16/2013 Patient to be transferred to facility by [facility]  The following physician request were entered in Epic:   Additional Comments:  Cori Razor LCSW [phone]

## 2013-01-16 NOTE — Discharge Summary (Addendum)
Physician Discharge Summary  Joanna Salas ZOX:[id number] DOB: 05-Jun-1925 DOA: 01/08/2013  PCP: Willow Ora, MD  Admit date: 01/08/2013 Discharge date: 01/16/2013  Time spent: 45 minutes  Recommendations for Nursing home - please check INR daily while on antibiotics.  - please administer 0.5 mg Warfarin on 01/16/2013 and continue with half her home dose while on antibiotics.   Recommendations for Outpatient Follow-up:  1. Follow up with your PCP in 1-2 weeks   Recommendations for primary care physician for things to follow:  1. BMP, CBC recheck in a week.  2. Consider restarting Valsartan 320 mg daily if renal function is stable 3. Consider repeating a urinalysis to document clearance 4. INR clinic follow up as needed. Goal INR 2-3  Discharge Diagnoses:  Principal Problem:   Closed fracture of multiple pubic rami Active Problems:   AKI (acute kidney injury)  Discharge Condition: stable  Diet recommendation: heart healthy  Filed Weights   01/08/13 1700  Weight: 72.576 kg (160 lb)    History of present illness:  79 year old female with past medical history of atrial fibrillation on Coumadin, hypertension who presented to [facility] ED STATUS post fall at home. Patient reported no prodromal symptoms prior to fall such as chest pain, shortness of breath or palpitations. No lightheadedness, no dizziness or loss of consciousness. Patient did not have complaints of abdominal pain, nausea or vomiting but did have an episode of vomiting in ED. No diarrhea or constipation. No blood in the stool or urine.  In ED, vital signs remained stable with blood pressure 119/57, heart rate 66, Tmax 98.6 F and O2 saturation of 94%. Her CBC was significant for platelet count of 109. BMP was unremarkable. INR on this admission is 1.94. Further evaluation included CT head which was negative for acute intracranial findings. X-ray of the right hip shows superior and inferior pubic rami fracture and a CT of the right hip  confirms displaced right pubic rami fracture.   Hospital Course:  AKI (acute kidney injury): improved creatinine with IV hydration. Monitor with weekly BMPs. Avoid NSAIDs. Nausea and vomiting due to UTI: Will precede phenergan by 30 min prior to Abx as needed for nausea. Urine culture speciated E coli and Morganella, both sensitive to Ciprofloxacin. Continue antibiotics until 01/20/2013. Oxygen requirement patient has a history of asthma, well controlled with occasional Albuterol need. Encourage ambulation and mobility and wean off O2 as tolerated. Encourage deep breaths and incentive spirometry.  Closed fracture of multiple pubic rami:  Mechanical. Ortho recommended gradual progressive ambulation.  Supratherapeutic INR - continue Coumadin per pharmacy with goal INR 2-3. S/p fall, negative CT scan of head.  A fib: - rate control. HR in the morning of discharge per my auscultation 85-90. Increased Metoprolol to 100 mg BID from 75 BID. Monitor and adjust dose as indicated further.  HTN: - continue below mentioned regimen. Titrate as clinically indicated. Patient was on Valsartan 320 mg daily prior to admission but given AKI that was discontinued. If her BMP reveals stable renal function in 1-2 weeks, will kindly ask the PCP to please consider restarting in the near future.   Procedures:  none   Consultations:  Ortho  Discharge Exam: Filed Vitals:   01/15/13 1400 01/15/13 1430 01/15/13 2209 01/16/13 0556  BP: 116/73  115/69 155/92  Pulse: 103  101 119  Temp: 98.5 F (36.9 C)  98.6 F (37 C) 98.1 F (36.7 C)  TempSrc: Oral  Oral Oral  Resp: 18  18 18   Height:  Weight:      SpO2: 97% 91% 95% 97%   General: NAD Cardiovascular: irregularly irregular Respiratory: CTA biL  Discharge Instructions      Discharge Orders   Future Appointments Provider Department Dept Phone   02/03/2013 10:15 AM Waymon Budge, MD [facility] [phone]   Future Orders Complete By  Expires     Diet - low sodium heart healthy  As directed     Diet - low sodium heart healthy  As directed     Increase activity slowly  As directed     Increase activity slowly  As directed     Weight bearing as tolerated  As directed         Medication List    STOP taking these medications       ondansetron 4 MG tablet  Commonly known as:  ZOFRAN     valsartan 320 MG tablet  Commonly known as:  DIOVAN      TAKE these medications       acetaminophen 500 MG tablet  Commonly known as:  TYLENOL  Take 1,000 mg by mouth every 6 (six) hours as needed for pain.     albuterol 108 (90 BASE) MCG/ACT inhaler  Commonly known as:  PROAIR HFA  Inhale 2 puffs into the lungs every 4 (four) hours as needed for wheezing or shortness of breath.     ALPRAZolam 0.25 MG tablet  Commonly known as:  XANAX  Take 1 tablet (0.25 mg total) by mouth at bedtime as needed for sleep.     ALPRAZolam 0.25 MG tablet  Commonly known as:  XANAX  Take 1 tablet (0.25 mg total) by mouth at bedtime as needed.     alum & mag hydroxide-simeth 200-200-20 MG/5ML suspension  Commonly known as:  MAALOX/MYLANTA  Take 15 mLs by mouth every 4 (four) hours as needed.     cetirizine 10 MG tablet  Commonly known as:  ZYRTEC  Take 10 mg by mouth at bedtime.     ciprofloxacin 500 MG tablet  Commonly known as:  CIPRO  Take 1 tablet (500 mg total) by mouth 2 (two) times daily.     meclizine 25 MG tablet  Commonly known as:  ANTIVERT  Take 25-50 mg by mouth 3 (three) times daily as needed for dizziness.     metoprolol 100 MG tablet  Commonly known as:  LOPRESSOR  Take 1 tablet (100 mg total) by mouth 2 (two) times daily.     pantoprazole 40 MG tablet  Commonly known as:  PROTONIX  Take 1 tablet (40 mg total) by mouth daily.     polyethylene glycol packet  Commonly known as:  MIRALAX / GLYCOLAX  Take 17 g by mouth daily.     promethazine 12.5 MG tablet  Commonly known as:  PHENERGAN  Take 1 tablet (12.5 mg  total) by mouth every 6 (six) hours as needed.     promethazine 12.5 MG tablet  Commonly known as:  PHENERGAN  Take 12.5-25 mg by mouth at bedtime as needed for nausea (dizziness).     raloxifene 60 MG tablet  Commonly known as:  EVISTA  Take 60 mg by mouth daily.     traMADol 50 MG tablet  Commonly known as:  ULTRAM  Take 1 tablet (50 mg total) by mouth every 6 (six) hours as needed for pain.     verapamil 240 MG 24 hr capsule  Commonly known as:  VERELAN PM  Take 240 mg by mouth daily.     warfarin 2.5 MG tablet  Commonly known as:  COUMADIN  Take 1.25-2.5 mg by mouth daily. 0.5 tab on Sun, Tues, and Thurs; 1 tab all other days.       Follow-up Information   Follow up with NITKA,JAMES E, MD. Schedule an appointment as soon as possible for a visit in 2 weeks.   Contact information:   [address] [place] [address] [phone]       The results of significant diagnostics from this hospitalization (including imaging, microbiology, ancillary and laboratory) are listed below for reference.    Significant Diagnostic Studies: Dg Chest 1 View  01/11/2013   *RADIOLOGY REPORT*  Clinical Data: Shortness of breath.  Weakness.  Hypertension.  CHEST - 1 VIEW  Comparison: 01/08/2013  Findings: Patient rotated to the right.  Moderate cardiomegaly with tortuous thoracic aorta.  Atherosclerosis in the transverse aorta. Mild right hemidiaphragm elevation.  No right-sided pleural effusion.  Cannot exclude trace left pleural fluid or thickening. No pneumothorax.  Chronic interstitial thickening, felt to be similar given differences in technique with decreased inspiratory effort today.  There is increased left lower lobe density.  IMPRESSION: Cardiomegaly and chronic interstitial thickening.  Subtle increased density at the left lung base.  Atelectasis versus early infection.  Consider short-term radiographic follow-up.  Possible trace left pleural fluid versus thickening.   Original  Report Authenticated By: Jeronimo Greaves, M.D.   Dg Chest 2 View  01/08/2013   *RADIOLOGY REPORT*  Clinical Data: Fall with posterior pelvic and right-sided hip pain. Shortness of breath.  CHEST - 2 VIEW  Comparison: 12/02/2012  Findings: Hyperinflation. Lateral view degraded by patient arm position.  Osteopenia.  Accentuation of expected thoracic kyphosis. Vertebral body height loss at an upper thoracic level is moderate and not significantly changed.  Midline trachea.  Moderate cardiomegaly with atherosclerosis in the transverse aorta.  Mild right hemidiaphragm elevation. No pleural effusion or pneumothorax.  No congestive failure.  IMPRESSION: Cardiomegaly and hyperinflation, without acute process.  Osteopenia with a chronic moderate upper thoracic compression deformity.   Original Report Authenticated By: Jeronimo Greaves, M.D.   Dg Hip Complete Right  01/08/2013   *RADIOLOGY REPORT*  Clinical Data: Fall.  Posterior pelvic pain and right side.  RIGHT HIP - COMPLETE 2+ VIEW  Comparison: Abdominal pelvic CT of 12/13/2011  Findings: Surgical sutures project over the lower right abdomen/upper pelvis. Sacroiliac joints are symmetric.  Femoral heads are located. Fractures of the right superior inferior pubic rami.  No femoral neck fracture.  IMPRESSION: Acute right superior and inferior pubic rami fractures.  CT may be informative to evaluate for possible extension into the right hip joint.   Original Report Authenticated By: Jeronimo Greaves, M.D.   Ct Head Wo Contrast  01/12/2013   *RADIOLOGY REPORT*  Clinical Data: Lethargy.  The patient is disoriented.  CT HEAD WITHOUT CONTRAST  Technique:  Contiguous axial images were obtained from the base of the skull through the vertex without contrast.  Comparison: CT head without contrast 01/08/2013 and CT head without contrast 05/29/2008.  Findings: Extensive periventricular and subcortical white matter hypoattenuation is evident bilaterally.  No acute cortical infarct,  hemorrhage, or mass lesion is present.  The ventricles are normal size.  No significant extra-axial fluid collection is evident.  Chronic mucosal thickening is evident in the maxillary sinuses bilaterally, right greater than left.  The paranasal sinuses and mastoid air cells are clear.  Atherosclerotic calcifications are evident  within the cavernous carotid arteries bilaterally.  IMPRESSION:  1.  Extensive periventricular and subcortical white matter hypoattenuation bilaterally.  This likely reflects the sequelae of chronic microvascular ischemia. 2.  No acute intracranial abnormality or significant interval change.   Original Report Authenticated By: Marin Roberts, M.D.   Ct Head Wo Contrast  01/08/2013   *RADIOLOGY REPORT*  Clinical Data: Recent traumatic injury with headache  CT HEAD WITHOUT CONTRAST  Technique:  Contiguous axial images were obtained from the base of the skull through the vertex without contrast.  Comparison: 05/19/08  Findings: The bony calvarium is intact.  Mild mucosal thickening is noted within the maxillary antra bilaterally.  The ventricles are normal size configuration.  Mild chronic white matter ischemic change is seen similar to that noted on prior exam.  No acute hemorrhage, acute infarction or space-occupying mass lesion is noted.  IMPRESSION: Chronic changes without acute abnormality.   Original Report Authenticated By: Alcide Clever, M.D.   Ct Hip Right Wo Contrast  01/08/2013   *RADIOLOGY REPORT*  Clinical Data: Right hip pain status post fall.  Question fracture.  CT OF THE RIGHT HIP WITHOUT CONTRAST  Technique:  Multidetector CT imaging was performed according to the standard protocol. Multiplanar CT image reconstructions were also generated.  Comparison: Right hip and pelvic radiographs 01/08/2013.  Pelvic CT 12/13/2011.  Findings: Examination is limited to the right hip and inferior right hemi pelvis.  The entire pelvis is not imaged.  As demonstrated on the earlier  radiographs, there are mildly displaced fractures involving the right superior and inferior pubic rami.  Neither fracture demonstrates intra-articular extension to the hip joint.  There is no evidence of femoral head/neck fracture or avascular necrosis.  Mild subchondral cyst formation is present anteriorly in the acetabulum.  There is no evidence of large pelvic hematoma or hip joint effusion.  IMPRESSION:  1.  Mildly displaced fractures of the right superior and inferior pubic rami.  No intra-articular extension identified. 2.  No evidence of proximal femur fracture.   Original Report Authenticated By: Carey Bullocks, M.D.   Dg Shoulder Right Port  01/11/2013   *RADIOLOGY REPORT*  Clinical Data: Fall, right shoulder pain  PORTABLE RIGHT SHOULDER - 2+ VIEW  Comparison: None.  Findings: No fracture or dislocation is seen.  Mild degenerative changes of the acromioclavicular joint.  The visualized right lung is essentially clear.  IMPRESSION: No fracture or dislocation is seen.   Original Report Authenticated By: Charline Bills, M.D.    Microbiology: Recent Results (from the past 240 hour(s))  URINE CULTURE     Status: None   Collection Time    01/08/13  3:03 PM      Result Value Range Status   Specimen Description URINE, CATHETERIZED   Final   Special Requests NONE   Final   Culture  Setup Time 01/09/2013 02:00   Final   Colony Count >=100,000 COLONIES/ML   Final   Culture [facility] MORGANII   Final   Report Status 01/11/2013 FINAL   Final   Organism ID, Bacteria MORGANELLA MORGANII   Final  URINE CULTURE     Status: None   Collection Time    01/11/13 11:17 AM      Result Value Range Status   Specimen Description URINE, RANDOM   Final   Special Requests NONE   Final   Culture  Setup Time 01/11/2013 17:49   Final   Colony Count >=100,000 COLONIES/ML   Final   Culture ESCHERICHIA COLI  Final   Report Status 01/14/2013 FINAL   Final   Organism ID, Bacteria ESCHERICHIA COLI   Final      Labs: Basic Metabolic Panel:  Recent Labs Lab 01/10/13 0420 01/11/13 0517 01/12/13 0709 01/13/13 0422  NA 134* 132* 134* 134*  K 4.4 4.4 4.9 4.8  CL 99 98 101 103  CO2 30 22 24 21   GLUCOSE 125* 119* 146* 109*  BUN 22 25* 32* 34*  CREATININE 1.21* 1.29* 1.34* 1.13*  CALCIUM 8.4 8.7 8.2* 8.3*   Liver Function Tests: No results found for this basename: AST, ALT, ALKPHOS, BILITOT, PROT, ALBUMIN,  in the last 168 hours No results found for this basename: LIPASE, AMYLASE,  in the last 168 hours No results found for this basename: AMMONIA,  in the last 168 hours CBC:  Recent Labs Lab 01/10/13 0420 01/11/13 0517 01/12/13 0709  WBC 8.6 9.5 7.1  HGB 10.8* 11.0* 11.1*  HCT 33.4* 35.0* 33.6*  MCV 93.0 90.9 90.3  PLT 93* 109* 134*   Cardiac Enzymes:  Recent Labs Lab 01/13/13 1433 01/13/13 2018 01/14/13 0206  TROPONINI <0.30 <0.30 <0.30    Signed:  Pamella Pert  [facility] 01/16/2013, 10:44 AM

## 2013-01-16 NOTE — Plan of Care (Signed)
Problem: Phase III Progression Outcomes Goal: Ambulate BID with assist as able Outcome: Not Met (add Reason) PT GOING TO REHAB UPON D/C

## 2013-01-18 ENCOUNTER — Telehealth: Payer: Self-pay | Admitting: Internal Medicine

## 2013-01-18 NOTE — Telephone Encounter (Signed)
Needs OV this week, f/u hospital admission

## 2013-01-19 ENCOUNTER — Encounter (HOSPITAL_COMMUNITY): Payer: Self-pay | Admitting: Emergency Medicine

## 2013-01-19 ENCOUNTER — Inpatient Hospital Stay (HOSPITAL_COMMUNITY): Payer: Medicare Other

## 2013-01-19 ENCOUNTER — Inpatient Hospital Stay (HOSPITAL_COMMUNITY)
Admission: EM | Admit: 2013-01-19 | Discharge: 2013-01-27 | DRG: 291 | Disposition: A | Discharge status: Skilled Nursing Facility | Payer: Medicare Other | Attending: Internal Medicine | Admitting: Internal Medicine

## 2013-01-19 ENCOUNTER — Emergency Department (HOSPITAL_COMMUNITY): Payer: Medicare Other

## 2013-01-19 DIAGNOSIS — J9601 Acute respiratory failure with hypoxia: Secondary | ICD-10-CM

## 2013-01-19 DIAGNOSIS — I10 Essential (primary) hypertension: Secondary | ICD-10-CM

## 2013-01-19 DIAGNOSIS — N390 Urinary tract infection, site not specified: Secondary | ICD-10-CM

## 2013-01-19 DIAGNOSIS — I2789 Other specified pulmonary heart diseases: Secondary | ICD-10-CM | POA: Diagnosis present

## 2013-01-19 DIAGNOSIS — E873 Alkalosis: Secondary | ICD-10-CM

## 2013-01-19 DIAGNOSIS — I5031 Acute diastolic (congestive) heart failure: Principal | ICD-10-CM

## 2013-01-19 DIAGNOSIS — N281 Cyst of kidney, acquired: Secondary | ICD-10-CM

## 2013-01-19 DIAGNOSIS — I129 Hypertensive chronic kidney disease with stage 1 through stage 4 chronic kidney disease, or unspecified chronic kidney disease: Secondary | ICD-10-CM | POA: Diagnosis present

## 2013-01-19 DIAGNOSIS — J309 Allergic rhinitis, unspecified: Secondary | ICD-10-CM | POA: Diagnosis present

## 2013-01-19 DIAGNOSIS — J3089 Other allergic rhinitis: Secondary | ICD-10-CM

## 2013-01-19 DIAGNOSIS — I509 Heart failure, unspecified: Secondary | ICD-10-CM

## 2013-01-19 DIAGNOSIS — F411 Generalized anxiety disorder: Secondary | ICD-10-CM

## 2013-01-19 DIAGNOSIS — W010XXA Fall on same level from slipping, tripping and stumbling without subsequent striking against object, initial encounter: Secondary | ICD-10-CM | POA: Diagnosis present

## 2013-01-19 DIAGNOSIS — M810 Age-related osteoporosis without current pathological fracture: Secondary | ICD-10-CM

## 2013-01-19 DIAGNOSIS — E785 Hyperlipidemia, unspecified: Secondary | ICD-10-CM

## 2013-01-19 DIAGNOSIS — E876 Hypokalemia: Secondary | ICD-10-CM

## 2013-01-19 DIAGNOSIS — I38 Endocarditis, valve unspecified: Secondary | ICD-10-CM | POA: Diagnosis present

## 2013-01-19 DIAGNOSIS — I4891 Unspecified atrial fibrillation: Secondary | ICD-10-CM

## 2013-01-19 DIAGNOSIS — J45909 Unspecified asthma, uncomplicated: Secondary | ICD-10-CM | POA: Diagnosis present

## 2013-01-19 DIAGNOSIS — I340 Nonrheumatic mitral (valve) insufficiency: Secondary | ICD-10-CM

## 2013-01-19 DIAGNOSIS — S329XXD Fracture of unspecified parts of lumbosacral spine and pelvis, subsequent encounter for fracture with routine healing: Secondary | ICD-10-CM

## 2013-01-19 DIAGNOSIS — J302 Other seasonal allergic rhinitis: Secondary | ICD-10-CM

## 2013-01-19 DIAGNOSIS — R32 Unspecified urinary incontinence: Secondary | ICD-10-CM | POA: Diagnosis present

## 2013-01-19 DIAGNOSIS — R600 Localized edema: Secondary | ICD-10-CM

## 2013-01-19 DIAGNOSIS — M1990 Unspecified osteoarthritis, unspecified site: Secondary | ICD-10-CM

## 2013-01-19 DIAGNOSIS — Z860100 Personal history of colon polyps, unspecified: Secondary | ICD-10-CM

## 2013-01-19 DIAGNOSIS — R011 Cardiac murmur, unspecified: Secondary | ICD-10-CM | POA: Diagnosis present

## 2013-01-19 DIAGNOSIS — Z87442 Personal history of urinary calculi: Secondary | ICD-10-CM

## 2013-01-19 DIAGNOSIS — N183 Chronic kidney disease, stage 3 unspecified: Secondary | ICD-10-CM

## 2013-01-19 DIAGNOSIS — J9600 Acute respiratory failure, unspecified whether with hypoxia or hypercapnia: Secondary | ICD-10-CM | POA: Diagnosis present

## 2013-01-19 DIAGNOSIS — Z7901 Long term (current) use of anticoagulants: Secondary | ICD-10-CM

## 2013-01-19 DIAGNOSIS — R609 Edema, unspecified: Secondary | ICD-10-CM | POA: Diagnosis present

## 2013-01-19 DIAGNOSIS — B9620 Unspecified Escherichia coli [E. coli] as the cause of diseases classified elsewhere: Secondary | ICD-10-CM

## 2013-01-19 DIAGNOSIS — S32599A Other specified fracture of unspecified pubis, initial encounter for closed fracture: Secondary | ICD-10-CM

## 2013-01-19 DIAGNOSIS — R0600 Dyspnea, unspecified: Secondary | ICD-10-CM

## 2013-01-19 DIAGNOSIS — N179 Acute kidney failure, unspecified: Secondary | ICD-10-CM

## 2013-01-19 DIAGNOSIS — D689 Coagulation defect, unspecified: Secondary | ICD-10-CM

## 2013-01-19 DIAGNOSIS — I059 Rheumatic mitral valve disease, unspecified: Secondary | ICD-10-CM | POA: Diagnosis present

## 2013-01-19 DIAGNOSIS — I4821 Permanent atrial fibrillation: Secondary | ICD-10-CM

## 2013-01-19 DIAGNOSIS — Z8719 Personal history of other diseases of the digestive system: Secondary | ICD-10-CM

## 2013-01-19 DIAGNOSIS — I359 Nonrheumatic aortic valve disorder, unspecified: Secondary | ICD-10-CM | POA: Diagnosis present

## 2013-01-19 DIAGNOSIS — K5730 Diverticulosis of large intestine without perforation or abscess without bleeding: Secondary | ICD-10-CM

## 2013-01-19 HISTORY — DX: Fracture of unspecified parts of lumbosacral spine and pelvis, initial encounter for closed fracture: S32.9XXA

## 2013-01-19 HISTORY — DX: Nonrheumatic mitral (valve) insufficiency: I34.0

## 2013-01-19 HISTORY — DX: Permanent atrial fibrillation: I48.21

## 2013-01-19 LAB — COMPREHENSIVE METABOLIC PANEL
ALT: 22 U/L (ref 0–35)
AST: 25 U/L (ref 0–37)
CO2: 29 mEq/L (ref 19–32)
Chloride: 103 mEq/L (ref 96–112)
GFR calc Af Amer: 76 mL/min — ABNORMAL LOW (ref >90)
GFR calc non Af Amer: 65 mL/min — ABNORMAL LOW (ref >90)
Glucose, Bld: 96 mg/dL (ref 70–99)
Sodium: 139 mEq/L (ref 135–145)
Total Bilirubin: 1 mg/dL (ref 0.3–1.2)

## 2013-01-19 LAB — CBC WITH DIFFERENTIAL/PLATELET
Basophils Absolute: 0 10*3/uL (ref 0.0–0.1)
HCT: 37.5 % (ref 36.0–46.0)
Lymphocytes Relative: 13 % (ref 12–46)
Lymphs Abs: 1.1 10*3/uL (ref 0.7–4.0)
MCV: 91.2 fL (ref 78.0–100.0)
Monocytes Absolute: 1 10*3/uL (ref 0.1–1.0)
Neutro Abs: 5.8 10*3/uL (ref 1.7–7.7)
Platelets: 185 10*3/uL (ref 150–400)
RBC: 4.11 MIL/uL (ref 3.87–5.11)
RDW: 17.4 % — ABNORMAL HIGH (ref 11.5–15.5)
WBC: 8 10*3/uL (ref 4.0–10.5)

## 2013-01-19 LAB — MAGNESIUM: Magnesium: 1.8 mg/dL (ref 1.5–2.5)

## 2013-01-19 LAB — TROPONIN I: Troponin I: <0.3 ng/mL

## 2013-01-19 LAB — MRSA PCR SCREENING: MRSA by PCR: NEGATIVE

## 2013-01-19 MED ORDER — DILTIAZEM HCL 100 MG IV SOLR
5.0000 mg/h | INTRAVENOUS | Status: AC
  Administered 2013-01-20: 5 mg/h via INTRAVENOUS
  Filled 2013-01-19: qty 100

## 2013-01-19 MED ORDER — SENNOSIDES-DOCUSATE SODIUM 8.6-50 MG PO TABS
1.0000 | ORAL_TABLET | Freq: Every evening | ORAL | Status: DC | PRN
  Administered 2013-01-25: 1 via ORAL
  Filled 2013-01-19 (×2): qty 1

## 2013-01-19 MED ORDER — METOPROLOL TARTRATE 100 MG PO TABS
100.0000 mg | ORAL_TABLET | Freq: Two times a day (BID) | ORAL | Status: DC
  Administered 2013-01-19 – 2013-01-21 (×4): 100 mg via ORAL
  Filled 2013-01-19 (×5): qty 1

## 2013-01-19 MED ORDER — ONDANSETRON HCL 4 MG PO TABS: 4.0000 mg | ORAL_TABLET | Freq: Four times a day (QID) | ORAL | Status: DC | PRN

## 2013-01-19 MED ORDER — DILTIAZEM HCL 100 MG IV SOLR
5.0000 mg/h | INTRAVENOUS | Status: DC
  Administered 2013-01-19: 5 mg/h via INTRAVENOUS
  Filled 2013-01-19: qty 100

## 2013-01-19 MED ORDER — WARFARIN SODIUM 2 MG PO TABS
2.0000 mg | ORAL_TABLET | Freq: Once | ORAL | Status: AC
  Administered 2013-01-19: 2 mg via ORAL
  Filled 2013-01-19: qty 1

## 2013-01-19 MED ORDER — SODIUM CHLORIDE 0.9 % IV SOLN: 250.0000 mL | INTRAVENOUS | Status: DC | PRN

## 2013-01-19 MED ORDER — ONDANSETRON HCL 4 MG/2ML IJ SOLN
4.0000 mg | Freq: Four times a day (QID) | INTRAMUSCULAR | Status: DC | PRN
  Administered 2013-01-25: 4 mg via INTRAVENOUS
  Filled 2013-01-19 (×2): qty 2

## 2013-01-19 MED ORDER — WARFARIN SODIUM 2.5 MG PO TABS
2.5000 mg | ORAL_TABLET | ORAL | Status: DC
  Filled 2013-01-19: qty 1

## 2013-01-19 MED ORDER — SODIUM CHLORIDE 0.9 % IJ SOLN: 3.0000 mL | INTRAMUSCULAR | Status: DC | PRN

## 2013-01-19 MED ORDER — DILTIAZEM HCL 25 MG/5ML IV SOLN
15.0000 mg | Freq: Once | INTRAVENOUS | Status: AC
  Administered 2013-01-19: 15 mg via INTRAVENOUS
  Filled 2013-01-19: qty 5

## 2013-01-19 MED ORDER — POLYETHYLENE GLYCOL 3350 17 G PO PACK
17.0000 g | PACK | Freq: Every day | ORAL | Status: DC
  Administered 2013-01-21 – 2013-01-26 (×4): 17 g via ORAL
  Filled 2013-01-19 (×9): qty 1

## 2013-01-19 MED ORDER — WARFARIN 1.25 MG HALF TABLET: 1.2500 mg | ORAL_TABLET | ORAL | Status: DC

## 2013-01-19 MED ORDER — ACETAMINOPHEN 325 MG PO TABS
650.0000 mg | ORAL_TABLET | Freq: Four times a day (QID) | ORAL | Status: DC | PRN
  Administered 2013-01-23: 650 mg via ORAL
  Administered 2013-01-26: 325 mg via ORAL
  Administered 2013-01-27: 650 mg via ORAL
  Filled 2013-01-19 (×3): qty 2
  Filled 2013-01-19: qty 1

## 2013-01-19 MED ORDER — ACETAMINOPHEN 650 MG RE SUPP: 650.0000 mg | Freq: Four times a day (QID) | RECTAL | Status: DC | PRN

## 2013-01-19 MED ORDER — SODIUM CHLORIDE 0.9 % IJ SOLN
3.0000 mL | Freq: Two times a day (BID) | INTRAMUSCULAR | Status: DC
  Administered 2013-01-19: 3 mL via INTRAVENOUS

## 2013-01-19 MED ORDER — VERAPAMIL HCL ER 240 MG PO TBCR
240.0000 mg | EXTENDED_RELEASE_TABLET | Freq: Every day | ORAL | Status: DC
  Filled 2013-01-19: qty 1

## 2013-01-19 MED ORDER — ALPRAZOLAM 0.25 MG PO TABS
0.2500 mg | ORAL_TABLET | Freq: Every evening | ORAL | Status: DC | PRN
  Administered 2013-01-19 – 2013-01-26 (×5): 0.25 mg via ORAL
  Filled 2013-01-19 (×5): qty 1

## 2013-01-19 MED ORDER — IOHEXOL 350 MG/ML SOLN
80.0000 mL | Freq: Once | INTRAVENOUS | Status: AC | PRN
  Administered 2013-01-19: 80 mL via INTRAVENOUS

## 2013-01-19 MED ORDER — TRAMADOL HCL 50 MG PO TABS
50.0000 mg | ORAL_TABLET | Freq: Four times a day (QID) | ORAL | Status: DC | PRN
  Filled 2013-01-19: qty 1

## 2013-01-19 MED ORDER — WARFARIN - PHARMACIST DOSING INPATIENT: Freq: Every day | Status: DC

## 2013-01-19 MED ORDER — FUROSEMIDE 10 MG/ML IJ SOLN
40.0000 mg | Freq: Two times a day (BID) | INTRAMUSCULAR | Status: DC
  Administered 2013-01-19: 40 mg via INTRAVENOUS
  Filled 2013-01-19 (×3): qty 4

## 2013-01-19 MED ORDER — LORATADINE 10 MG PO TABS
10.0000 mg | ORAL_TABLET | Freq: Every day | ORAL | Status: DC
  Administered 2013-01-19 – 2013-01-27 (×9): 10 mg via ORAL
  Filled 2013-01-19 (×9): qty 1

## 2013-01-19 MED ORDER — FUROSEMIDE 10 MG/ML IJ SOLN
20.0000 mg | Freq: Once | INTRAMUSCULAR | Status: AC
  Administered 2013-01-19: 20 mg via INTRAVENOUS
  Filled 2013-01-19: qty 2

## 2013-01-19 MED ORDER — PANTOPRAZOLE SODIUM 40 MG PO TBEC
40.0000 mg | DELAYED_RELEASE_TABLET | Freq: Every day | ORAL | Status: DC
  Administered 2013-01-19 – 2013-01-27 (×9): 40 mg via ORAL
  Filled 2013-01-19 (×8): qty 1

## 2013-01-19 MED ORDER — VERAPAMIL HCL ER 240 MG PO CP24: 240.0000 mg | ORAL_CAPSULE | Freq: Every day | ORAL | Status: DC

## 2013-01-19 NOTE — H&P (Signed)
[facility]          History and Physical    PCP:   Default, Provider, MD   Chief Complaint:  Bilateral lower extremity edema, shortness of breath  HPI: Patient is a pleasant 79 year old white woman with past medical history significant for atrial fibrillation maintained on chronic anticoagulation with Coumadin, hypertension, who recently was discharged to skilled nursing facility from [facility] long hospital on June 5 following pelvic fractures. During that hospitalization she developed acute renal failure. She returns today to the hospital as she has had increasing bilateral lower extremity edema all the way up to her thighs. She has also had significant dyspnea on exertion that is limiting her physical therapy at skilled nursing facility. Workup in the emergency department is significant for a BNP of  5000, a chest x-ray that does not show any pulmonary edema. She was also found to be somewhat hypoxic 90% on 2 L. She is also in atrial fibrillation with rapid ventricular response with heart rates around 140s. We have been asked to admit her for further evaluation and management.  Allergies:   Allergies  Allergen Reactions  . Pneumococcal Vaccines   . Atorvastatin Other (See Comments)    Severe Leg Cramps  . Ciprofloxacin Hcl Nausea And Vomiting  . Morphine Sulfate Nausea Only  . Penicillins     REACTION: unspecified  . Prednisone     unknown  . Sulfonamide Derivatives Nausea Only      Past Medical History  Diagnosis Date  . Personal history of colonic polyps   . Atrial fibrillation   . Allergic rhinitis, cause unspecified   . Unspecified asthma(493.90)   . Personal history of other diseases of digestive system   . Personal history of urinary calculi   . Osteoporosis, unspecified   . DJD (degenerative joint disease)   . HTN (hypertension)   . Hyperlipidemia   . Anxiety state, unspecified     Past Surgical History  Procedure Laterality Date  .  Appendectomy    . Tonsillectomy    . Hemicolectomy      right    Prior to Admission medications   Medication Sig Start Date End Date Taking? Authorizing Provider  acetaminophen (TYLENOL) 500 MG tablet Take 1,000 mg by mouth every 6 (six) hours as needed for pain.   Yes Historical Provider, MD  albuterol (PROAIR HFA) 108 (90 BASE) MCG/ACT inhaler Inhale 2 puffs into the lungs every 4 (four) hours as needed for wheezing or shortness of breath. 11/20/12  Yes Waymon Budge, MD  ALPRAZolam Prudy Feeler) 0.25 MG tablet Take 1 tablet (0.25 mg total) by mouth at bedtime as needed. 01/14/13  Yes Marinda Elk, MD  alum & mag hydroxide-simeth (MAALOX/MYLANTA) 200-200-20 MG/5ML suspension Take 15 mLs by mouth every 4 (four) hours as needed. 01/14/13  Yes Marinda Elk, MD  cetirizine (ZYRTEC) 10 MG tablet Take 10 mg by mouth at bedtime.   Yes Historical Provider, MD  ciprofloxacin (CIPRO) 500 MG tablet Take 1 tablet (500 mg total) by mouth 2 (two) times daily. 01/14/13 01/20/13 Yes Marinda Elk, MD  meclizine (ANTIVERT) 25 MG tablet Take 25-50 mg by mouth 3 (three) times daily as needed for dizziness.   Yes Historical Provider, MD  metoprolol (LOPRESSOR) 100 MG tablet Take 1 tablet (100 mg total) by mouth 2 (two) times daily. 01/16/13  Yes Costin Gherghe, MD  pantoprazole (PROTONIX) 40 MG tablet Take 1 tablet (40 mg total) by mouth daily. 01/14/13  Yes  Marinda Elk, MD  polyethylene glycol [facility] / Ethelene Hal) packet Take 17 g by mouth daily. 01/11/13  Yes Marinda Elk, MD  promethazine (PHENERGAN) 12.5 MG tablet Take 12.5-25 mg by mouth at bedtime as needed for nausea (dizziness).   Yes Historical Provider, MD  raloxifene (EVISTA) 60 MG tablet Take 60 mg by mouth daily.     Yes Historical Provider, MD  traMADol (ULTRAM) 50 MG tablet Take 1 tablet (50 mg total) by mouth every 6 (six) hours as needed for pain. 01/16/13  Yes Costin Gherghe, MD  verapamil (VERELAN PM) 240 MG 24 hr capsule Take 240  mg by mouth daily.     Yes Historical Provider, MD  warfarin (COUMADIN) 2.5 MG tablet Take 1.25-2.5 mg by mouth daily. 0.5 tab on Sun, Tues, and Thurs; 1 tab all other days.   Yes Historical Provider, MD    Social History:  reports that she has never smoked. She has never used smokeless tobacco. She reports that she does not drink alcohol or use illicit drugs.  Family History  Problem Relation Age of Onset  . Heart disease Mother   . Heart disease Father     Review of Systems:  Constitutional: Denies fever, chills, diaphoresis, appetite change and fatigue.  HEENT: Denies photophobia, eye pain, redness, hearing loss, ear pain, congestion, sore throat, rhinorrhea, sneezing, mouth sores, trouble swallowing, neck pain, neck stiffness and tinnitus.   Respiratory: Denies  cough, chest tightness,  and wheezing.   Cardiovascular: Denies chest pain, palpitations. Gastrointestinal: Denies nausea, vomiting, abdominal pain, diarrhea, constipation, blood in stool and abdominal distention.  Genitourinary: Denies dysuria, urgency, frequency, hematuria, flank pain and difficulty urinating.  Endocrine: Denies: hot or cold intolerance, sweats, changes in hair or nails, polyuria, polydipsia. Musculoskeletal: Denies myalgias, back pain, joint swelling, arthralgias and gait problem.  Skin: Denies pallor, rash and wound.  Neurological: Denies dizziness, seizures, syncope, weakness, light-headedness, numbness and headaches.  Hematological: Denies adenopathy. Easy bruising, personal or family bleeding history  Psychiatric/Behavioral: Denies suicidal ideation, mood changes, confusion, nervousness, sleep disturbance and agitation   Physical Exam: Blood pressure 145/87, pulse 128, temperature 97.1 F (36.2 C), temperature source Oral, resp. rate 27, SpO2 94.00%. General: Alert, awake, oriented x3, appears somewhat anxious. HEENT: Normocephalic, atraumatic, pupils equal and reactive to light, extraocular  movements intact, moist mucous membranes. Neck: Supple, no lymphadenopathy, no bruits, no goiter. Cardiovascular: Irregular, tachycardic, no murmurs rubs or gallops identified. Lungs: Clear to auscultation bilaterally. Abdomen: Soft, nontender, nondistended, positive bowel sounds, no masses or organomegaly noted. Extremities: 2-3+ edema bilaterally all the way up to her thighs. Positive pedal pulses. She has some areas especially over the left shin where the skin is open and weeping. Neurologic: Grossly intact and nonfocal.  Labs on Admission:  Results for orders placed during the hospital encounter of 01/19/13 (from the past 48 hour(s))  CBC WITH DIFFERENTIAL     Status: Abnormal   Collection Time    01/19/13  5:09 PM      Result Value Range   WBC 8.0  4.0 - 10.5 K/uL   RBC 4.11  3.87 - 5.11 MIL/uL   Hemoglobin 12.3  12.0 - 15.0 g/dL   HCT 16.1  09.6 - 04.5 %   MCV 91.2  78.0 - 100.0 fL   MCH 29.9  26.0 - 34.0 pg   MCHC 32.8  30.0 - 36.0 g/dL   RDW 40.9 (*) 81.1 - 91.4 %   Platelets 185  150 - 400 K/uL  Neutrophils Relative % 73  43 - 77 %   Neutro Abs 5.8  1.7 - 7.7 K/uL   Lymphocytes Relative 13  12 - 46 %   Lymphs Abs 1.1  0.7 - 4.0 K/uL   Monocytes Relative 12  3 - 12 %   Monocytes Absolute 1.0  0.1 - 1.0 K/uL   Eosinophils Relative 1  0 - 5 %   Eosinophils Absolute 0.1  0.0 - 0.7 K/uL   Basophils Relative 0  0 - 1 %   Basophils Absolute 0.0  0.0 - 0.1 K/uL  COMPREHENSIVE METABOLIC PANEL     Status: Abnormal   Collection Time    01/19/13  5:09 PM      Result Value Range   Sodium 139  135 - 145 mEq/L   Potassium 3.8  3.5 - 5.1 mEq/L   Chloride 103  96 - 112 mEq/L   CO2 29  19 - 32 mEq/L   Glucose, Bld 96  70 - 99 mg/dL   BUN 15  6 - 23 mg/dL   Creatinine, Ser 1.47  0.50 - 1.10 mg/dL   Calcium 8.1 (*) 8.4 - 10.5 mg/dL   Total Protein 5.3 (*) 6.0 - 8.3 g/dL   Albumin 2.8 (*) 3.5 - 5.2 g/dL   AST 25  0 - 37 U/L   ALT 22  0 - 35 U/L   Alkaline Phosphatase 78  39 -  117 U/L   Total Bilirubin 1.0  0.3 - 1.2 mg/dL   GFR calc non Af Amer 65 (*) >90 mL/min   GFR calc Af Amer 76 (*) >90 mL/min   Comment:            The eGFR has been calculated     using the CKD EPI equation.     This calculation has not been     validated in all clinical     situations.     eGFR's persistently     <90 mL/min signify     possible Chronic Kidney Disease.  PRO B NATRIURETIC PEPTIDE     Status: Abnormal   Collection Time    01/19/13  5:09 PM      Result Value Range   Pro B Natriuretic peptide (BNP) 4950.0 (*) 0 - 450 pg/mL  PROTIME-INR     Status: Abnormal   Collection Time    01/19/13  5:09 PM      Result Value Range   Prothrombin Time 22.1 (*) 11.6 - 15.2 seconds   INR 2.03 (*) 0.00 - 1.49  POCT I-STAT TROPONIN I     Status: None   Collection Time    01/19/13  5:23 PM      Result Value Range   Troponin i, poc 0.04  0.00 - 0.08 ng/mL   Comment 3            Comment: Due to the release kinetics of cTnI,     a negative result within the first hours     of the onset of symptoms does not rule out     myocardial infarction with certainty.     If myocardial infarction is still suspected,     repeat the test at appropriate intervals.    Radiological Exams on Admission: Dg Chest 2 View  01/19/2013   *RADIOLOGY REPORT*  Clinical Data: Lower extremity edema.  History of atrial fibrillation.  CHEST - 2 VIEW  Comparison: 01/11/2013 chest x-ray and thoracic spine MRI on 01/27/2005.  Findings: No overt pulmonary edema is identified.  Heart is moderately enlarged and stable appearance.  There are small bilateral pleural effusions.  Bony thorax shows osteopenia with some loss of height of a mid thoracic vertebral body.  This has been previously demonstrated by MRI of the thoracic spine to represent a chronic compression fracture at the T5 level.  IMPRESSION: Cardiomegaly and small bilateral pleural effusions.  No overt pulmonary edema.   Original Report Authenticated By: Irish Lack, M.D.    Assessment/Plan Principal Problem:   Acute respiratory failure with hypoxia Active Problems:   HYPERTENSION   Acute CHF (congestive heart failure)   Atrial fibrillation with RVR   Anticoagulated on Coumadin   Acute hypoxemic respiratory failure -Believe this is multifactorial. -She has never been diagnosed with congestive heart failure, however with massive bilateral lower extremity edema and BNP of 5000 this is something that needs to be considered. We'll order 2-D echo to further clarify. Strict intake and output, daily weights, admit to telemetry floor, Lasix 40 mg daily and strive for negative fluid balance. Cycle troponins. -The fact that her chest x-ray does not show any pulmonary edema, I have a hard time explaining her hypoxia  by  CHF. She is certainly a setup for pulmonary embolism given her sedentarism following her recent pelvic fractures. High probability by Wells criteria given recent orthopedic fractures, hypoxemia and tachycardia. Of note she is chronically anticoagulated on Coumadin and her INR is therapeutic today at 2.03. Given that I do not have an alternate explanation for her hypoxia, I think it is reasonable to perform a CT angiogram of the chest. If she does have pulmonary embolism then consideration to an IVC filter will to be given.  Atrial fibrillation with rapid ventricular response -Cardizem bolus given in the emergency department. -Will start on Cardizem infusion. -Will continue her home doses of verapamil and metoprolol. -Check TSH, cycle troponins, check magnesium level. [facility] cardiology to see in the morning.  ? Acute CHF, type unknown -Check 2-D echo, treatment as above.  DVT prophylaxis -Already fully anticoagulated on Coumadin.  Time Spent on Admission: 80 minutes  HERNANDEZ ACOSTA,ESTELA [facility] Pager: [phone] 01/19/2013, 7:31 PM

## 2013-01-19 NOTE — ED Notes (Signed)
Hernandez, MD at bedside.

## 2013-01-19 NOTE — ED Provider Notes (Signed)
I saw and evaluated the patient, reviewed the resident's note and I agree with the findings and plan.   Goro Wenrick D Mccormick [place], MD 01/19/13 2347 

## 2013-01-19 NOTE — ED Notes (Signed)
Patient transported to X-ray 

## 2013-01-19 NOTE — Progress Notes (Signed)
ANTICOAGULATION CONSULT NOTE - Initial Consult  Pharmacy Consult for Coumadin Indication: atrial fibrillation  Allergies  Allergen Reactions  . Pneumococcal Vaccines   . Atorvastatin Other (See Comments)    Severe Leg Cramps  . Ciprofloxacin Hcl Nausea And Vomiting  . Morphine Sulfate Nausea Only  . Penicillins     REACTION: unspecified  . Prednisone     unknown  . Sulfonamide Derivatives Nausea Only    Patient Measurements: Height: 5\' 6"  (167.6 cm) Weight: 179 lb 14.3 oz (81.6 kg) IBW/kg (Calculated) : 59.3 Heparin Dosing Weight:   Vital Signs: Temp: 98.1 F (36.7 C) (06/08 2118) Temp src: Oral (06/08 2118) BP: 131/69 mmHg (06/08 2118) Pulse Rate: 122 (06/08 2118)  Labs:  Recent Labs  01/19/13 1709  HGB 12.3  HCT 37.5  PLT 185  LABPROT 22.1*  INR 2.03*  CREATININE 0.80    Estimated Creatinine Clearance: 55.4 ml/min (by C-G formula based on Cr of 0.8).   Medical History: Past Medical History  Diagnosis Date  . Personal history of colonic polyps   . Atrial fibrillation   . Allergic rhinitis, cause unspecified   . Unspecified asthma(493.90)   . Personal history of other diseases of digestive system   . Personal history of urinary calculi   . Osteoporosis, unspecified   . DJD (degenerative joint disease)   . HTN (hypertension)   . Hyperlipidemia   . Anxiety state, unspecified   . Diabetes mellitus without complication     Medications:  Prescriptions prior to admission  Medication Sig Dispense Refill  . acetaminophen (TYLENOL) 500 MG tablet Take 1,000 mg by mouth every 6 (six) hours as needed for pain.      Marland Kitchen albuterol (PROAIR HFA) 108 (90 BASE) MCG/ACT inhaler Inhale 2 puffs into the lungs every 4 (four) hours as needed for wheezing or shortness of breath.  8.5 g  prn  . ALPRAZolam (XANAX) 0.25 MG tablet Take 1 tablet (0.25 mg total) by mouth at bedtime as needed.  90 tablet  0  . alum & mag hydroxide-simeth (MAALOX/MYLANTA) 200-200-20 MG/5ML  suspension Take 15 mLs by mouth every 4 (four) hours as needed.  355 mL  0  . cetirizine (ZYRTEC) 10 MG tablet Take 10 mg by mouth at bedtime.      . ciprofloxacin (CIPRO) 500 MG tablet Take 1 tablet (500 mg total) by mouth 2 (two) times daily.      . meclizine (ANTIVERT) 25 MG tablet Take 25-50 mg by mouth 3 (three) times daily as needed for dizziness.      . metoprolol (LOPRESSOR) 100 MG tablet Take 1 tablet (100 mg total) by mouth 2 (two) times daily.  60 tablet  0  . pantoprazole (PROTONIX) 40 MG tablet Take 1 tablet (40 mg total) by mouth daily.      . polyethylene glycol (MIRALAX / GLYCOLAX) packet Take 17 g by mouth daily.  14 each  0  . promethazine (PHENERGAN) 12.5 MG tablet Take 12.5-25 mg by mouth at bedtime as needed for nausea (dizziness).      . raloxifene (EVISTA) 60 MG tablet Take 60 mg by mouth daily.        . traMADol (ULTRAM) 50 MG tablet Take 1 tablet (50 mg total) by mouth every 6 (six) hours as needed for pain.  30 tablet  0  . verapamil (VERELAN PM) 240 MG 24 hr capsule Take 240 mg by mouth daily.        Marland Kitchen warfarin (COUMADIN) 2.5 MG  tablet Take 1.25-2.5 mg by mouth daily. 0.5 tab on Sun, Tues, and Thurs; 1 tab all other days.        Assessment: 87yof admitted with lower extremity edema and SOB to continue chronic Coumadin for Afib. Admit INR (2.03) is therapeutic. Of note, CTA reported no PE. Per Coumadin Clinic notes and Med Rec, patient's PTA regimen is 2.5mg  daily except 1.25mg  on Tue/Thur/Sun - last dose given on 6/7. INR is at low end of range - will give Coumadin 2mg  tonight and restart PTA regimen tomorrow.  - H/H and Plts wnl - No significant bleeding reported  Goal of Therapy:  INR 2-3 Monitor platelets by anticoagulation protocol: Yes   Plan:  1. Coumadin 2mg  tonight, then Comadin 2.5mg  daily except 1.25mg  Tue/Thur/Sun 2. Daily INR  Cleon Dew [phone] 01/19/2013,10:13 PM

## 2013-01-19 NOTE — ED Provider Notes (Signed)
History     CSN: [id number]  Arrival date & time 01/19/13  1612   First MD Initiated Contact with Patient 01/19/13 1634      Chief Complaint  Patient presents with  . Leg Swelling    (Consider location/radiation/quality/duration/timing/severity/associated sxs/prior treatment) HPI Comments: 79yo female recently admitted to [facility] with hip fx after fall dc'd to SNF on 6/5 here with progressively worsening BLE swelling that started while she was in the hospital and has progressed. She reports pain with walking. Endorses dyspnea with her therapy which is new for her but no dyspnea at rest. No chest pain, cough or fever.   Patient is a 79 y.o. female presenting with leg pain. The history is provided by the patient.  Leg Pain Location:  Leg Leg location:  R leg and L leg Pain details:    Quality:  Aching (when she walks)   Radiates to:  Does not radiate   Severity:  Mild   Onset quality:  Sudden   Duration:  1 week   Timing:  Constant   Progression:  Worsening Chronicity:  New Relieved by:  Rest Worsened by:  Bearing weight Ineffective treatments:  None tried Associated symptoms: swelling   Associated symptoms: no back pain and no fever     Past Medical History  Diagnosis Date  . Personal history of colonic polyps   . Atrial fibrillation   . Allergic rhinitis, cause unspecified   . Unspecified asthma(493.90)   . Personal history of other diseases of digestive system   . Personal history of urinary calculi   . Osteoporosis, unspecified   . DJD (degenerative joint disease)   . HTN (hypertension)   . Hyperlipidemia   . Anxiety state, unspecified     Past Surgical History  Procedure Laterality Date  . Appendectomy    . Tonsillectomy    . Hemicolectomy      right    Family History  Problem Relation Age of Onset  . Heart disease Mother   . Heart disease Father     History  Substance Use Topics  . Smoking status: Never Smoker   . Smokeless tobacco: Never  Used  . Alcohol Use: No    OB History   Grav Para Term Preterm Abortions TAB SAB Ect Mult Living                  Review of Systems  Constitutional: Negative for fever and chills.  HENT: Negative for congestion, sore throat and rhinorrhea.   Respiratory: Positive for shortness of breath (with exertion). Negative for chest tightness.   Cardiovascular: Positive for leg swelling. Negative for chest pain and palpitations.  Gastrointestinal: Negative for nausea, vomiting, abdominal pain, diarrhea, constipation and rectal pain.  Genitourinary: Negative for dysuria and decreased urine volume.  Musculoskeletal: Negative for back pain.  Neurological: Negative for dizziness, weakness and numbness.  All other systems reviewed and are negative.    Allergies  Pneumococcal vaccines; Atorvastatin; Ciprofloxacin hcl; Morphine sulfate; Penicillins; Prednisone; and Sulfonamide derivatives  Home Medications   Current Outpatient Rx  Name  Route  Sig  Dispense  Refill  . acetaminophen (TYLENOL) 500 MG tablet   Oral   Take 1,000 mg by mouth every 6 (six) hours as needed for pain.         Marland Kitchen albuterol (PROAIR HFA) 108 (90 BASE) MCG/ACT inhaler   Inhalation   Inhale 2 puffs into the lungs every 4 (four) hours as needed for wheezing or shortness  of breath.   8.5 g   prn   . ALPRAZolam (XANAX) 0.25 MG tablet   Oral   Take 1 tablet (0.25 mg total) by mouth at bedtime as needed.   90 tablet   0   . alum & mag hydroxide-simeth (MAALOX/MYLANTA) 200-200-20 MG/5ML suspension   Oral   Take 15 mLs by mouth every 4 (four) hours as needed.   355 mL   0   . cetirizine (ZYRTEC) 10 MG tablet   Oral   Take 10 mg by mouth at bedtime.         . ciprofloxacin (CIPRO) 500 MG tablet   Oral   Take 1 tablet (500 mg total) by mouth 2 (two) times daily.         . meclizine (ANTIVERT) 25 MG tablet   Oral   Take 25-50 mg by mouth 3 (three) times daily as needed for dizziness.         . metoprolol  (LOPRESSOR) 100 MG tablet   Oral   Take 1 tablet (100 mg total) by mouth 2 (two) times daily.   60 tablet   0   . pantoprazole (PROTONIX) 40 MG tablet   Oral   Take 1 tablet (40 mg total) by mouth daily.         . polyethylene glycol (MIRALAX / GLYCOLAX) packet   Oral   Take 17 g by mouth daily.   14 each   0   . promethazine (PHENERGAN) 12.5 MG tablet   Oral   Take 12.5-25 mg by mouth at bedtime as needed for nausea (dizziness).         . raloxifene (EVISTA) 60 MG tablet   Oral   Take 60 mg by mouth daily.           . traMADol (ULTRAM) 50 MG tablet   Oral   Take 1 tablet (50 mg total) by mouth every 6 (six) hours as needed for pain.   30 tablet   0   . verapamil (VERELAN PM) 240 MG 24 hr capsule   Oral   Take 240 mg by mouth daily.           Marland Kitchen warfarin (COUMADIN) 2.5 MG tablet   Oral   Take 1.25-2.5 mg by mouth daily. 0.5 tab on Sun, Tues, and Thurs; 1 tab all other days.           BP 131/87  Pulse 118  Temp(Src) 97.1 F (36.2 C) (Oral)  Resp 27  SpO2 90%  Physical Exam  Nursing note and vitals reviewed. Constitutional: She is oriented to person, place, and time. She appears well-developed and well-nourished. No distress.  HENT:  Head: Normocephalic and atraumatic.  Mouth/Throat: Oropharynx is clear and moist.  Eyes: EOM are normal. Pupils are equal, round, and reactive to light.  Neck: Normal range of motion. Neck supple.  Cardiovascular: Exam reveals no friction rub.   Murmur (II/VI SEM) heard. Tachycardic. Irregularly irregular.   Pulmonary/Chest: Effort normal. No respiratory distress. She has no wheezes. She has rales (mild crackles at bases. ).  Abdominal: Soft. There is no tenderness. There is no rebound and no guarding.  Musculoskeletal: Normal range of motion. She exhibits edema (2+ pitting edema BLEs extending to knees. small area on LLE weeping clear fluid. no erythema, warmth or TTP). She exhibits no tenderness.  Lymphadenopathy:     She has no cervical adenopathy.  Neurological: She is alert and oriented to person, place, and time.  Skin: Skin is warm and dry. No rash noted.  Psychiatric: She has a normal mood and affect. Her behavior is normal.    ED Course  Procedures (including critical care time)  Labs Reviewed  CBC WITH DIFFERENTIAL - Abnormal; Notable for the following:    RDW 17.4 (*)    All other components within normal limits  COMPREHENSIVE METABOLIC PANEL - Abnormal; Notable for the following:    Calcium 8.1 (*)    Total Protein 5.3 (*)    Albumin 2.8 (*)    GFR calc non Af Amer 65 (*)    GFR calc Af Amer 76 (*)    All other components within normal limits  PRO B NATRIURETIC PEPTIDE - Abnormal; Notable for the following:    Pro B Natriuretic peptide (BNP) 4950.0 (*)    All other components within normal limits  PROTIME-INR - Abnormal; Notable for the following:    Prothrombin Time 22.1 (*)    INR 2.03 (*)    All other components within normal limits  POCT I-STAT TROPONIN I   Dg Chest 2 View  01/19/2013   *RADIOLOGY REPORT*  Clinical Data: Lower extremity edema.  History of atrial fibrillation.  CHEST - 2 VIEW  Comparison: 01/11/2013 chest x-ray and thoracic spine MRI on 01/27/2005.  Findings: No overt pulmonary edema is identified.  Heart is moderately enlarged and stable appearance.  There are small bilateral pleural effusions.  Bony thorax shows osteopenia with some loss of height of a mid thoracic vertebral body.  This has been previously demonstrated by MRI of the thoracic spine to represent a chronic compression fracture at the T5 level.  IMPRESSION: Cardiomegaly and small bilateral pleural effusions.  No overt pulmonary edema.   Original Report Authenticated By: Irish Lack, M.D.   Ct Angio Chest Pe W/cm &/or Wo Cm  01/19/2013   *RADIOLOGY REPORT*  Clinical Data: Short of breath with exertion.  No chest pain. Pulmonary embolism.  CT ANGIOGRAPHY CHEST  Technique:  Multidetector CT imaging of the  chest using the standard protocol during bolus administration of intravenous contrast. Multiplanar reconstructed images including MIPs were obtained and reviewed to evaluate the vascular anatomy.  Contrast: 80mL OMNIPAQUE IOHEXOL 350 MG/ML SOLN  Comparison: 01/19/2013 chest radiograph.  Findings: Technically excellent study for evaluation of pulmonary embolus with standing opacification of the pulmonary arteries. Some of this is due to low cardiac output state.  Cardiomegaly is present.  Mitral annular calcification.  Aortic calcification is present. Coronary artery atherosclerosis is present. If office based assessment of coronary risk factors has not been performed, it is now recommended.  The enlargement pulmonary artery may be due to vascular congestion or pulmonary arterial hypertension.  Small dependently layering bilateral pleural effusions are present. There is no adenopathy.  In the aortic atherosclerosis.  No gross acute aortic abnormality.  Borderline 40 mm ascending aortic diameter.  Chronic thoracic compression fractures and exaggerated thoracic kyphosis.  No axillary adenopathy.  No mediastinal or hilar adenopathy. Lungs demonstrate dependent atelectasis.  Scattered areas of scarring are also present.  IMPRESSION:  1.  Technically adequate study.  No pulmonary embolism. 2.  Cardiomegaly and small bilateral pleural effusions compatible with failure. 3.  Borderline aneurysmal dilation of the ascending aorta at 40 mm. No acute aortic abnormality identified allowing for phase of contrast.   Original Report Authenticated By: Andreas Newport, M.D.     1. Dyspnea   2. Acute CHF (congestive heart failure)   3. Acute respiratory failure with hypoxia  4. Anticoagulated on Coumadin   5. Atrial fibrillation with RVR       MDM  45:37 PM 79 year old female history ablation on Coumadin and rate controlled on Lopressor and verapamil recently discharged from [facility] long pelvic fracture presenting now with  worsening bilateral lower tremor he edema. Patient states her edema was present while she was admitted, but has continued to worsen. She denies orthopnea or PND, but endorses dyspnea on exertion with her therapy which is unusual for her. Heart rate is elevated to the low 100s and nausea saturations are in the low 90s on room air. She is in no distress. She has faint bibasilar crackles. Will check CBC, CMP, BNP, troponin, EKG and chest x-ray. Overall swelling is not consistent with saline as. Swelling is symmetric and nontender it is not consistent with DVT.  6:59 PM BNP elevated to over 5000 c/w probable heart failure given dyspnea on exertion and BLE edema. Given lasix 20mg  IV. Noted to gradually increase rate during evaluation to 130s. Given diltiazem bolus and started on drip. Medicine consulted and requested CT/PE study done given recent hospitalization and now with tachycardia and hypoxia which was done and neg for PE. pt admitted for further management.       Caren Hazy, MD 01/19/13 2317

## 2013-01-19 NOTE — ED Notes (Signed)
Pt states that she has been having swelling in both legs x 1 week. Pt states that the swelling got worse today as "it started leaking clear white fluid".

## 2013-01-19 NOTE — ED Provider Notes (Signed)
History     CSN: [id number]  Arrival date & time 01/19/13  1612   First MD Initiated Contact with Patient 01/19/13 1634      Chief Complaint  Patient presents with  . Leg Swelling    (Consider location/radiation/quality/duration/timing/severity/associated sxs/prior treatment) HPI  Pt is an elderly female recnetly with inpt stay related to a fall and hip frx who presents with increased DOE, orthopnea, swelling in the legs and rapid heart rate.  Sx are severe, persistent, and gradually worsening, denies fevers or vomiting. b no hx of CHF.  She does have afib and is on coumadin, verapamil for this.  She endorses taking her medications.  Past Medical History  Diagnosis Date  . Personal history of colonic polyps   . Atrial fibrillation   . Allergic rhinitis, cause unspecified   . Unspecified asthma(493.90)   . Personal history of other diseases of digestive system   . Personal history of urinary calculi   . Osteoporosis, unspecified   . DJD (degenerative joint disease)   . HTN (hypertension)   . Hyperlipidemia   . Anxiety state, unspecified   . Diabetes mellitus without complication     Past Surgical History  Procedure Laterality Date  . Appendectomy    . Tonsillectomy    . Hemicolectomy      right    Family History  Problem Relation Age of Onset  . Heart disease Mother   . Heart disease Father     History  Substance Use Topics  . Smoking status: Never Smoker   . Smokeless tobacco: Never Used  . Alcohol Use: No    OB History   Grav Para Term Preterm Abortions TAB SAB Ect Mult Living                  Review of Systems  All other systems reviewed and are negative.    Allergies  Pneumococcal vaccines; Atorvastatin; Ciprofloxacin hcl; Morphine sulfate; Penicillins; Prednisone; and Sulfonamide derivatives  Home Medications   Current Outpatient Rx  Name  Route  Sig  Dispense  Refill  . acetaminophen (TYLENOL) 500 MG tablet   Oral   Take 1,000 mg by  mouth every 6 (six) hours as needed for pain.         Marland Kitchen albuterol (PROAIR HFA) 108 (90 BASE) MCG/ACT inhaler   Inhalation   Inhale 2 puffs into the lungs every 4 (four) hours as needed for wheezing or shortness of breath.   8.5 g   prn   . ALPRAZolam (XANAX) 0.25 MG tablet   Oral   Take 1 tablet (0.25 mg total) by mouth at bedtime as needed.   90 tablet   0   . alum & mag hydroxide-simeth (MAALOX/MYLANTA) 200-200-20 MG/5ML suspension   Oral   Take 15 mLs by mouth every 4 (four) hours as needed.   355 mL   0   . cetirizine (ZYRTEC) 10 MG tablet   Oral   Take 10 mg by mouth at bedtime.         . ciprofloxacin (CIPRO) 500 MG tablet   Oral   Take 1 tablet (500 mg total) by mouth 2 (two) times daily.         . meclizine (ANTIVERT) 25 MG tablet   Oral   Take 25-50 mg by mouth 3 (three) times daily as needed for dizziness.         . metoprolol (LOPRESSOR) 100 MG tablet   Oral   Take 1  tablet (100 mg total) by mouth 2 (two) times daily.   60 tablet   0   . pantoprazole (PROTONIX) 40 MG tablet   Oral   Take 1 tablet (40 mg total) by mouth daily.         . polyethylene glycol (MIRALAX / GLYCOLAX) packet   Oral   Take 17 g by mouth daily.   14 each   0   . promethazine (PHENERGAN) 12.5 MG tablet   Oral   Take 12.5-25 mg by mouth at bedtime as needed for nausea (dizziness).         . raloxifene (EVISTA) 60 MG tablet   Oral   Take 60 mg by mouth daily.           . traMADol (ULTRAM) 50 MG tablet   Oral   Take 1 tablet (50 mg total) by mouth every 6 (six) hours as needed for pain.   30 tablet   0   . verapamil (VERELAN PM) 240 MG 24 hr capsule   Oral   Take 240 mg by mouth daily.           Marland Kitchen warfarin (COUMADIN) 2.5 MG tablet   Oral   Take 1.25-2.5 mg by mouth daily. 0.5 tab on Sun, Tues, and Thurs; 1 tab all other days.           BP 127/67  Pulse 133  Temp(Src) 97.1 F (36.2 C) (Oral)  Resp 29  SpO2 97%  Physical Exam  Constitutional:  She appears well-developed and well-nourished.  Mildly anxious and uncomfortable appearing  HENT:  Head: Normocephalic and atraumatic.  Eyes: Conjunctivae are normal. No scleral icterus.  Neck: Normal range of motion. Neck supple. No thyromegaly present.  Cardiovascular:  Extremely tachycardic with afib with rvr pulse in the 140's but variable.  Strong peripheral pulses bialteral radial arteries, no JVD.  Pulmonary/Chest:  Mild tachypnea, slight rales, no wheezing, speaks in full sentences.  Abdominal: Soft. There is no tenderness.  Musculoskeletal: Normal range of motion. She exhibits edema ( severe bilateral 2+ pitting edema with some weeping of clear fluid.  no erythema).  Neurological: She is alert. Coordination normal.  Skin: Skin is warm and dry.    ED Course  Procedures (including critical care time)  Labs Reviewed  CBC WITH DIFFERENTIAL - Abnormal; Notable for the following:    RDW 17.4 (*)    All other components within normal limits  COMPREHENSIVE METABOLIC PANEL - Abnormal; Notable for the following:    Calcium 8.1 (*)    Total Protein 5.3 (*)    Albumin 2.8 (*)    GFR calc non Af Amer 65 (*)    GFR calc Af Amer 76 (*)    All other components within normal limits  PRO B NATRIURETIC PEPTIDE - Abnormal; Notable for the following:    Pro B Natriuretic peptide (BNP) 4950.0 (*)    All other components within normal limits  PROTIME-INR - Abnormal; Notable for the following:    Prothrombin Time 22.1 (*)    INR 2.03 (*)    All other components within normal limits  POCT I-STAT TROPONIN I   Dg Chest 2 View  01/19/2013   *RADIOLOGY REPORT*  Clinical Data: Lower extremity edema.  History of atrial fibrillation.  CHEST - 2 VIEW  Comparison: 01/11/2013 chest x-ray and thoracic spine MRI on 01/27/2005.  Findings: No overt pulmonary edema is identified.  Heart is moderately enlarged and stable appearance.  There are small  bilateral pleural effusions.  Bony thorax shows osteopenia  with some loss of height of a mid thoracic vertebral body.  This has been previously demonstrated by MRI of the thoracic spine to represent a chronic compression fracture at the T5 level.  IMPRESSION: Cardiomegaly and small bilateral pleural effusions.  No overt pulmonary edema.   Original Report Authenticated By: Irish Lack, M.D.   Ct Angio Chest Pe W/cm &/or Wo Cm  01/19/2013   *RADIOLOGY REPORT*  Clinical Data: Short of breath with exertion.  No chest pain. Pulmonary embolism.  CT ANGIOGRAPHY CHEST  Technique:  Multidetector CT imaging of the chest using the standard protocol during bolus administration of intravenous contrast. Multiplanar reconstructed images including MIPs were obtained and reviewed to evaluate the vascular anatomy.  Contrast: 80mL OMNIPAQUE IOHEXOL 350 MG/ML SOLN  Comparison: 01/19/2013 chest radiograph.  Findings: Technically excellent study for evaluation of pulmonary embolus with standing opacification of the pulmonary arteries. Some of this is due to low cardiac output state.  Cardiomegaly is present.  Mitral annular calcification.  Aortic calcification is present. Coronary artery atherosclerosis is present. If office based assessment of coronary risk factors has not been performed, it is now recommended.  The enlargement pulmonary artery may be due to vascular congestion or pulmonary arterial hypertension.  Small dependently layering bilateral pleural effusions are present. There is no adenopathy.  In the aortic atherosclerosis.  No gross acute aortic abnormality.  Borderline 40 mm ascending aortic diameter.  Chronic thoracic compression fractures and exaggerated thoracic kyphosis.  No axillary adenopathy.  No mediastinal or hilar adenopathy. Lungs demonstrate dependent atelectasis.  Scattered areas of scarring are also present.  IMPRESSION:  1.  Technically adequate study.  No pulmonary embolism. 2.  Cardiomegaly and small bilateral pleural effusions compatible with failure. 3.   Borderline aneurysmal dilation of the ascending aorta at 40 mm. No acute aortic abnormality identified allowing for phase of contrast.   Original Report Authenticated By: Andreas Newport, M.D.     1. Dyspnea   2. Acute CHF (congestive heart failure)   3. Acute respiratory failure with hypoxia   4. Anticoagulated on Coumadin   5. Atrial fibrillation with RVR       MDM  I have seen and agree with the ECG interpretation as documented by the resident - Dr. Fayrene Fearing - afib with rvr.  The pt is constantly in this rhythm and has been after getting IV cardizem bolus, is requiring a drip.  Her BNP is elevated which in the setting of progressive new peripheral edema is likely related to new onset Acute CHF -   CXR - revleaed cardiomeglay but no pulmonary edema and  A CT angiogram of the chest revewaled no puomonary embolisms.    The pt has been persistently tachycardic, this may be the stressor that has brought on the CHF - aggressive rate control is needed, pt has been reexamined multiple times and is requiring ongoing therapy and will need high level of care, multiple days admission is anticipated - consulted to specialists - hospitalist to admit.  CC is being delivered.  CRITICAL CARE Performed by: Vida Roller Total critical care time: 35 Critical care time was exclusive of separately billable procedures and treating other patients. Critical care was necessary to treat or prevent imminent or life-threatening deterioration. Critical care was time spent personally by me on the following activities: development of treatment plan with patient and/or surrogate as well as nursing, discussions with consultants, evaluation of patient's response to treatment, examination of  patient, obtaining history from patient or surrogate, ordering and performing treatments and interventions, ordering and review of laboratory studies, ordering and review of radiographic studies, pulse oximetry and re-evaluation of  patient's condition.         Vida Roller, MD 01/19/13 2055

## 2013-01-20 ENCOUNTER — Encounter (HOSPITAL_COMMUNITY): Payer: Self-pay | Admitting: Cardiology

## 2013-01-20 DIAGNOSIS — S329XXA Fracture of unspecified parts of lumbosacral spine and pelvis, initial encounter for closed fracture: Secondary | ICD-10-CM

## 2013-01-20 DIAGNOSIS — N390 Urinary tract infection, site not specified: Secondary | ICD-10-CM | POA: Insufficient documentation

## 2013-01-20 DIAGNOSIS — R600 Localized edema: Secondary | ICD-10-CM | POA: Diagnosis present

## 2013-01-20 DIAGNOSIS — I38 Endocarditis, valve unspecified: Secondary | ICD-10-CM | POA: Diagnosis present

## 2013-01-20 DIAGNOSIS — J9600 Acute respiratory failure, unspecified whether with hypoxia or hypercapnia: Secondary | ICD-10-CM

## 2013-01-20 DIAGNOSIS — I340 Nonrheumatic mitral (valve) insufficiency: Secondary | ICD-10-CM

## 2013-01-20 DIAGNOSIS — I509 Heart failure, unspecified: Secondary | ICD-10-CM

## 2013-01-20 DIAGNOSIS — N183 Chronic kidney disease, stage 3 unspecified: Secondary | ICD-10-CM | POA: Diagnosis present

## 2013-01-20 DIAGNOSIS — R0609 Other forms of dyspnea: Secondary | ICD-10-CM

## 2013-01-20 DIAGNOSIS — I059 Rheumatic mitral valve disease, unspecified: Secondary | ICD-10-CM

## 2013-01-20 DIAGNOSIS — I4891 Unspecified atrial fibrillation: Secondary | ICD-10-CM

## 2013-01-20 DIAGNOSIS — R609 Edema, unspecified: Secondary | ICD-10-CM

## 2013-01-20 DIAGNOSIS — Z7901 Long term (current) use of anticoagulants: Secondary | ICD-10-CM

## 2013-01-20 DIAGNOSIS — Z5181 Encounter for therapeutic drug level monitoring: Secondary | ICD-10-CM

## 2013-01-20 DIAGNOSIS — I369 Nonrheumatic tricuspid valve disorder, unspecified: Secondary | ICD-10-CM

## 2013-01-20 HISTORY — DX: Nonrheumatic mitral (valve) insufficiency: I34.0

## 2013-01-20 HISTORY — DX: Fracture of unspecified parts of lumbosacral spine and pelvis, initial encounter for closed fracture: S32.9XXA

## 2013-01-20 LAB — BASIC METABOLIC PANEL
Calcium: 8.8 mg/dL (ref 8.4–10.5)
Creatinine, Ser: 0.76 mg/dL (ref 0.50–1.10)
GFR calc Af Amer: 87 mL/min — ABNORMAL LOW (ref >90)
GFR calc non Af Amer: 75 mL/min — ABNORMAL LOW (ref >90)
Sodium: 137 mEq/L (ref 135–145)

## 2013-01-20 LAB — CBC
HCT: 39 % (ref 36.0–46.0)
Hemoglobin: 12.6 g/dL (ref 12.0–15.0)
MCH: 29.4 pg (ref 26.0–34.0)
MCHC: 32.3 g/dL (ref 30.0–36.0)
MCV: 90.9 fL (ref 78.0–100.0)
WBC: 8.3 10*3/uL (ref 4.0–10.5)

## 2013-01-20 LAB — PROTIME-INR: INR: 1.9 — ABNORMAL HIGH (ref 0.00–1.49)

## 2013-01-20 LAB — TROPONIN I: Troponin I: <0.3 ng/mL (ref <0.30)

## 2013-01-20 MED ORDER — DILTIAZEM HCL 100 MG IV SOLR: 5.0000 mg/h | INTRAVENOUS | Status: DC

## 2013-01-20 MED ORDER — FUROSEMIDE 10 MG/ML IJ SOLN
40.0000 mg | Freq: Three times a day (TID) | INTRAMUSCULAR | Status: DC
  Administered 2013-01-20 – 2013-01-24 (×11): 40 mg via INTRAVENOUS
  Filled 2013-01-20 (×13): qty 4

## 2013-01-20 MED ORDER — HEPARIN (PORCINE) IN NACL 100-0.45 UNIT/ML-% IJ SOLN
1050.0000 [IU]/h | INTRAMUSCULAR | Status: DC
  Administered 2013-01-20: 1050 [IU]/h via INTRAVENOUS
  Filled 2013-01-20 (×2): qty 250

## 2013-01-20 MED ORDER — VERAPAMIL HCL ER 240 MG PO TBCR
240.0000 mg | EXTENDED_RELEASE_TABLET | Freq: Every day | ORAL | Status: DC
  Administered 2013-01-20 – 2013-01-26 (×7): 240 mg via ORAL
  Filled 2013-01-20 (×9): qty 1

## 2013-01-20 MED ORDER — POTASSIUM CHLORIDE CRYS ER 20 MEQ PO TBCR
20.0000 meq | EXTENDED_RELEASE_TABLET | Freq: Two times a day (BID) | ORAL | Status: AC
  Administered 2013-01-20 – 2013-01-21 (×3): 20 meq via ORAL
  Filled 2013-01-20 (×4): qty 1

## 2013-01-20 MED ORDER — WARFARIN SODIUM 2.5 MG PO TABS: 2.5000 mg | ORAL_TABLET | ORAL | Status: DC

## 2013-01-20 MED ORDER — WARFARIN SODIUM 4 MG PO TABS
4.0000 mg | ORAL_TABLET | Freq: Once | ORAL | Status: AC
  Administered 2013-01-20: 4 mg via ORAL
  Filled 2013-01-20: qty 1

## 2013-01-20 NOTE — Progress Notes (Signed)
Clinical Social Work Department BRIEF PSYCHOSOCIAL ASSESSMENT 01/20/2013  Patient:  Joanna Salas, Joanna Salas     Account Number:  [id number]     Admit date:  01/19/2013  Clinical Social Worker:  Margaree Mackintosh  Date/Time:  01/20/2013 03:45 PM  Referred by:  Care Management  Date Referred:  01/20/2013 Referred for  SNF Placement   Other Referral:   Pt admitted from [facility].   Interview type:  Patient Other interview type:   With dtr and step dtr present.    PSYCHOSOCIAL DATA Living Status:  FACILITY Admitted from facility:  [facility] Level of care:  Skilled Nursing Facility Primary support name:  [name]: [phone] Primary support relationship to patient:  CHILD, ADULT Degree of support available:   Adequate.    CURRENT CONCERNS Current Concerns  Post-Acute Placement   Other Concerns:    SOCIAL WORK ASSESSMENT / PLAN Clinical Social Worker received referral indicating pt is from [facility].  CSW reviewed chart and met with pt at bedside.  CSW introduced self, explained role, and provided support.  Pt and family confirmed plan for pt to return to [facility] and are agreeable to clinical information being sent to facility.  CSW to continue to follow and assist as needed.   Assessment/plan status:  Information/Referral to [facility] Other assessment/ plan:   Information/referral to community resources:   SNF    PATIENT'S/FAMILY'S RESPONSE TO PLAN OF CARE: Pt and family were pleasant and engaged in conversation. Pt and family thanked CSW for intervention.       Angelia Mould, [place] [phone]

## 2013-01-20 NOTE — Progress Notes (Addendum)
ANTICOAGULATION CONSULT NOTE - Initial Consult  Pharmacy Consult for Heparin/Coumadin Indication: atrial fibrillation  Allergies  Allergen Reactions  . Pneumococcal Vaccines   . Atorvastatin Other (See Comments)    Severe Leg Cramps  . Ciprofloxacin Hcl Nausea And Vomiting  . Morphine Sulfate Nausea Only  . Penicillins     REACTION: unspecified  . Prednisone     unknown  . Sotalol Hcl   . Sulfonamide Derivatives Nausea Only    Patient Measurements: Height: 5\' 6"  (167.6 cm) Weight: 179 lb 14.3 oz (81.6 kg) IBW/kg (Calculated) : 59.3 Heparin Dosing Weight: 77 kg  Vital Signs: Temp: 97.6 F (36.4 C) (06/09 1612) Temp src: Oral (06/09 1612) BP: 114/60 mmHg (06/09 1757) Pulse Rate: 47 (06/09 1700)  Labs:  Recent Labs  01/19/13 1709 01/19/13 2201 01/20/13 0250 01/20/13 0820 01/20/13 1705  HGB 12.3  --  12.6  --   --   HCT 37.5  --  39.0  --   --   PLT 185  --  203  --   --   LABPROT 22.1*  --  21.1*  --   --   INR 2.03*  --  1.90*  --   --   HEPARINUNFRC  --   --   --   --  0.46  CREATININE 0.80  --  0.76  --   --   TROPONINI  --  <0.30 <0.30 <0.30  --     Estimated Creatinine Clearance: 55.4 ml/min (by C-G formula based on Cr of 0.76).   Medical History: Past Medical History  Diagnosis Date  . Personal history of colonic polyps   . Allergic rhinitis, cause unspecified   . Unspecified asthma(493.90)   . Personal history of other diseases of digestive system   . Personal history of urinary calculi   . Osteoporosis, unspecified   . DJD (degenerative joint disease)   . HTN (hypertension)   . Hyperlipidemia   . Anxiety state, unspecified   . Diabetes mellitus without complication   . Atrial fibrillation, permanent     Hx. of failed DCCV  . Mitral valve regurgitation, mod. on TEE 2004, & mild to mod TR, EF 60%. 01/20/2013  . Pelvic fracture, recent history of 01/20/2013    Assessment: 87yof admitted with lower extremity edema and SOB.  On Coumadin PTA for  A-Fib -in RVR on admit improved now on Diltiazem drip.   INR 1.9 this morning - fell from admit INR 2.03 despite Coumadin 2mg  6/8, CTA reported no PE. Pro-BNP elevated, ECHO is unchanged and no plans for cath. Pharmacy is consulted to start IV heparin until INR > 2 Heparin drip 1050 units/hr HL 0.41 at goal no bleeding noted.   Goal of Therapy:  INR = 2-3 Heparin level 0.3-0.7 units/ml Monitor platelets by anticoagulation protocol: Yes   Plan:  - Continue Heparin 1050 units/hr  - Coumadin 4mg  x1  Daily Heparin level, CBC, protime  Leota Sauers Pharm.D. CPP, BCPS Clinical Pharmacist [phone] 01/20/2013 7:30 PM

## 2013-01-20 NOTE — Progress Notes (Signed)
Echocardiogram 2D Echocardiogram has been performed.  Joanna Salas 01/20/2013, 12:06 PM

## 2013-01-20 NOTE — Consult Note (Signed)
Reason for Consult: New CHF, Permanent A fib now with RVR   Referring Physician: Dr. Elisabeth Pigeon   Default, Provider, MD Primary Cardiologist:Dr. Akylah Hascall is an 79 y.o. female.    Chief Complaint: Admitted 6.8.14 with SOB and bilateral lower ext. Edema.   HPI: 79 year old recently with a female patient who normally sees Dr. Allyson Sabal for cardiology, was admitted 01/19/2013 with shortness of breath and bilateral lower extremity edema. Previously she has a history of permanent chronic atrial fibrillation on Coumadin for anticoagulation (she has had unsuccessful cardioversion in the past). Other history includes hypertension hyperlipidemia her last Myoview was 2007 low-risk study.  Patient was recently hospitalized at [facility] long after a pelvic fracture from fall at home. During the hospitalization she developed acute renal failure. She had been discharged to skilled nursing facility. She presented back to the emergency room 01/19/2013 secondary for lower extremity edema dyspnea on exertion those limiting her physical therapy at skilled nursing facility. In the emergency room her Pro BNP was 5000 there were chest x-ray did not show acute pulmonary edema. She was hypoxic at 90% on 2 L her permanent atrial fibrillation female had rapid ventricular response with heart rates around 140.  IV Cardizem was started, 2-D echocardiogram was ordered.  Patient's troponin's are negative x2.  Renal function is stable with creatinine 0.76 this morning BUN of 13. She denies any chest pain. Swelling began at Nursing home.  No prior history of edema or HF.  No Hx. Of Cardiac cath.  EKG on admission atrial fibrillation with rapid ventricular response no acute EKG changes, otherwise this morning 01/20/2013 EKG atrial fibrillation rate of 92 again no acute EKG changes chest a controlled ventricular rate  I & O  -1105, wt.  Not yet done for today.    Past Medical History  Diagnosis Date  . Personal history  of colonic polyps   . Allergic rhinitis, cause unspecified   . Unspecified asthma(493.90)   . Personal history of other diseases of digestive system   . Personal history of urinary calculi   . Osteoporosis, unspecified   . DJD (degenerative joint disease)   . HTN (hypertension)   . Hyperlipidemia   . Anxiety state, unspecified   . Diabetes mellitus without complication   . Atrial fibrillation, permanent     Hx. of failed DCCV  . Mitral valve regurgitation, mod. on TEE 2004, & mild to mod TR, EF 60%. 01/20/2013    Past Surgical History  Procedure Laterality Date  . Appendectomy    . Tonsillectomy    . Hemicolectomy      right    Family History  Problem Relation Age of Onset  . Heart disease Mother   . Heart disease Father    Social History:  reports that she has never smoked. She has never used smokeless tobacco. She reports that she does not drink alcohol or use illicit drugs.  She became a widow in March of this year, she has one living child one grandchild and one stepgrandchild.   Allergies:  Allergies  Allergen Reactions  . Pneumococcal Vaccines   . Atorvastatin Other (See Comments)    Severe Leg Cramps  . Ciprofloxacin Hcl Nausea And Vomiting  . Morphine Sulfate Nausea Only  . Penicillins     REACTION: unspecified  . Prednisone     unknown  . Sotalol Hcl   . Sulfonamide Derivatives Nausea Only    Medications Prior to Admission  Medication Sig  Dispense Refill  . acetaminophen (TYLENOL) 500 MG tablet Take 1,000 mg by mouth every 6 (six) hours as needed for pain.      Marland Kitchen albuterol (PROAIR HFA) 108 (90 BASE) MCG/ACT inhaler Inhale 2 puffs into the lungs every 4 (four) hours as needed for wheezing or shortness of breath.  8.5 g  prn  . ALPRAZolam (XANAX) 0.25 MG tablet Take 1 tablet (0.25 mg total) by mouth at bedtime as needed.  90 tablet  0  . alum & mag hydroxide-simeth (MAALOX/MYLANTA) 200-200-20 MG/5ML suspension Take 15 mLs by mouth every 4 (four) hours as  needed.  355 mL  0  . cetirizine (ZYRTEC) 10 MG tablet Take 10 mg by mouth at bedtime.      . ciprofloxacin (CIPRO) 500 MG tablet Take 1 tablet (500 mg total) by mouth 2 (two) times daily.      . meclizine (ANTIVERT) 25 MG tablet Take 25-50 mg by mouth 3 (three) times daily as needed for dizziness.      . metoprolol (LOPRESSOR) 100 MG tablet Take 1 tablet (100 mg total) by mouth 2 (two) times daily.  60 tablet  0  . pantoprazole (PROTONIX) 40 MG tablet Take 1 tablet (40 mg total) by mouth daily.      . polyethylene glycol (MIRALAX / GLYCOLAX) packet Take 17 g by mouth daily.  14 each  0  . promethazine (PHENERGAN) 12.5 MG tablet Take 12.5-25 mg by mouth at bedtime as needed for nausea (dizziness).      . raloxifene (EVISTA) 60 MG tablet Take 60 mg by mouth daily.        . traMADol (ULTRAM) 50 MG tablet Take 1 tablet (50 mg total) by mouth every 6 (six) hours as needed for pain.  30 tablet  0  . verapamil (VERELAN PM) 240 MG 24 hr capsule Take 240 mg by mouth daily.        Marland Kitchen warfarin (COUMADIN) 2.5 MG tablet Take 1.25-2.5 mg by mouth daily. 0.5 tab on Sun, Tues, and Thurs; 1 tab all other days.        Results for orders placed during the hospital encounter of 01/19/13 (from the past 48 hour(s))  CBC WITH DIFFERENTIAL     Status: Abnormal   Collection Time    01/19/13  5:09 PM      Result Value Range   WBC 8.0  4.0 - 10.5 K/uL   RBC 4.11  3.87 - 5.11 MIL/uL   Hemoglobin 12.3  12.0 - 15.0 g/dL   HCT 78.2  95.6 - 21.3 %   MCV 91.2  78.0 - 100.0 fL   MCH 29.9  26.0 - 34.0 pg   MCHC 32.8  30.0 - 36.0 g/dL   RDW 08.6 (*) 57.8 - 46.9 %   Platelets 185  150 - 400 K/uL   Neutrophils Relative % 73  43 - 77 %   Neutro Abs 5.8  1.7 - 7.7 K/uL   Lymphocytes Relative 13  12 - 46 %   Lymphs Abs 1.1  0.7 - 4.0 K/uL   Monocytes Relative 12  3 - 12 %   Monocytes Absolute 1.0  0.1 - 1.0 K/uL   Eosinophils Relative 1  0 - 5 %   Eosinophils Absolute 0.1  0.0 - 0.7 K/uL   Basophils Relative 0  0 - 1 %     Basophils Absolute 0.0  0.0 - 0.1 K/uL  COMPREHENSIVE METABOLIC PANEL     Status: Abnormal  Collection Time    01/19/13  5:09 PM      Result Value Range   Sodium 139  135 - 145 mEq/L   Potassium 3.8  3.5 - 5.1 mEq/L   Chloride 103  96 - 112 mEq/L   CO2 29  19 - 32 mEq/L   Glucose, Bld 96  70 - 99 mg/dL   BUN 15  6 - 23 mg/dL   Creatinine, Ser 8.65  0.50 - 1.10 mg/dL   Calcium 8.1 (*) 8.4 - 10.5 mg/dL   Total Protein 5.3 (*) 6.0 - 8.3 g/dL   Albumin 2.8 (*) 3.5 - 5.2 g/dL   AST 25  0 - 37 U/L   ALT 22  0 - 35 U/L   Alkaline Phosphatase 78  39 - 117 U/L   Total Bilirubin 1.0  0.3 - 1.2 mg/dL   GFR calc non Af Amer 65 (*) >90 mL/min   GFR calc Af Amer 76 (*) >90 mL/min   Comment:            The eGFR has been calculated     using the CKD EPI equation.     This calculation has not been     validated in all clinical     situations.     eGFR's persistently     <90 mL/min signify     possible Chronic Kidney Disease.  PRO B NATRIURETIC PEPTIDE     Status: Abnormal   Collection Time    01/19/13  5:09 PM      Result Value Range   Pro B Natriuretic peptide (BNP) 4950.0 (*) 0 - 450 pg/mL  PROTIME-INR     Status: Abnormal   Collection Time    01/19/13  5:09 PM      Result Value Range   Prothrombin Time 22.1 (*) 11.6 - 15.2 seconds   INR 2.03 (*) 0.00 - 1.49  POCT I-STAT TROPONIN I     Status: None   Collection Time    01/19/13  5:23 PM      Result Value Range   Troponin i, poc 0.04  0.00 - 0.08 ng/mL   Comment 3            Comment: Due to the release kinetics of cTnI,     a negative result within the first hours     of the onset of symptoms does not rule out     myocardial infarction with certainty.     If myocardial infarction is still suspected,     repeat the test at appropriate intervals.  MRSA PCR SCREENING     Status: None   Collection Time    01/19/13  9:21 PM      Result Value Range   MRSA by PCR NEGATIVE  NEGATIVE   Comment:            The GeneXpert MRSA  Assay (FDA     approved for NASAL specimens     only), is one component of a     comprehensive MRSA colonization     surveillance program. It is not     intended to diagnose MRSA     infection nor to guide or     monitor treatment for     MRSA infections.  MAGNESIUM     Status: None   Collection Time    01/19/13 10:01 PM      Result Value Range   Magnesium 1.8  1.5 - 2.5 mg/dL  TROPONIN I     Status: None   Collection Time    01/19/13 10:01 PM      Result Value Range   Troponin I <0.30  <0.30 ng/mL   Comment:            Due to the release kinetics of cTnI,     a negative result within the first hours     of the onset of symptoms does not rule out     myocardial infarction with certainty.     If myocardial infarction is still suspected,     repeat the test at appropriate intervals.  TROPONIN I     Status: None   Collection Time    01/20/13  2:50 AM      Result Value Range   Troponin I <0.30  <0.30 ng/mL   Comment:            Due to the release kinetics of cTnI,     a negative result within the first hours     of the onset of symptoms does not rule out     myocardial infarction with certainty.     If myocardial infarction is still suspected,     repeat the test at appropriate intervals.  BASIC METABOLIC PANEL     Status: Abnormal   Collection Time    01/20/13  2:50 AM      Result Value Range   Sodium 137  135 - 145 mEq/L   Potassium 3.5  3.5 - 5.1 mEq/L   Chloride 99  96 - 112 mEq/L   CO2 33 (*) 19 - 32 mEq/L   Glucose, Bld 96  70 - 99 mg/dL   BUN 13  6 - 23 mg/dL   Creatinine, Ser 9.56  0.50 - 1.10 mg/dL   Calcium 8.8  8.4 - 21.3 mg/dL   GFR calc non Af Amer 75 (*) >90 mL/min   GFR calc Af Amer 87 (*) >90 mL/min   Comment:            The eGFR has been calculated     using the CKD EPI equation.     This calculation has not been     validated in all clinical     situations.     eGFR's persistently     <90 mL/min signify     possible Chronic Kidney Disease.   CBC     Status: Abnormal   Collection Time    01/20/13  2:50 AM      Result Value Range   WBC 8.3  4.0 - 10.5 K/uL   RBC 4.29  3.87 - 5.11 MIL/uL   Hemoglobin 12.6  12.0 - 15.0 g/dL   HCT 08.6  57.8 - 46.9 %   MCV 90.9  78.0 - 100.0 fL   MCH 29.4  26.0 - 34.0 pg   MCHC 32.3  30.0 - 36.0 g/dL   RDW 62.9 (*) 52.8 - 41.3 %   Platelets 203  150 - 400 K/uL  PROTIME-INR     Status: Abnormal   Collection Time    01/20/13  2:50 AM      Result Value Range   Prothrombin Time 21.1 (*) 11.6 - 15.2 seconds   INR 1.90 (*) 0.00 - 1.49   Dg Chest 2 View  01/19/2013   *RADIOLOGY REPORT*  Clinical Data: Lower extremity edema.  History of atrial fibrillation.  CHEST - 2 VIEW  Comparison: 01/11/2013 chest x-ray and thoracic  spine MRI on 01/27/2005.  Findings: No overt pulmonary edema is identified.  Heart is moderately enlarged and stable appearance.  There are small bilateral pleural effusions.  Bony thorax shows osteopenia with some loss of height of a mid thoracic vertebral body.  This has been previously demonstrated by MRI of the thoracic spine to represent a chronic compression fracture at the T5 level.  IMPRESSION: Cardiomegaly and small bilateral pleural effusions.  No overt pulmonary edema.   Original Report Authenticated By: Irish Lack, M.D.   Ct Angio Chest Pe W/cm &/or Wo Cm  01/19/2013   *RADIOLOGY REPORT*  Clinical Data: Short of breath with exertion.  No chest pain. Pulmonary embolism.  CT ANGIOGRAPHY CHEST  Technique:  Multidetector CT imaging of the chest using the standard protocol during bolus administration of intravenous contrast. Multiplanar reconstructed images including MIPs were obtained and reviewed to evaluate the vascular anatomy.  Contrast: 80mL OMNIPAQUE IOHEXOL 350 MG/ML SOLN  Comparison: 01/19/2013 chest radiograph.  Findings: Technically excellent study for evaluation of pulmonary embolus with standing opacification of the pulmonary arteries. Some of this is due to low  cardiac output state.  Cardiomegaly is present.  Mitral annular calcification.  Aortic calcification is present. Coronary artery atherosclerosis is present. If office based assessment of coronary risk factors has not been performed, it is now recommended.  The enlargement pulmonary artery may be due to vascular congestion or pulmonary arterial hypertension.  Small dependently layering bilateral pleural effusions are present. There is no adenopathy.  In the aortic atherosclerosis.  No gross acute aortic abnormality.  Borderline 40 mm ascending aortic diameter.  Chronic thoracic compression fractures and exaggerated thoracic kyphosis.  No axillary adenopathy.  No mediastinal or hilar adenopathy. Lungs demonstrate dependent atelectasis.  Scattered areas of scarring are also present.  IMPRESSION:  1.  Technically adequate study.  No pulmonary embolism. 2.  Cardiomegaly and small bilateral pleural effusions compatible with failure. 3.  Borderline aneurysmal dilation of the ascending aorta at 40 mm. No acute aortic abnormality identified allowing for phase of contrast.   Original Report Authenticated By: Andreas Newport, M.D.    ROS: General:no colds or fevers, increased weight with edema Skin:no rashes or ulcers, bruising on Lt arm. From IV sticks during previous hospitalizations.  HEENT:no blurred vision, no congestion CV:see HPI PUL:see HPI GI:no diarrhea constipation or melena, no indigestion- since previous hospitalization. GU:no hematuria, no dysuria MS:no joint pain, no claudication Neuro:no syncope, no lightheadedness Endo:no diabetes, no thyroid disease  Psych:  Continued situational depression since husband's death.  She stated her room mate at [facility]sg[facility] Home has been helpful from spiritual standpoint.  Blood pressure 141/75, pulse 110, temperature 97.3 F (36.3 C), temperature source Oral, resp. rate 22, height 5\' 6"  (1.676 m), weight 179 lb 14.3 oz (81.6 kg), SpO2 96.00%. PE: General:Pleasant  affect, though somewhat flat. NAD Skin:Warm and dry, brisk capillary refill, bruising Lt. Arm. HEENT:normocephalic, sclera clear, mucus membranes moist Neck:supple, no JVD, no bruits  Heart:irreg irreg without murmur, gallup, rub or click Lungs:clear without rales, rhonchi, or wheezes ONG:EXBM, non tender, + BS, do not palpate liver spleen or masses Ext:2-3+ lower ext edema-bilateral, difficult to assess due to edema pedal pulses, 2+ radial pulses Neuro:alert and oriented, MAE, follows commands, + facial symmetry    Assessment/Plan Principal Problem:   Acute respiratory failure with hypoxia Active Problems:   HYPERTENSION   Atrial fibrillation, permanent   Long term (current) use of anticoagulants   Acute CHF (congestive heart failure)   Atrial fibrillation  with RVR   Anticoagulated on Coumadin   Mitral valve regurgitation, mod. on TEE 2004, & mild to mod TR, EF 60%.  PLAN: 87 yr. Old female with permanent atrial fibrillation is now admitted with acute congestive heart failure respiratory failure with hypoxia. Was in nursing Home secondary to fall and fractured pelvis.  She's had significant lower extremity edema that is slowly improving and her permanent atrial fibrillation was rapid on admission.  Rate has improved with IV Cardizem. Continues at 5 mg. Previously she has been on verapamil as an outpatient, have stopped until MD evaluates.  Allergy to sotalolol.  Cardiac enzymes are negative. INR is slightly low today Will add heparin. If change in Echo may need cardiac cath, will hold coumadin until results of Echo returned.  Last nuclear study was 2007 which was negative. We are awaiting her Echo which will give more direction to medical management.  Currently on Lasix 40 mg IV twice a day, is -1105 currently.  Will increase Lasix to every 8 hours.  CT of the chest was negative for pulmonary embolus. She has mild widening of her aorta, will also evaluate with echo.  MD to  see.  Leone Brand  Nurse Practitioner Certified [facility] Pager [phone] 01/20/2013, 8:56 AM    Agree with note written by Nada Boozer RNP  Pt well known to me with CAF rate controlled on coumadin AC. I recently saw her in our office 11/15/12. She fell and broke her pelvis, was in a nursing home since 6/5 and presented 6/8 with volume overload / CHF, SOB. BNP 5000. Diuresing on iv lasix. Rate is controlled on IV dilt, will change back to PO verapamil. Still has 2-3 + pitting BLE edema. Will restart coumadin AC. Continue IV lasix for now. Still has quite a bit of extra volume to get off prior to DC.  Runell Gess 01/20/2013 4:48 PM

## 2013-01-20 NOTE — Care Management Note (Signed)
    Page 1 of 1   01/20/2013     9:06:01 AM   CARE MANAGEMENT NOTE 01/20/2013  Patient:  Joanna Salas, Joanna Salas   Account Number:  [id number]  Date Initiated:  01/20/2013  Documentation initiated by:  Junius Creamer  Subjective/Objective Assessment:   adm w at fib, swelling extremities     Action/Plan:   from nsg facility   Anticipated DC Date:     Anticipated DC Plan:  SKILLED NURSING FACILITY  In-house referral  Clinical Social Worker      DC Planning Services  CM consult      Choice offered to / List presented to:             Status of service:   Medicare Important Message given?   (If response is "NO", the following Medicare IM given date fields will be blank) Date Medicare IM given:   Date Additional Medicare IM given:    Discharge Disposition:    Per UR Regulation:  Reviewed for med. necessity/level of care/duration of stay  If discussed at Long Length of Stay Meetings, dates discussed:    Comments:

## 2013-01-20 NOTE — Telephone Encounter (Signed)
Left message to call office to advise Pt that she will need OV for hospital f/u this week

## 2013-01-20 NOTE — Progress Notes (Signed)
ANTICOAGULATION CONSULT NOTE - Initial Consult  Pharmacy Consult for Heparin Indication: atrial fibrillation  Allergies  Allergen Reactions  . Pneumococcal Vaccines   . Atorvastatin Other (See Comments)    Severe Leg Cramps  . Ciprofloxacin Hcl Nausea And Vomiting  . Morphine Sulfate Nausea Only  . Penicillins     REACTION: unspecified  . Prednisone     unknown  . Sotalol Hcl   . Sulfonamide Derivatives Nausea Only    Patient Measurements: Height: 5\' 6"  (167.6 cm) Weight: 179 lb 14.3 oz (81.6 kg) IBW/kg (Calculated) : 59.3 Heparin Dosing Weight: 77 kg  Vital Signs: Temp: 97.3 F (36.3 C) (06/09 0745) Temp src: Oral (06/09 0745) BP: 141/75 mmHg (06/09 0745) Pulse Rate: 110 (06/09 0745)  Labs:  Recent Labs  01/19/13 1709 01/19/13 2201 01/20/13 0250  HGB 12.3  --  12.6  HCT 37.5  --  39.0  PLT 185  --  203  LABPROT 22.1*  --  21.1*  INR 2.03*  --  1.90*  CREATININE 0.80  --  0.76  TROPONINI  --  <0.30 <0.30    Estimated Creatinine Clearance: 55.4 ml/min (by C-G formula based on Cr of 0.76).   Medical History: Past Medical History  Diagnosis Date  . Personal history of colonic polyps   . Allergic rhinitis, cause unspecified   . Unspecified asthma(493.90)   . Personal history of other diseases of digestive system   . Personal history of urinary calculi   . Osteoporosis, unspecified   . DJD (degenerative joint disease)   . HTN (hypertension)   . Hyperlipidemia   . Anxiety state, unspecified   . Diabetes mellitus without complication   . Atrial fibrillation, permanent     Hx. of failed DCCV    Assessment: 87yof admitted with lower extremity edema and SOB to continued on Coumadin for Afib. INR 1.9 this morning, CTA reported no PE. Pro-BNP elevated, pending Echo, and decision on cardiac Cath, might need to hold coumadin if cath is scheduled soon. Pharmacy is consulted to start IV heparin  - H/H and Plts wnl  - No significant bleeding reported  Goal  of Therapy:  INR = 2-3 Heparin level 0.3-0.7 units/ml Monitor platelets by anticoagulation protocol: Yes   Plan:  - Heparin 1050 units/hr with no bolus - f/u Heparin level at 1730 - f/u Echo results and plan for cath  Bayard Hugger, PharmD, BCPS  Clinical Pharmacist  Pager: [phone]   01/20/2013,8:51 AM

## 2013-01-20 NOTE — Progress Notes (Signed)
[facility] Progress Note [place] TEAM 1 - Stepdown/ICU TEAM   Joanna Salas AVW:[id number] DOB: [birth year] DOA: 01/19/2013 PCP: Default, Provider, MD  Brief narrative: 79yo female with history of chronic atrial fibrillation on Coumadin. Recent discharge to skilled nursing facility on June 5 for rehabilitation following pelvic fracture. During the last hospitalization she developed acute renal failure and apparently was treated for an Escherichia coli urinary tract infection. Since discharge she has developed increasing bilateral lower extremity edema as well as dyspnea on exertion that has been limiting her physical therapy at the nursing facility. She was sent to the emergency department. Chest x-ray did not demonstrate significant pulmonary edema but she was mildly hypoxic with saturation 96% on 2 L. Her atrial fibrillation was also uncontrolled with rapid ventricular response rates around 140.  Assessment/Plan:  Atrial fibrillation, permanent with RVR -rate better controlled on low dose IV Cardizem  -appreciate Cards assistance -cont home Lopressor -ECHO pending-if any change in ECHO consideration for cath-so far TNI negative  Acute respiratory failure with hypoxia due to Acute CHF  -suspect due to RVR/mild tachycardia related CM -follow up on ECHO -initial CXR unrevealing  -cont IV Lasix- increased to q 8 hours  HYPERTENSION -controlled on usual meds -last admit ARB dc'd due to recent ARF  Recent Closed fracture of multiple pubic rami -once cardiac status stable will need PT/OT eval -has pain with mobilization -will return to SNF  Bilateral lower extremity edema -endorsed did sit with feet dependent for prolonged periods at SNF -tx as discussed above  Long term (current) use of anticoagulants -INR subtherapeutic -IV Heparin-pharmacy managing  Mitral valve regurgitation, mod TEE 2004, & mild to mod TR, EF 60%  CKD (chronic kidney disease) stage 3, GFR 30-59  ml/min -GFR has actually improved since last admit so at this point would avoid ARB/ACE if possible  E. coli UTI (urinary tract infection) -did not tolerate Cipro (N/V) so SNF started Macrobid -repeat UA/cx here before begin anbx's -endorsed urinary incontinence since dc of foley last admit.  DVT prophylaxis: IV heparin >> coumadin Code Status: Full Family Communication: Patient and daughter at bedside Disposition Plan: Stepdown  Consultants: Cardiology  Procedures: 2-D echocardiogram - pending  Antibiotics: None  HPI/Subjective: Patient alert and very anxious about her current diagnoses. She endorsed her husband recently died in Nov 09, 2022. Also endorsed had a difficult time transitioning to skilled nursing but has done better due to the interaction between her and her current roommate who she wishes to continue to room with once she is discharged. Has no awareness of any palpitations and has no shortness of breath or chest pain at rest.  Objective: Blood pressure 133/80, pulse 55, temperature 97.3 F (36.3 C), temperature source Oral, resp. rate 28, height 5\' 6"  (1.676 m), weight 81.6 kg (179 lb 14.3 oz), SpO2 96.00%.  Intake/Output Summary (Last 24 hours) at 01/20/13 1156 Last data filed at 01/20/13 1100  Gross per 24 hour  Intake    190 ml  Output   1150 ml  Net   -960 ml   Exam: General: No acute respiratory distress Lungs: Clear to auscultation bilaterally without wheezes or crackles, 2 L Cardiovascular: Regular rate and rhythm without murmur gallop or rub normal S1 and S2, significant tight peripheral edema of lower extremities; skin is quite shiny and weeping serous fluid. Right edema greater than left - estimated both around 2+ Abdomen: Nontender, nondistended, soft, bowel sounds positive, no rebound, no ascites, no appreciable mass Musculoskeletal: No significant cyanosis,  clubbing of bilateral lower extremities-has resolving ecchymosis posterior right  thigh Neurological: Alert and oriented x 3, moves all extremities x 4 without focal neurological deficits although somewhat weaker right leg secondary to known recent pelvic fracture, CN 2-12 intact  Scheduled Meds: Scheduled Meds: . furosemide  40 mg Intravenous Q8H  . loratadine  10 mg Oral Daily  . metoprolol  100 mg Oral BID  . pantoprazole  40 mg Oral Daily  . polyethylene glycol  17 g Oral Daily  . sodium chloride  3 mL Intravenous Q12H  . [START ON 01/21/2013] warfarin  1.25 mg Oral Custom  . [START ON 01/22/2013] warfarin  2.5 mg Oral Custom  . Warfarin - Pharmacist Dosing Inpatient   Does not apply q1800    Data Reviewed: Basic Metabolic Panel:  Recent Labs Lab 01/19/13 1709 01/19/13 2201 01/20/13 0250  NA 139  --  137  K 3.8  --  3.5  CL 103  --  99  CO2 29  --  33*  GLUCOSE 96  --  96  BUN 15  --  13  CREATININE 0.80  --  0.76  CALCIUM 8.1*  --  8.8  MG  --  1.8  --    Liver Function Tests:  Recent Labs Lab 01/19/13 1709  AST 25  ALT 22  ALKPHOS 78  BILITOT 1.0  PROT 5.3*  ALBUMIN 2.8*   CBC:  Recent Labs Lab 01/19/13 1709 01/20/13 0250  WBC 8.0 8.3  NEUTROABS 5.8  --   HGB 12.3 12.6  HCT 37.5 39.0  MCV 91.2 90.9  PLT 185 203   Cardiac Enzymes:  Recent Labs Lab 01/13/13 2018 01/14/13 0206 01/19/13 2201 01/20/13 0250 01/20/13 0820  TROPONINI <0.30 <0.30 <0.30 <0.30 <0.30   BNP (last 3 results)  Recent Labs  01/19/13 1709  PROBNP 4950.0*     Recent Results (from the past 240 hour(s))  URINE CULTURE     Status: None   Collection Time    01/11/13 11:17 AM      Result Value Range Status   Specimen Description URINE, RANDOM   Final   Special Requests NONE   Final   Culture  Setup Time 01/11/2013 17:49   Final   Colony Count >=100,000 COLONIES/ML   Final   Culture ESCHERICHIA COLI   Final   Report Status 01/14/2013 FINAL   Final   Organism ID, Bacteria ESCHERICHIA COLI   Final  MRSA PCR SCREENING     Status: None    Collection Time    01/19/13  9:21 PM      Result Value Range Status   MRSA by PCR NEGATIVE  NEGATIVE Final   Comment:            The GeneXpert MRSA Assay (FDA     approved for NASAL specimens     only), is one component of a     comprehensive MRSA colonization     surveillance program. It is not     intended to diagnose MRSA     infection nor to guide or     monitor treatment for     MRSA infections.     Studies:  Recent x-ray studies have been reviewed in detail by the Attending Physician  Scheduled Meds:  Reviewed in detail by the Attending Physician   Junious Silk, ANP [facility] Office  [phone] Pager [phone]  **If unable to reach the above provider after paging please contact the Flow Manager @  [phone]  On-Call/Text Page:      [email or web address]      password [email or web address]  If 7PM-7AM, please contact night-coverage [email or web address] Password [email or web address] 01/20/2013, 11:56 AM   LOS: 1 day   I have personally examined this patient and reviewed the entire database. I have reviewed the above note, made any necessary editorial changes, and agree with its content.  Lonia Blood, MD [facility]

## 2013-01-21 DIAGNOSIS — I5031 Acute diastolic (congestive) heart failure: Principal | ICD-10-CM

## 2013-01-21 LAB — HEPARIN LEVEL (UNFRACTIONATED): Heparin Unfractionated: 0.54 IU/mL (ref 0.30–0.70)

## 2013-01-21 LAB — URINALYSIS, ROUTINE W REFLEX MICROSCOPIC
Glucose, UA: NEGATIVE mg/dL
Specific Gravity, Urine: 1.011 (ref 1.005–1.030)
Urobilinogen, UA: 0.2 mg/dL (ref 0.0–1.0)
pH: 6.5 (ref 5.0–8.0)

## 2013-01-21 LAB — BASIC METABOLIC PANEL
CO2: 37 mEq/L — ABNORMAL HIGH (ref 19–32)
Chloride: 97 mEq/L (ref 96–112)
Creatinine, Ser: 0.83 mg/dL (ref 0.50–1.10)
Sodium: 139 mEq/L (ref 135–145)

## 2013-01-21 LAB — PRO B NATRIURETIC PEPTIDE: Pro B Natriuretic peptide (BNP): 2491 pg/mL — ABNORMAL HIGH (ref 0–450)

## 2013-01-21 LAB — URINE MICROSCOPIC-ADD ON

## 2013-01-21 MED ORDER — METOPROLOL TARTRATE 50 MG PO TABS
50.0000 mg | ORAL_TABLET | Freq: Two times a day (BID) | ORAL | Status: DC
  Administered 2013-01-21 – 2013-01-23 (×4): 50 mg via ORAL
  Filled 2013-01-21 (×5): qty 1

## 2013-01-21 MED ORDER — WARFARIN SODIUM 1 MG PO TABS
1.0000 mg | ORAL_TABLET | Freq: Once | ORAL | Status: AC
  Administered 2013-01-21: 1 mg via ORAL
  Filled 2013-01-21: qty 1

## 2013-01-21 MED ORDER — POTASSIUM CHLORIDE CRYS ER 20 MEQ PO TBCR
20.0000 meq | EXTENDED_RELEASE_TABLET | Freq: Once | ORAL | Status: AC
  Administered 2013-01-21: 20 meq via ORAL

## 2013-01-21 NOTE — Progress Notes (Signed)
Subjective:  Less SOB, "up all night peeing"  Objective:  Vital Signs in the last 24 hours: Temp:  [97.6 F (36.4 C)-98.4 F (36.9 C)] 97.9 F (36.6 C) (06/10 0319) Pulse Rate:  [32-133] 113 (06/10 0319) Resp:  [20-33] 24 (06/10 0319) BP: (103-136)/(60-102) 136/84 mmHg (06/10 0319) SpO2:  [94 %-98 %] 97 % (06/10 0319) Weight:  [176 lb 9.4 oz (80.1 kg)] 176 lb 9.4 oz (80.1 kg) (06/09 2334)  Intake/Output from previous day:  Intake/Output Summary (Last 24 hours) at 01/21/13 1003 Last data filed at 01/21/13 0600  Gross per 24 hour  Intake 576.73 ml  Output    160 ml  Net 416.73 ml    Physical Exam: General appearance: alert, cooperative, no distress and moderately obese Lungs: decreased breath sounds Heart: irregularly irregular rhythm and 2/6 systolic murmur Extrem: Trace edema   Rate: 100  Rhythm: atrial fibrillation  Lab Results:  Recent Labs  01/19/13 1709 01/20/13 0250  WBC 8.0 8.3  HGB 12.3 12.6  PLT 185 203    Recent Labs  01/20/13 0250 01/21/13 0515  NA 137 139  K 3.5 3.3*  CL 99 97  CO2 33* 37*  GLUCOSE 96 96  BUN 13 13  CREATININE 0.76 0.83    Recent Labs  01/20/13 0250 01/20/13 0820  TROPONINI <0.30 <0.30   Hepatic Function Panel  Recent Labs  01/19/13 1709  PROT 5.3*  ALBUMIN 2.8*  AST 25  ALT 22  ALKPHOS 78  BILITOT 1.0   No results found for this basename: CHOL,  in the last 72 hours  Recent Labs  01/21/13 0515  INR 2.52*    Imaging: Dg Chest 2 View  01/19/2013   *RADIOLOGY REPORT*  Clinical Data: Lower extremity edema.  History of atrial fibrillation.  CHEST - 2 VIEW    IMPRESSION: Cardiomegaly and small bilateral pleural effusions.  No overt pulmonary edema.   Original Report Authenticated By: Irish Lack, M.D.      Cardiac Studies: 2D 01/20/13- - Left ventricle: The cavity size was normal. Wall thickness was increased in a pattern of mild LVH. Systolic function was normal. The estimated ejection fraction was in  the range of 55% to 60%. Wall motion was normal; there were no regional wall motion abnormalities. Doppler parameters are consistent with elevated mean left atrial filling pressure. - Aortic valve: There was mild to moderate stenosis. Mild regurgitation. Valve area: 1.21cm^2(VTI). Valve area: 1.32cm^2 (Vmax). - Mitral valve: Calcified annulus. Mildly thickened leaflets . Mild to moderate regurgitation directed centrally. - Left atrium: The atrium was moderately to severely dilated. - Right ventricle: The cavity size was mildly dilated. Wall thickness was normal. - Right atrium: The atrium was moderately dilated. - Tricuspid valve: Moderate regurgitation. - Pulmonary arteries: Systolic pressure was mildly increased. PA peak pressure: 43mm Hg (S).   Assessment/Plan:   Principal Problem:   Acute diastolic CHF (congestive heart failure) Active Problems:   Atrial fibrillation, permanent   Recent Closed fracture of multiple pubic rami after fall at home. Sent to [facility] for rehab   Mitral valve regurgitation, mod. on TEE 2004, & mild to mod TR, EF 60%.   CKD (chronic kidney disease) stage 3, GFR 30-59 ml/min   HYPERTENSION   Long term (current) use of anticoagulants   Bilateral lower extremity edema    PLAN: Continue diuretics (she is on Lasix 40mg  Q8), extra K+ this am. Consider adding Aldactone.  I told her daughter she would need another 2 to  3 days, she is holding the pt's bed at [facility].  [name] PA-C Beeper [phone] 01/21/2013, 10:03 AM   Agree with note written by [name] [facility]  Looks much better. Slowly diuresing. CAF with CVR on coumadin AC with therapeutic INR. Exam notable for 1-2+ pitting edema. Breathing better. Continue IV lasix. Will decrease metoprolol to 50 mg PO BID. Prob home Thurs or Friday.   Runell Gess 01/21/2013 5:28 PM

## 2013-01-21 NOTE — Progress Notes (Addendum)
[facility] Progress Note [place] TEAM 1 - Stepdown/ICU TEAM   Joanna Salas WUX:[id number] DOB: [birth year] DOA: 01/19/2013 PCP: Default, Provider, MD  Brief narrative: 79yo female with history of chronic atrial fibrillation on Coumadin. Recent discharge to skilled nursing facility on June 5 for rehabilitation following pelvic fracture. During the last hospitalization she developed acute renal failure and apparently was treated for an Escherichia coli urinary tract infection. Since discharge she has developed increasing bilateral lower extremity edema as well as dyspnea on exertion that has been limiting her physical therapy at the nursing facility. She was sent to the emergency department. Chest x-ray did not demonstrate significant pulmonary edema but she was mildly hypoxic with saturation 96% on 2 L. Her atrial fibrillation was also uncontrolled with rapid ventricular response rates around 140.  Assessment/Plan:  Atrial fibrillation, permanent with RVR -rate better controlled on low dose IV Cardizem  -appreciate Cards assistance -cont home Lopressor -so far TNI negative  Acute respiratory failure with hypoxia due to Acute exacerbation of RHF  -suspect due to RVR/mild tachycardia related CM - ECHO reveals findings c/w RHF and associated moderate TR/mild pulmonary HTN -initial CXR unrevealing  -cont IV Lasix q 8 hours -Cards considering adding Aldactone  HYPERTENSION -controlled on usual meds -last admit ARB dc'd due to recent ARF  Hypokalemia -repleted  Recent Closed fracture of multiple pubic rami -once cardiac status stable will need PT/OT eval -has pain with mobilization -will return to SNF -begin PT/OT 6/10  Bilateral lower extremity edema -endorsed did sit with feet dependent for prolonged periods at SNF -tx as discussed above -improved  Long term (current) use of anticoagulants -INR sub therapeutic initially -IV Heparin-pharmacy managing  Mitral valve  regurgitation, mod/Aortic stenosis (mild to moderate) -TEE 2004, & mild to mod TR, EF 60% -ECHO this admit (see below)  CKD (chronic kidney disease) stage 3, GFR 30-59 ml/min -GFR has actually improved since last admit so at this point would avoid ARB/ACE if possible  E. coli UTI (urinary tract infection) -did not tolerate Cipro (N/V) so SNF started Macrobid -repeat UA here c/w significant UTI- cx pending-anbx's if cx positive -endorsed urinary incontinence since dc of foley last admit.  DVT prophylaxis: IV heparin >> coumadin Code Status: Full Family Communication: Patient and daughter at bedside Disposition Plan: Stepdown  Consultants: Cardiology  Procedures: 2-D echocardiogram  - Left ventricle: The cavity size was normal. Wall thickness was increased in a pattern of mild LVH. Systolic function was normal. The estimated ejection fraction was in the range of 55% to 60%. Wall motion was normal; there were no regional wall motion abnormalities. Doppler parameters are consistent with elevated mean left atrial filling pressure. - Aortic valve: There was mild to moderate stenosis. Mild regurgitation. Valve area: 1.21cm^2(VTI). Valve area: 1.32cm^2 (Vmax). - Mitral valve: Calcified annulus. Mildly thickened leaflets . Mild to moderate regurgitation directed centrally. - Left atrium: The atrium was moderately to severely dilated. - Right ventricle: The cavity size was mildly dilated. Wall thickness was normal. - Right atrium: The atrium was moderately dilated. - Tricuspid valve: Moderate regurgitation. - Pulmonary arteries: Systolic pressure was mildly increased. PA peak pressure: 43mm Hg (S).   Antibiotics: None  HPI/Subjective: Patient alert and endorses improved breathing.  Objective: Blood pressure 126/62, pulse 105, temperature 98.1 F (36.7 C), temperature source Oral, resp. rate 27, height 5\' 6"  (1.676 m), weight 80.1 kg (176 lb 9.4 oz), SpO2  96.00%.  Intake/Output Summary (Last 24 hours) at 01/21/13 1214 Last data filed  at 01/21/13 0600  Gross per 24 hour  Intake 446.73 ml  Output    160 ml  Net 286.73 ml   Exam: General: No acute respiratory distress Lungs: Clear to auscultation bilaterally without wheezes or crackles, 2 L Cardiovascular: Regular rate and rhythm without murmur gallop or rub normal S1 and S2,previous tight appearance to the peripheral edema of lower extremities and weeping has resolved . Right edema greater than left - estimated both around 2+ Abdomen: Nontender, nondistended, soft, bowel sounds positive, no rebound, no ascites, no appreciable mass Musculoskeletal: No significant cyanosis, clubbing of bilateral lower extremities-has resolving ecchymosis posterior right thigh Neurological: Alert and oriented x 3, moves all extremities x 4 without focal neurological deficits although somewhat weaker right leg secondary to known recent pelvic fracture, CN 2-12 intact  Scheduled Meds: Scheduled Meds: . furosemide  40 mg Intravenous Q8H  . loratadine  10 mg Oral Daily  . metoprolol  100 mg Oral BID  . pantoprazole  40 mg Oral Daily  . polyethylene glycol  17 g Oral Daily  . potassium chloride  20 mEq Oral BID  . verapamil  240 mg Oral Daily  . warfarin  1 mg Oral ONCE-1800  . Warfarin - Pharmacist Dosing Inpatient   Does not apply q1800    Data Reviewed: Basic Metabolic Panel:  Recent Labs Lab 01/19/13 1709 01/19/13 2201 01/20/13 0250 01/21/13 0515  NA 139  --  137 139  K 3.8  --  3.5 3.3*  CL 103  --  99 97  CO2 29  --  33* 37*  GLUCOSE 96  --  96 96  BUN 15  --  13 13  CREATININE 0.80  --  0.76 0.83  CALCIUM 8.1*  --  8.8 8.7  MG  --  1.8  --   --    Liver Function Tests:  Recent Labs Lab 01/19/13 1709  AST 25  ALT 22  ALKPHOS 78  BILITOT 1.0  PROT 5.3*  ALBUMIN 2.8*   CBC:  Recent Labs Lab 01/19/13 1709 01/20/13 0250  WBC 8.0 8.3  NEUTROABS 5.8  --   HGB 12.3 12.6  HCT  37.5 39.0  MCV 91.2 90.9  PLT 185 203   Cardiac Enzymes:  Recent Labs Lab 01/19/13 2201 01/20/13 0250 01/20/13 0820  TROPONINI <0.30 <0.30 <0.30   BNP (last 3 results)  Recent Labs  01/19/13 1709 01/21/13 0515  PROBNP 4950.0* 2491.0*     Recent Results (from the past 240 hour(s))  MRSA PCR SCREENING     Status: None   Collection Time    01/19/13  9:21 PM      Result Value Range Status   MRSA by PCR NEGATIVE  NEGATIVE Final   Comment:            The GeneXpert MRSA Assay (FDA     approved for NASAL specimens     only), is one component of a     comprehensive MRSA colonization     surveillance program. It is not     intended to diagnose MRSA     infection nor to guide or     monitor treatment for     MRSA infections.     Studies:  Recent x-ray studies have been reviewed in detail by the Attending Physician  Scheduled Meds:  Reviewed in detail by the Attending Physician Patient seen and examined . I have assessed and evaluated underlying  issues and agrees with the plan.  Junious Silk, ANP [facility] Office  [phone] Pager [phone]  **If unable to reach the above provider after paging please contact the Flow Manager @ [phone]  On-Call/Text Page:      [email or web address]      password [email or web address]  If 7PM-7AM, please contact night-coverage [email or web address] Password [email or web address] 01/21/2013, 12:14 PM   LOS: 2 days

## 2013-01-21 NOTE — Progress Notes (Signed)
ANTICOAGULATION CONSULT NOTE - Initial Consult  Pharmacy Consult for Coumadin Indication: atrial fibrillation  Allergies  Allergen Reactions  . Pneumococcal Vaccines   . Atorvastatin Other (See Comments)    Severe Leg Cramps  . Ciprofloxacin Hcl Nausea And Vomiting  . Morphine Sulfate Nausea Only  . Penicillins     REACTION: unspecified  . Prednisone     unknown  . Sotalol Hcl   . Sulfonamide Derivatives Nausea Only    Patient Measurements: Height: 5\' 6"  (167.6 cm) Weight: 176 lb 9.4 oz (80.1 kg) IBW/kg (Calculated) : 59.3 Heparin Dosing Weight:   Vital Signs: Temp: 97.9 F (36.6 C) (06/10 0319) Temp src: Oral (06/10 0319) BP: 125/62 mmHg (06/10 1018) Pulse Rate: 93 (06/10 1016)  Labs:  Recent Labs  01/19/13 1709 01/19/13 2201 01/20/13 0250 01/20/13 0820 01/20/13 1705 01/21/13 0515  HGB 12.3  --  12.6  --   --   --   HCT 37.5  --  39.0  --   --   --   PLT 185  --  203  --   --   --   LABPROT 22.1*  --  21.1*  --   --  26.0*  INR 2.03*  --  1.90*  --   --  2.52*  HEPARINUNFRC  --   --   --   --  0.46 0.54  CREATININE 0.80  --  0.76  --   --  0.83  TROPONINI  --  <0.30 <0.30 <0.30  --   --     Estimated Creatinine Clearance: 52.9 ml/min (by C-G formula based on Cr of 0.83).   Medical History: Past Medical History  Diagnosis Date  . Personal history of colonic polyps   . Allergic rhinitis, cause unspecified   . Unspecified asthma(493.90)   . Personal history of other diseases of digestive system   . Personal history of urinary calculi   . Osteoporosis, unspecified   . DJD (degenerative joint disease)   . HTN (hypertension)   . Hyperlipidemia   . Anxiety state, unspecified   . Diabetes mellitus without complication   . Atrial fibrillation, permanent     Hx. of failed DCCV  . Mitral valve regurgitation, mod. on TEE 2004, & mild to mod TR, EF 60%. 01/20/2013  . Pelvic fracture, recent history of 01/20/2013    Medications:  Prescriptions prior to  admission  Medication Sig Dispense Refill  . acetaminophen (TYLENOL) 500 MG tablet Take 1,000 mg by mouth every 6 (six) hours as needed for pain.      Marland Kitchen albuterol (PROAIR HFA) 108 (90 BASE) MCG/ACT inhaler Inhale 2 puffs into the lungs every 4 (four) hours as needed for wheezing or shortness of breath.  8.5 g  prn  . ALPRAZolam (XANAX) 0.25 MG tablet Take 1 tablet (0.25 mg total) by mouth at bedtime as needed.  90 tablet  0  . alum & mag hydroxide-simeth (MAALOX/MYLANTA) 200-200-20 MG/5ML suspension Take 15 mLs by mouth every 4 (four) hours as needed.  355 mL  0  . cetirizine (ZYRTEC) 10 MG tablet Take 10 mg by mouth at bedtime.      . [EXPIRED] ciprofloxacin (CIPRO) 500 MG tablet Take 1 tablet (500 mg total) by mouth 2 (two) times daily.      . meclizine (ANTIVERT) 25 MG tablet Take 25-50 mg by mouth 3 (three) times daily as needed for dizziness.      . metoprolol (LOPRESSOR) 100 MG tablet Take 1 tablet (  100 mg total) by mouth 2 (two) times daily.  60 tablet  0  . pantoprazole (PROTONIX) 40 MG tablet Take 1 tablet (40 mg total) by mouth daily.      . polyethylene glycol (MIRALAX / GLYCOLAX) packet Take 17 g by mouth daily.  14 each  0  . promethazine (PHENERGAN) 12.5 MG tablet Take 12.5-25 mg by mouth at bedtime as needed for nausea (dizziness).      . raloxifene (EVISTA) 60 MG tablet Take 60 mg by mouth daily.        . traMADol (ULTRAM) 50 MG tablet Take 1 tablet (50 mg total) by mouth every 6 (six) hours as needed for pain.  30 tablet  0  . verapamil (VERELAN PM) 240 MG 24 hr capsule Take 240 mg by mouth daily.        Marland Kitchen warfarin (COUMADIN) 2.5 MG tablet Take 1.25-2.5 mg by mouth daily. 0.5 tab on Sun, Tues, and Thurs; 1 tab all other days.        Assessment: 87yof admitted with lower extremity edema and SOB to continue chronic Coumadin for Afib. INR (2.52) is therapeutic this morning. No significant bleeding reported  PTA regimen is 2.5mg  daily except 1.25mg  on Tue/Thur/Sun   Goal of  Therapy:  INR 2-3 Monitor platelets by anticoagulation protocol: Yes   Plan:  - Coumadin 1 mg tonight - D/C IV heparin - Daily INR  Bayard Hugger, PharmD, BCPS  Clinical Pharmacist  Pager: [phone]  01/21/2013,10:50 AM

## 2013-01-22 LAB — URINE CULTURE

## 2013-01-22 LAB — BASIC METABOLIC PANEL
CO2: 41 mEq/L (ref 19–32)
Chloride: 94 mEq/L — ABNORMAL LOW (ref 96–112)
Creatinine, Ser: 0.99 mg/dL (ref 0.50–1.10)
GFR calc Af Amer: 59 mL/min — ABNORMAL LOW (ref >90)
Potassium: 3.8 mEq/L (ref 3.5–5.1)
Sodium: 141 mEq/L (ref 135–145)

## 2013-01-22 LAB — PROTIME-INR
INR: 2.5 — ABNORMAL HIGH (ref 0.00–1.49)
Prothrombin Time: 25.8 seconds — ABNORMAL HIGH (ref 11.6–15.2)

## 2013-01-22 LAB — HEPARIN LEVEL (UNFRACTIONATED): Heparin Unfractionated: <0.1 IU/mL — ABNORMAL LOW (ref 0.30–0.70)

## 2013-01-22 MED ORDER — WARFARIN SODIUM 2 MG PO TABS
2.0000 mg | ORAL_TABLET | Freq: Once | ORAL | Status: AC
  Administered 2013-01-22: 2 mg via ORAL
  Filled 2013-01-22: qty 1

## 2013-01-22 MED ORDER — DEXTROSE 5 % IV SOLN
1.0000 g | INTRAVENOUS | Status: DC
  Administered 2013-01-22 – 2013-01-26 (×5): 1 g via INTRAVENOUS
  Filled 2013-01-22 (×5): qty 10

## 2013-01-22 MED ORDER — ACETAZOLAMIDE 250 MG PO TABS
250.0000 mg | ORAL_TABLET | Freq: Two times a day (BID) | ORAL | Status: AC
  Administered 2013-01-22 (×2): 250 mg via ORAL
  Filled 2013-01-22 (×2): qty 1

## 2013-01-22 NOTE — Progress Notes (Signed)
[facility]  Subjective: She gets fatigued walking to the bathroom. No SOB at rest. She feels the swelling in her legs has improved.   Objective: Vital signs in last 24 hours: Temp:  [97.7 F (36.5 C)-98.6 F (37 C)] 98.1 F (36.7 C) (06/11 1200) Pulse Rate:  [56-124] 104 (06/11 1200) Resp:  [18-28] 22 (06/11 1200) BP: (96-144)/(37-83) 131/72 mmHg (06/11 1200) SpO2:  [90 %-98 %] 93 % (06/11 1200) Weight:  [169 lb 12.1 oz (77 kg)] 169 lb 12.1 oz (77 kg) (06/11 0006) Last BM Date: 01/21/13  Intake/Output from previous day: 06/10 0701 - 06/11 0700 In: 843.3 [P.O.:810; I.V.:33.3] Out: 200 [Urine:200] Intake/Output this shift: Total I/O In: -  Out: 300 [Urine:300]  Medications Current Facility-Administered Medications  Medication Dose Route Frequency Provider Last Rate Last Dose  . acetaminophen (TYLENOL) tablet 650 mg  650 mg Oral Q6H PRN Henderson Cloud, MD       Or  . acetaminophen (TYLENOL) suppository 650 mg  650 mg Rectal Q6H PRN Henderson Cloud, MD      . ALPRAZolam Prudy Feeler) tablet 0.25 mg  0.25 mg Oral QHS PRN Henderson Cloud, MD   0.25 mg at 01/21/13 2331  . cefTRIAXone (ROCEPHIN) 1 g in dextrose 5 % 50 mL IVPB  1 g Intravenous Q24H Lonia Blood, MD      . furosemide (LASIX) injection 40 mg  40 mg Intravenous Q8H Nada Boozer, NP   40 mg at 01/22/13 0945  . loratadine (CLARITIN) tablet 10 mg  10 mg Oral Daily Henderson Cloud, MD   10 mg at 01/22/13 0946  . metoprolol (LOPRESSOR) tablet 50 mg  50 mg Oral BID Runell Gess, MD   50 mg at 01/22/13 0946  . ondansetron (ZOFRAN) tablet 4 mg  4 mg Oral Q6H PRN Henderson Cloud, MD       Or  . ondansetron [facility]) injection 4 mg  4 mg Intravenous Q6H PRN Henderson Cloud, MD      . pantoprazole (PROTONIX) EC tablet 40 mg  40 mg Oral Daily Henderson Cloud, MD   40 mg at 01/21/13 1017  . polyethylene glycol (MIRALAX /  GLYCOLAX) packet 17 g  17 g Oral Daily Henderson Cloud, MD   17 g at 01/21/13 1018  . senna-docusate (Senokot-S) tablet 1 tablet  1 tablet Oral QHS PRN Henderson Cloud, MD      . traMADol Janean Sark) tablet 50 mg  50 mg Oral Q6H PRN Henderson Cloud, MD      . verapamil (CALAN-SR) CR tablet 240 mg  240 mg Oral Daily Nada Boozer, NP   240 mg at 01/22/13 0946  . warfarin (COUMADIN) tablet 2 mg  2 mg Oral ONCE-1800 Richarda Overlie, MD      . Warfarin - Pharmacist Dosing Inpatient   Does not apply q1800 Cleon Dew, [facility]        PE: General appearance: alert, cooperative and no distress Lungs: minimal bibasilar rales R>L Heart: irregularly irregular rhythm Extremities: 2+ bilateral LEE Pulses: 2+ and symmetric Skin: warm and dry Neurologic: Grossly normal  Lab Results:   Recent Labs  01/19/13 1709 01/20/13 0250  WBC 8.0 8.3  HGB 12.3 12.6  HCT 37.5 39.0  PLT 185 203   BMET  Recent Labs  01/20/13 0250 01/21/13 0515 01/22/13 0400  NA 137 139 141  K  3.5 3.3* 3.8  CL 99 97 94*  CO2 33* 37* 41*  GLUCOSE 96 96 101*  BUN 13 13 16   CREATININE 0.76 0.83 0.99  CALCIUM 8.8 8.7 9.2   PT/INR  Recent Labs  01/20/13 0250 01/21/13 0515 01/22/13 0400  LABPROT 21.1* 26.0* 25.8*  INR 1.90* 2.52* 2.50*   BNP (last 3 results)  Recent Labs  01/19/13 1709 01/21/13 0515  PROBNP 4950.0* 2491.0*   Filed Weights   01/19/13 2118 01/20/13 2334 01/22/13 0006  Weight: 179 lb 14.3 oz (81.6 kg) 176 lb 9.4 oz (80.1 kg) 169 lb 12.1 oz (77 kg)   Assessment/Plan  Principal Problem:   Acute diastolic CHF (congestive heart failure) Active Problems:   HYPERTENSION   Atrial fibrillation, permanent   Long term (current) use of anticoagulants   Recent Closed fracture of multiple pubic rami after fall at home. Sent to [facility] for rehab   Mitral valve regurgitation, mod. on TEE 2004, & mild to mod TR, EF 60%.   Bilateral lower extremity edema   CKD  (chronic kidney disease) stage 3, GFR 30-59 ml/min  Plan: Admitted 3 days ago for A/C CHF and a-fib w/ RVR. It does not appear that strict I/Os have been recorded. Her weight is down 10 lbs since admission. BNP has improved to 2,491 (4,950 on admission). She still has some peripheral edema on exam. Continue with diuresis with IV Lasix. Renal function is stable w/ SCr of 0.99. K+ is WNL. Her a-fib is chronic and is now rate controlled. She is off of IV Diltiazem and back on PO verapamil, as well as PO Lopressor. Continue with warfarin for A/C. Will continue to monitor.     LOS: 3 days    Brittainy M. Delmer Islam 01/22/2013 12:59 PM   Patient seen and examined. Agree with assessment and plan. I/0 -205 since admission since fluid balance was positive yesterday. Will continue IV lasix today.  CO2 increased at 41 suggests metabolic alkalosis; may benefit from diamox, will give 250 mg for 2 doses 6 hrs apart today.  AF rate is controlled.  Lennette Bihari, MD, [facility] 01/22/2013 1:35 PM

## 2013-01-22 NOTE — Progress Notes (Signed)
ANTICOAGULATION CONSULT NOTE - Initial Consult  Pharmacy Consult for Coumadin Indication: atrial fibrillation  Allergies  Allergen Reactions  . Pneumococcal Vaccines   . Atorvastatin Other (See Comments)    Severe Leg Cramps  . Ciprofloxacin Hcl Nausea And Vomiting  . Morphine Sulfate Nausea Only  . Penicillins     REACTION: unspecified  . Prednisone     unknown  . Sotalol Hcl   . Sulfonamide Derivatives Nausea Only    Patient Measurements: Height: 5\' 6"  (167.6 cm) Weight: 169 lb 12.1 oz (77 kg) IBW/kg (Calculated) : 59.3 Heparin Dosing Weight:   Vital Signs: Temp: 97.7 F (36.5 C) (06/11 0733) Temp src: Oral (06/11 0733) BP: 116/83 mmHg (06/11 0733) Pulse Rate: 124 (06/11 0733)  Labs:  Recent Labs  01/19/13 1709 01/19/13 2201 01/20/13 0250 01/20/13 0820 01/20/13 1705 01/21/13 0515 01/22/13 0400  HGB 12.3  --  12.6  --   --   --   --   HCT 37.5  --  39.0  --   --   --   --   PLT 185  --  203  --   --   --   --   LABPROT 22.1*  --  21.1*  --   --  26.0* 25.8*  INR 2.03*  --  1.90*  --   --  2.52* 2.50*  HEPARINUNFRC  --   --   --   --  0.46 0.54 <0.10*  CREATININE 0.80  --  0.76  --   --  0.83 0.99  TROPONINI  --  <0.30 <0.30 <0.30  --   --   --     Estimated Creatinine Clearance: 43.5 ml/min (by C-G formula based on Cr of 0.99).   Medical History: Past Medical History  Diagnosis Date  . Personal history of colonic polyps   . Allergic rhinitis, cause unspecified   . Unspecified asthma(493.90)   . Personal history of other diseases of digestive system   . Personal history of urinary calculi   . Osteoporosis, unspecified   . DJD (degenerative joint disease)   . HTN (hypertension)   . Hyperlipidemia   . Anxiety state, unspecified   . Diabetes mellitus without complication   . Atrial fibrillation, permanent     Hx. of failed DCCV  . Mitral valve regurgitation, mod. on TEE 2004, & mild to mod TR, EF 60%. 01/20/2013  . Pelvic fracture, recent history  of 01/20/2013    Medications:  Prescriptions prior to admission  Medication Sig Dispense Refill  . acetaminophen (TYLENOL) 500 MG tablet Take 1,000 mg by mouth every 6 (six) hours as needed for pain.      Marland Kitchen albuterol (PROAIR HFA) 108 (90 BASE) MCG/ACT inhaler Inhale 2 puffs into the lungs every 4 (four) hours as needed for wheezing or shortness of breath.  8.5 g  prn  . ALPRAZolam (XANAX) 0.25 MG tablet Take 1 tablet (0.25 mg total) by mouth at bedtime as needed.  90 tablet  0  . alum & mag hydroxide-simeth (MAALOX/MYLANTA) 200-200-20 MG/5ML suspension Take 15 mLs by mouth every 4 (four) hours as needed.  355 mL  0  . cetirizine (ZYRTEC) 10 MG tablet Take 10 mg by mouth at bedtime.      . [EXPIRED] ciprofloxacin (CIPRO) 500 MG tablet Take 1 tablet (500 mg total) by mouth 2 (two) times daily.      . meclizine (ANTIVERT) 25 MG tablet Take 25-50 mg by mouth 3 (three) times daily  as needed for dizziness.      . metoprolol (LOPRESSOR) 100 MG tablet Take 1 tablet (100 mg total) by mouth 2 (two) times daily.  60 tablet  0  . pantoprazole (PROTONIX) 40 MG tablet Take 1 tablet (40 mg total) by mouth daily.      . polyethylene glycol (MIRALAX / GLYCOLAX) packet Take 17 g by mouth daily.  14 each  0  . promethazine (PHENERGAN) 12.5 MG tablet Take 12.5-25 mg by mouth at bedtime as needed for nausea (dizziness).      . raloxifene (EVISTA) 60 MG tablet Take 60 mg by mouth daily.        . traMADol (ULTRAM) 50 MG tablet Take 1 tablet (50 mg total) by mouth every 6 (six) hours as needed for pain.  30 tablet  0  . verapamil (VERELAN PM) 240 MG 24 hr capsule Take 240 mg by mouth daily.        Marland Kitchen warfarin (COUMADIN) 2.5 MG tablet Take 1.25-2.5 mg by mouth daily. 0.5 tab on Sun, Tues, and Thurs; 1 tab all other days.        Assessment: 87yof admitted with lower extremity edema and SOB to continue chronic Coumadin for Afib. INR (2.5) is therapeutic this morning. No significant bleeding reported,  IV heparin d/c'd  yesterday  PTA regimen is 2.5mg  daily except 1.25mg  on Tue/Thur/Sun   Goal of Therapy:  INR 2-3 Monitor platelets by anticoagulation protocol: Yes   Plan:  - Coumadin 2 mg tonight - Daily INR  Bayard Hugger, PharmD, BCPS  Clinical Pharmacist  Pager: [phone]  01/22/2013,7:47 AM

## 2013-01-22 NOTE — Progress Notes (Signed)
[facility] Progress Note [place] TEAM 1 - Stepdown/ICU TEAM   Joanna Salas UJW:[id number] DOB: [birth year] DOA: 01/19/2013 PCP: Default, Provider, MD  Brief narrative: 79yo female with history of chronic atrial fibrillation on Coumadin. Recent discharge to skilled nursing facility on June 5 for rehabilitation following pelvic fracture. During the last hospitalization she developed acute renal failure and apparently was treated for an Escherichia coli urinary tract infection. Since discharge she has developed increasing bilateral lower extremity edema as well as dyspnea on exertion that has been limiting her physical therapy at the nursing facility. She was sent to the emergency department. Chest x-ray did not demonstrate significant pulmonary edema but she was mildly hypoxic with saturation 96% on 2 L. Her atrial fibrillation was also uncontrolled with rapid ventricular response rates around 140.  Assessment/Plan:  Atrial fibrillation, permanent with RVR -rate inconsistently controlled on PO Cardizem - noted to have RVR with rates 120-130's this am (6/11)  -appreciate Cards assistance -cont home Lopressor noting dose decreased to 50 BID 6/10 by Cards- ?? Need to bumped back up?? -so far TNI negative  Acute respiratory failure with hypoxia due to Acute exacerbation of RHF  -suspect due to RVR/mild tachycardia related CM - ECHO reveals findings c/w RHF and associated moderate TR/mild pulmonary HTN -initial CXR unrevealing  -cont IV Lasix q 8 hours -Cards considering adding Aldactone  HYPERTENSION -controlled on usual meds -last admit ARB dc'd due to recent ARF  Hypokalemia -repleted  Recent Closed fracture of multiple pubic rami -once cardiac status stable will need PT/OT eval -has pain with mobilization -will return to SNF -begin PT/OT 6/10  Bilateral lower extremity edema -endorsed did sit with feet dependent for prolonged periods at SNF -tx as discussed  above -improved  Long term (current) use of anticoagulants -INR sub therapeutic initially -IV Heparin-pharmacy managing  Mitral valve regurgitation, mod/Aortic stenosis (mild to moderate) -TEE 2004, & mild to mod TR, EF 60% -ECHO this admit (see below)  CKD (chronic kidney disease) stage 3, GFR 30-59 ml/min -GFR had actually initially improved compared to last admit but has decreased with aggressive diuresis so follow closely -would avoid ARB/ACE if possible  E. coli UTI (urinary tract infection) -did not tolerate Cipro (N/V) so SNF started Macrobid -sensitivities pending -Empiric Rocephin 6/11 -endorsed urinary incontinence since dc of foley last admit.  DVT prophylaxis: IV heparin >> coumadin Code Status: Full Family Communication: Patient at bedside Disposition Plan: Stepdown due to recurrent RVR  Consultants: Cardiology  Procedures: 2-D echocardiogram  - Left ventricle: The cavity size was normal. Wall thickness was increased in a pattern of mild LVH. Systolic function was normal. The estimated ejection fraction was in the range of 55% to 60%. Wall motion was normal; there were no regional wall motion abnormalities. Doppler parameters are consistent with elevated mean left atrial filling pressure. - Aortic valve: There was mild to moderate stenosis. Mild regurgitation. Valve area: 1.21cm^2(VTI). Valve area: 1.32cm^2 (Vmax). - Mitral valve: Calcified annulus. Mildly thickened leaflets . Mild to moderate regurgitation directed centrally. - Left atrium: The atrium was moderately to severely dilated. - Right ventricle: The cavity size was mildly dilated. Wall thickness was normal. - Right atrium: The atrium was moderately dilated. - Tricuspid valve: Moderate regurgitation. - Pulmonary arteries: Systolic pressure was mildly increased. PA peak pressure: 43mm Hg (S).  Antibiotics: Rocephin 6/11 >>  HPI/Subjective: Patient alert and endorses improved breathing.  Eager to dc back to prior SNF. Denies cp, n/v, or abdom pain.   Objective:  Blood pressure 116/83, pulse 124, temperature 97.7 F (36.5 C), temperature source Oral, resp. rate 25, height 5\' 6"  (1.676 m), weight 77 kg (169 lb 12.1 oz), SpO2 96.00%. No intake or output data in the 24 hours ending 01/22/13 1207  Exam: General: No acute respiratory distress Lungs: Clear to auscultation bilaterally without wheezes or crackles, 2 L Cardiovascular: Irregular rate and rhythm with occ. RVR, no murmur gallop or rub normal S1 and S2, previous tight appearance to the peripheral edema of lower extremities and weeping has resolved . Right edema greater than left - estimated both around 2+ and primarily limited to just above the ankles Abdomen: Nontender, nondistended, soft, bowel sounds positive, no rebound, no ascites, no appreciable mass Musculoskeletal: No significant cyanosis, clubbing of bilateral lower extremities-has resolving ecchymosis posterior right thigh Neurological: Alert and oriented x 3, moves all extremities x 4 without focal neurological deficits although somewhat weaker right leg secondary to known recent pelvic fracture, CN 2-12 intact  Scheduled Meds: Scheduled Meds: . cefTRIAXone (ROCEPHIN)  IV  1 g Intravenous Q24H  . furosemide  40 mg Intravenous Q8H  . loratadine  10 mg Oral Daily  . metoprolol  50 mg Oral BID  . pantoprazole  40 mg Oral Daily  . polyethylene glycol  17 g Oral Daily  . verapamil  240 mg Oral Daily  . warfarin  2 mg Oral ONCE-1800  . Warfarin - Pharmacist Dosing Inpatient   Does not apply q1800    Data Reviewed: Basic Metabolic Panel:  Recent Labs Lab 01/19/13 1709 01/19/13 2201 01/20/13 0250 01/21/13 0515 01/22/13 0400  NA 139  --  137 139 141  K 3.8  --  3.5 3.3* 3.8  CL 103  --  99 97 94*  CO2 29  --  33* 37* 41*  GLUCOSE 96  --  96 96 101*  BUN 15  --  13 13 16   CREATININE 0.80  --  0.76 0.83 0.99  CALCIUM 8.1*  --  8.8 8.7 9.2  MG  --  1.8   --   --   --    Liver Function Tests:  Recent Labs Lab 01/19/13 1709  AST 25  ALT 22  ALKPHOS 78  BILITOT 1.0  PROT 5.3*  ALBUMIN 2.8*   CBC:  Recent Labs Lab 01/19/13 1709 01/20/13 0250  WBC 8.0 8.3  NEUTROABS 5.8  --   HGB 12.3 12.6  HCT 37.5 39.0  MCV 91.2 90.9  PLT 185 203   Cardiac Enzymes:  Recent Labs Lab 01/19/13 2201 01/20/13 0250 01/20/13 0820  TROPONINI <0.30 <0.30 <0.30   BNP (last 3 results)  Recent Labs  01/19/13 1709 01/21/13 0515  PROBNP 4950.0* 2491.0*     Recent Results (from the past 240 hour(s))  MRSA PCR SCREENING     Status: None   Collection Time    01/19/13  9:21 PM      Result Value Range Status   MRSA by PCR NEGATIVE  NEGATIVE Final   Comment:            The GeneXpert MRSA Assay (FDA     approved for NASAL specimens     only), is one component of a     comprehensive MRSA colonization     surveillance program. It is not     intended to diagnose MRSA     infection nor to guide or     monitor treatment for     MRSA infections.  Studies:  Recent x-ray studies have been reviewed in detail by the Attending Physician  Junious Silk, ANP [facility] Office  [phone] Pager [phone]  **If unable to reach the above provider after paging please contact the Flow Manager @ [phone]  On-Call/Text Page:      [email or web address]      password [email or web address]  If 7PM-7AM, please contact night-coverage [email or web address] Password [email or web address] 01/22/2013, 12:07 PM   LOS: 3 days   I have personally examined this patient and reviewed the entire database. I have reviewed the above note, made any necessary editorial changes, and agree with its content.  Lonia Blood, MD [facility]

## 2013-01-22 NOTE — Evaluation (Signed)
Physical Therapy Evaluation Patient Details Name: Joanna Salas MRN: [id number] DOB: [birth year] Today's Date: 01/22/2013 Time: 4098-1191 PT Time Calculation (min): 26 min  PT Assessment / Plan / Recommendation Clinical Impression  Pt is an 79 yo female admitted from SNF due to LE edema. Pt with previous R superior and infereior pubic rami fxs. Pt requires max assist for bed mobility and max-+1 total assist for transfers. Pt would benefit from return to [facility] to progress OOB mobility and achieve modI for ADLs once medically stable prior to return home. Discussed with pt, pt agreeable.    PT Assessment  Patient needs continued PT services    Follow Up Recommendations  SNF    Does the patient have the potential to tolerate intense rehabilitation      Barriers to Discharge        Equipment Recommendations  Rolling walker with 5" wheels    Recommendations for Other Services     Frequency Min 3X/week    Precautions / Restrictions Precautions Precautions: Fall Restrictions Weight Bearing Restrictions: Yes RLE Weight Bearing: Weight bearing as tolerated   Pertinent Vitals/Pain 5/10 in R groin      Mobility  Bed Mobility Bed Mobility: Supine to Sit Supine to Sit: 2: Max assist;HOB flat (Pt not able to use arms to help pull herself up despite WFL strength ) Sitting - Scoot to Edge of Bed: 3: Mod assist Details for Bed Mobility Assistance: Increased time, assist to raise trunk and pull hips forward Transfers Transfers: Sit to Stand;Stand to Sit;Stand Pivot Transfers Sit to Stand: 2: Max assist;From bed;With upper extremity assist (from bed x2) Stand to Sit: 2: Max assist;To bed Stand Pivot Transfers: 1: +1 Total assist Stand Pivot Transfers: Patient Percentage: 40% Details for Transfer Assistance: Pt required assist to stand from bed, asked to sit again because she was actively urinating on herself. With second attempt to stand pivot, pt with flexed trunk and 40% assist  with max v/c's to tuck hips under. Pt with tachycardia to HR:141 with static sitting and transfers. Quickly returning to HR 126 with rest.    Exercises     PT Diagnosis: Difficulty walking;Generalized weakness  PT Problem List: Decreased strength;Decreased range of motion;Decreased activity tolerance;Decreased balance;Decreased mobility;Decreased coordination;Decreased knowledge of use of DME;Decreased safety awareness;Cardiopulmonary status limiting activity PT Treatment Interventions: DME instruction;Gait training;Functional mobility training;Therapeutic activities;Therapeutic exercise;Balance training;Neuromuscular re-education   PT Goals Acute Rehab PT Goals PT Goal Formulation: With patient Time For Goal Achievement: 02/05/13 Potential to Achieve Goals: Good Pt will go Supine/Side to Sit: with mod assist PT Goal: Supine/Side to Sit - Progress: Goal set today Pt will go Sit to Stand: with mod assist PT Goal: Sit to Stand - Progress: Goal set today Pt will Ambulate: 16 - 50 feet;with mod assist;with least restrictive assistive device PT Goal: Ambulate - Progress: Goal set today  Visit Information  Last PT Received On: 01/22/13 Assistance Needed: +1    Subjective Data  Subjective: Pt recieved in bed saying " i want to get up this bed is hurting my back" Patient Stated Goal: to d/c back to SNF   Prior Functioning  Home Living Additional Comments: Prior to previous hospital admission pt lived alone and was independent. Follow hospital stay for pelvis fx, pt was d/c'ed to [facility] for rehab where she was dependent for all activities.  Pt was re-admitted due to onset of LE edema. Pt wishes to return to [facility] when medically stable to progress rehabilitation for pelvis fx.  Communication Communication: No difficulties    Cognition  Cognition Arousal/Alertness: Awake/alert Behavior During Therapy: WFL for tasks assessed/performed Overall Cognitive Status: Within Functional Limits  for tasks assessed    Extremity/Trunk Assessment Right Upper Extremity Assessment RUE ROM/Strength/Tone: WFL for tasks assessed Left Upper Extremity Assessment LUE ROM/Strength/Tone: WFL for tasks assessed Right Lower Extremity Assessment RLE ROM/Strength/Tone: Unable to fully assess;Due to pain (Pt able to complete AROM) Left Lower Extremity Assessment LLE ROM/Strength/Tone: [facility] for tasks assessed   Balance Balance Balance Assessed: Yes Static Sitting Balance Static Sitting - Balance Support: Bilateral upper extremity supported;Feet supported Static Sitting - Level of Assistance: 6: Modified independent (Device/Increase time) Static Sitting - Comment/# of Minutes: 3  End of Session PT - End of Session Equipment Utilized During Treatment: Gait belt Activity Tolerance: Patient limited by fatigue;Patient limited by pain Patient left: in chair;with call bell/phone within reach Nurse Communication: Mobility status (pt's breakfast incorrect, linens need to be changed)  GP     01/22/2013, 12:16 PM Joanna Salas, Student Physical Therapist Office #: [phone]  Agree with above assessment.  Lewis Shock, PT, DPT Pager #: [phone] Office #: [phone]

## 2013-01-22 NOTE — Evaluation (Signed)
Physical Therapy Evaluation Patient Details Name: Joanna Salas MRN: [id number] DOB: [birth year] Today's Date: 01/22/2013 Time: 8756-4332 PT Time Calculation (min): 26 min  PT Assessment / Plan / Recommendation Clinical Impression  Pt is an 79 yo female admitted from SNF due to LE edema. Pt with previous R superior and infereior pubic rami fxs. Pt requires max assist for bed mobility and max-+1 total assist for transfers. Pt would benefit from return to [facility] to progress OOB mobility and achieve modI for ADLs once medically stable prior to return home. Discussed with pt, pt agreeable.    PT Assessment  Patient needs continued PT services    Follow Up Recommendations  SNF    Does the patient have the potential to tolerate intense rehabilitation      Barriers to Discharge        Equipment Recommendations  Rolling walker with 5" wheels    Recommendations for Other Services     Frequency Min 3X/week    Precautions / Restrictions Precautions Precautions: Fall Restrictions Weight Bearing Restrictions: Yes RLE Weight Bearing: Weight bearing as tolerated   Pertinent Vitals/Pain 5/10 in R groin      Mobility  Bed Mobility Bed Mobility: Supine to Sit Supine to Sit: 2: Max assist;HOB flat (Pt not able to use arms to help pull herself up despite WFL strength ) Sitting - Scoot to Edge of Bed: 3: Mod assist Details for Bed Mobility Assistance: Increased time, assist to raise trunk and pull hips forward Transfers Transfers: Sit to Stand;Stand to Sit;Stand Pivot Transfers Sit to Stand: 2: Max assist;From bed;With upper extremity assist (from bed x2) Stand to Sit: 2: Max assist;To bed Stand Pivot Transfers: 1: +1 Total assist Stand Pivot Transfers: Patient Percentage: 40% Details for Transfer Assistance: Pt required assist to stand from bed, asked to sit again because she was actively urinating on herself. With second attempt to stand pivot, pt with flexed trunk and 40% assist  with max v/c's to tuck hips under. Pt with tachycardia to HR:141 with static sitting and transfers. Quickly returning to HR 126 with rest.    Exercises     PT Diagnosis: Difficulty walking;Generalized weakness  PT Problem List: Decreased strength;Decreased range of motion;Decreased activity tolerance;Decreased balance;Decreased mobility;Decreased coordination;Decreased knowledge of use of DME;Decreased safety awareness;Cardiopulmonary status limiting activity PT Treatment Interventions: DME instruction;Gait training;Functional mobility training;Therapeutic activities;Therapeutic exercise;Balance training;Neuromuscular re-education   PT Goals Acute Rehab PT Goals PT Goal Formulation: With patient Time For Goal Achievement: 02/05/13 Potential to Achieve Goals: Good Pt will go Supine/Side to Sit: with mod assist PT Goal: Supine/Side to Sit - Progress: Goal set today Pt will go Sit to Stand: with mod assist PT Goal: Sit to Stand - Progress: Goal set today Pt will Ambulate: 16 - 50 feet;with mod assist;with least restrictive assistive device PT Goal: Ambulate - Progress: Goal set today  Visit Information  Last PT Received On: 01/22/13 Assistance Needed: +1    Subjective Data  Subjective: Pt recieved in bed saying " i want to get up this bed is hurting my back" Patient Stated Goal: to d/c back to SNF   Prior Functioning  Home Living Additional Comments: Prior to previous hospital admission pt lived alone and was independent. Follow hospital stay for pelvis fx, pt was d/c'ed to [facility] place for rehab where she was dependent for all activities.  Pt was re-admitted due to onset of LE edema. Pt wishes to return to [facility] when medically stable to progress rehabilitation for pelvis fx.  Communication Communication: No difficulties    Cognition  Cognition Arousal/Alertness: Awake/alert Behavior During Therapy: WFL for tasks assessed/performed Overall Cognitive Status: Within Functional Limits  for tasks assessed    Extremity/Trunk Assessment Right Upper Extremity Assessment RUE ROM/Strength/Tone: WFL for tasks assessed Left Upper Extremity Assessment LUE ROM/Strength/Tone: WFL for tasks assessed Right Lower Extremity Assessment RLE ROM/Strength/Tone: Unable to fully assess;Due to pain (Pt able to complete AROM) Left Lower Extremity Assessment LLE ROM/Strength/Tone: [facility] for tasks assessed   Balance Balance Balance Assessed: Yes Static Sitting Balance Static Sitting - Balance Support: Bilateral upper extremity supported;Feet supported Static Sitting - Level of Assistance: 6: Modified independent (Device/Increase time) Static Sitting - Comment/# of Minutes: 3  End of Session PT - End of Session Equipment Utilized During Treatment: Gait belt Activity Tolerance: Patient limited by fatigue;Patient limited by pain Patient left: in chair;with call bell/phone within reach Nurse Communication: Mobility status (pt's breakfast incorrect, linens need to be changed)  GP     01/22/2013, 12:16 PM Marvis Moeller, Student Physical Therapist Office #: [phone]

## 2013-01-22 NOTE — Telephone Encounter (Signed)
lmovm for pt to return call & schedule hospital follow-up.

## 2013-01-23 DIAGNOSIS — E873 Alkalosis: Secondary | ICD-10-CM

## 2013-01-23 DIAGNOSIS — N179 Acute kidney failure, unspecified: Secondary | ICD-10-CM

## 2013-01-23 DIAGNOSIS — E876 Hypokalemia: Secondary | ICD-10-CM

## 2013-01-23 LAB — BASIC METABOLIC PANEL
Calcium: 9.3 mg/dL (ref 8.4–10.5)
GFR calc Af Amer: 62 mL/min — ABNORMAL LOW (ref >90)
GFR calc non Af Amer: 53 mL/min — ABNORMAL LOW (ref >90)
Glucose, Bld: 98 mg/dL (ref 70–99)
Potassium: 3.4 mEq/L — ABNORMAL LOW (ref 3.5–5.1)
Sodium: 139 mEq/L (ref 135–145)

## 2013-01-23 LAB — PROTIME-INR: Prothrombin Time: 22.8 seconds — ABNORMAL HIGH (ref 11.6–15.2)

## 2013-01-23 MED ORDER — LIDOCAINE-EPINEPHRINE 1 %-1:100000 IJ SOLN
INTRAMUSCULAR | Status: AC
  Filled 2013-01-23: qty 1

## 2013-01-23 MED ORDER — METOPROLOL TARTRATE 50 MG PO TABS
75.0000 mg | ORAL_TABLET | Freq: Two times a day (BID) | ORAL | Status: DC
  Administered 2013-01-23 – 2013-01-25 (×4): 75 mg via ORAL
  Filled 2013-01-23 (×5): qty 1

## 2013-01-23 MED ORDER — MAGNESIUM OXIDE 400 (241.3 MG) MG PO TABS
400.0000 mg | ORAL_TABLET | Freq: Once | ORAL | Status: AC
  Administered 2013-01-23: 400 mg via ORAL
  Filled 2013-01-23: qty 1

## 2013-01-23 MED ORDER — POTASSIUM CHLORIDE CRYS ER 20 MEQ PO TBCR
40.0000 meq | EXTENDED_RELEASE_TABLET | Freq: Once | ORAL | Status: AC
  Administered 2013-01-23: 40 meq via ORAL
  Filled 2013-01-23: qty 2

## 2013-01-23 MED ORDER — WARFARIN SODIUM 3 MG PO TABS
3.0000 mg | ORAL_TABLET | Freq: Once | ORAL | Status: AC
  Administered 2013-01-23: 3 mg via ORAL
  Filled 2013-01-23: qty 1

## 2013-01-23 MED ORDER — ACETAZOLAMIDE 250 MG PO TABS
250.0000 mg | ORAL_TABLET | Freq: Two times a day (BID) | ORAL | Status: AC
  Administered 2013-01-23 (×2): 250 mg via ORAL
  Filled 2013-01-23 (×2): qty 1

## 2013-01-23 NOTE — Evaluation (Signed)
Occupational Therapy Evaluation Patient Details Name: Joanna Salas MRN: [id number] DOB: Feb 14, 1925 Today's Date: 01/23/2013 Time: 4098-1191 OT Time Calculation (min): 34 min  OT Assessment / Plan / Recommendation Clinical Impression  Patient is a pleasant 79 year old white woman with past medical history significant for atrial fibrillation maintained on chronic anticoagulation with Coumadin, hypertension, who recently was discharged to skilled nursing facility from [facility] long hospital on June 5 following pelvic fractures. During that hospitalization she developed acute renal failure. She returns today to the hospital as she has had increasing bilateral lower extremity edema all the way up to her thighs.  She currently needs max assist for functional transfers and LB selfcare tasks.  Will benefit from acute care OT to help increase strength and ADL independence, however pt will need return to SNF for further rehab.  Pt with elevated HR throughout session 120-152 with sitting EOB and transferring to bedside commode and bedside chair.    OT Assessment  Patient needs continued OT Services    Follow Up Recommendations  SNF    Barriers to Discharge Decreased caregiver support    Equipment Recommendations  None recommended by OT       Frequency  Min 2X/week    Precautions / Restrictions Precautions Precautions: Fall Restrictions Weight Bearing Restrictions: Yes RLE Weight Bearing: Weight bearing as tolerated   Pertinent Vitals/Pain Pain 2/4 with transfer in the right pelvic area. O2 sats 98 % on room air during session.  HR increasing 120 - 152 during session.    ADL  Eating/Feeding: Simulated;Independent Where Assessed - Eating/Feeding: Chair Grooming: Performed;Set up;Wash/dry hands Where Assessed - Grooming: Unsupported sitting Upper Body Bathing: Simulated;Set up Where Assessed - Upper Body Bathing: Unsupported sitting Lower Body Bathing: Maximal assistance Where Assessed -  Lower Body Bathing: Supported sit to stand Upper Body Dressing: Simulated;Set up Where Assessed - Upper Body Dressing: Unsupported sitting Lower Body Dressing: Performed;Maximal assistance Where Assessed - Lower Body Dressing: Supported sit to stand Toilet Transfer: Performed;Maximal assistance Toilet Transfer Method: Surveyor, minerals: Materials engineer and Hygiene: Performed;Maximal assistance Where Assessed - Engineer, mining and Hygiene: Sit to stand from 3-in-1 or toilet Transfers/Ambulation Related to ADLs: Pt currently needs max assist for stand pivot transfer from bed to bedside toilet without assistive device. ADL Comments: Pt is overall max assist for sit to stand during LB selfcare and toileting tasks.  Unable to perform adequate stepping during transfers secondary to weakness and pain and instead slid her feet out to the side.  Pt with elevated HR and tachycardia throughout session.  HR from 120 to 152 with sitting and transfer to the the 3:1.    OT Diagnosis: Generalized weakness;Acute pain  OT Problem List: Decreased strength;Decreased activity tolerance;Impaired balance (sitting and/or standing);Decreased knowledge of use of DME or AE;Cardiopulmonary status limiting activity;Pain OT Treatment Interventions: Self-care/ADL training;Therapeutic activities;Therapeutic exercise;Patient/family education;DME and/or AE instruction;Balance training   OT Goals Acute Rehab OT Goals OT Goal Formulation: With patient Time For Goal Achievement: 02/06/13 Potential to Achieve Goals: Good ADL Goals Pt Will Perform Grooming: with min assist;Standing at sink ADL Goal: Grooming - Progress: Goal set today Pt Will Perform Lower Body Bathing: with min assist;Sit to stand from bed ADL Goal: Lower Body Bathing - Progress: Goal set today Pt Will Perform Lower Body Dressing: with min assist;Sit to stand from bed ADL Goal: Lower Body  Dressing - Progress: Goal set today Pt Will Transfer to Toilet: with min assist;Ambulation;with DME;3-in-1 ADL Goal:  Statistician - Progress: Goal set today Pt Will Perform Toileting - Clothing Manipulation: with min assist;Sitting on 3-in-1 or toilet;Standing ADL Goal: Toileting - Clothing Manipulation - Progress: Goal set today Pt Will Perform Toileting - Hygiene: with min assist;Sit to stand from 3-in-1/toilet ADL Goal: Toileting - Hygiene - Progress: Goal set today  Visit Information  Last OT Received On: 01/23/13 Assistance Needed: +1    Subjective Data  Subjective: I need to go back I'm paying to hold that room. Patient Stated Goal: Did not state   Prior Functioning     Home Living Additional Comments: Prior to previous hospital admission pt lived alone and was independent. Follow hospital stay for pelvis fx, pt was d/c'ed to [facility] for rehab where she was dependent for all activities.  Pt was re-admitted due to onset of LE edema. Pt wishes to return to [facility] when medically stable to progress rehabilitation for pelvis fx. Communication Communication: No difficulties         Vision/Perception Vision - History Baseline Vision: No visual deficits Patient Visual Report: No change from baseline Vision - Assessment Eye Alignment: Within Functional Limits Vision Assessment: Vision not tested Perception Perception: Within Functional Limits Praxis Praxis: Intact   Cognition  Cognition Arousal/Alertness: Awake/alert Behavior During Therapy: WFL for tasks assessed/performed Overall Cognitive Status: Within Functional Limits for tasks assessed    Extremity/Trunk Assessment Right Upper Extremity Assessment RUE ROM/Strength/Tone: WFL for tasks assessed RUE Sensation: WFL - Light Touch RUE Coordination: WFL - gross/fine motor Left Upper Extremity Assessment LUE ROM/Strength/Tone: WFL for tasks assessed LUE Sensation: WFL - Light Touch LUE Coordination: WFL -  gross/fine motor     Mobility Bed Mobility Bed Mobility: Supine to Sit Supine to Sit: 3: Mod assist;HOB elevated Sitting - Scoot to Edge of Bed: 3: Mod assist Transfers Transfers: Sit to Stand Sit to Stand: 2: Max assist;From chair/3-in-1;With upper extremity assist Stand to Sit: 2: Max assist;To chair/3-in-1;With upper extremity assist        Balance Static Sitting Balance Static Sitting - Balance Support: Right upper extremity supported;Left upper extremity supported Static Sitting - Level of Assistance: 6: Modified independent (Device/Increase time) Static Standing Balance Static Standing - Level of Assistance: 2: Max assist   End of Session OT - End of Session Activity Tolerance: Patient limited by fatigue;Other (comment) (limited by elevated HR) Patient left: in chair;with call bell/phone within reach Nurse Communication: Mobility status     Adelyna Brockman OTR/L Pager number [phone] 01/23/2013, 10:00 AM

## 2013-01-23 NOTE — Progress Notes (Signed)
ANTICOAGULATION CONSULT NOTE - Follow Up Consult  Pharmacy Consult for Coumadin Indication: atrial fibrillation  Allergies  Allergen Reactions  . Pneumococcal Vaccines   . Atorvastatin Other (See Comments)    Severe Leg Cramps  . Ciprofloxacin Hcl Nausea And Vomiting  . Morphine Sulfate Nausea Only  . Penicillins     REACTION: unspecified  . Prednisone     unknown  . Sotalol Hcl   . Sulfonamide Derivatives Nausea Only    Patient Measurements: Height: 5\' 6"  (167.6 cm) Weight: 165 lb 12.6 oz (75.2 kg) IBW/kg (Calculated) : 59.3 Heparin Dosing Weight:   Vital Signs: Temp: 98.5 F (36.9 C) (06/12 0721) Temp src: Oral (06/12 0721) BP: 123/77 mmHg (06/12 0721) Pulse Rate: 113 (06/12 0721)  Labs:  Recent Labs  01/20/13 1705 01/21/13 0515 01/22/13 0400 01/23/13 0340  LABPROT  --  26.0* 25.8* 22.8*  INR  --  2.52* 2.50* 2.11*  HEPARINUNFRC 0.46 0.54 <0.10*  --   CREATININE  --  0.83 0.99 0.95    Estimated Creatinine Clearance: 44.9 ml/min (by C-G formula based on Cr of 0.95).  Assessment: 87yof continuing on chronic Coumadin for Afib. INR (2.11) is therapeutic but has trended down to low end of range - will increase dose and follow-up AM INR. - No CBC this AM - No significant bleeding reported - PTA regimen: 2.5mg  daily except 1.25mg  on TTSun  Goal of Therapy:  INR 2-3   Plan:  1. Coumadin 3mg  po x 1 today 2. Follow-up AM INR  Cleon Dew [phone] 01/23/2013,9:45 AM

## 2013-01-23 NOTE — Progress Notes (Signed)
Physical Therapy Treatment Patient Details Name: Joanna Salas MRN: [id number] DOB: 07-02-1925 Today's Date: 01/23/2013 Time: 5284-1324 PT Time Calculation (min): 24 min  PT Assessment / Plan / Recommendation Comments on Treatment Session  Pt able to progress with mobility today.  Ambulated ~8' with RW.      Follow Up Recommendations  SNF     Does the patient have the potential to tolerate intense rehabilitation     Barriers to Discharge        Equipment Recommendations  Rolling walker with 5" wheels    Recommendations for Other Services    Frequency Min 3X/week   Plan      Precautions / Restrictions Precautions Precautions: Fall Restrictions RLE Weight Bearing: Weight bearing as tolerated       Mobility  Bed Mobility Bed Mobility: Supine to Sit;Sitting - Scoot to Edge of Bed Supine to Sit: 4: Min assist Sitting - Scoot to [place] of Bed: 4: Min assist Details for Bed Mobility Assistance: (A) to lift shoulders/trunk to sitting uprgight & use of draw pad to pivot hips around & bring hips closer to EOB.  Pt did well with advancing LE's to EOB by herself.   Transfers Transfers: Sit to Stand;Stand to Sit Sit to Stand: 3: Mod assist Stand to Sit: 4: Min assist Details for Transfer Assistance: Cues for hand placement & technique.  Pt incontinent of bladder with standing.  (A) to achieve standing, balance, & controlled descent.   Ambulation/Gait Ambulation/Gait Assistance: 4: Min assist (+2 to follow with recliner ) Ambulation Distance (Feet): 8 Feet Assistive device: Rolling walker Ambulation/Gait Assistance Details: Cues for sequencing, RW advancement, lateral weight shifting.   Pt fearful of falling.   Gait Pattern: Step-through pattern;Decreased stride length;Decreased weight shift to right;Decreased weight shift to left;Decreased hip/knee flexion - right;Decreased hip/knee flexion - left    Exercises General Exercises - Lower Extremity Ankle Circles/Pumps: AROM;Both;10  reps Long Arc Quad: AROM;Both;10 reps Hip Flexion/Marching: AAROM;Both;10 reps (AAROM RLE) Toe Raises: AROM;Both;10 reps Heel Raises: AROM;Both;10 reps    PT Goals Acute Rehab PT Goals Time For Goal Achievement: 02/05/13 Potential to Achieve Goals: Good Pt will go Supine/Side to Sit: with mod assist PT Goal: Supine/Side to Sit - Progress: Progressing toward goal Pt will go Sit to Stand: with mod assist PT Goal: Sit to Stand - Progress: Progressing toward goal Pt will Ambulate: 16 - 50 feet;with mod assist;with least restrictive assistive device PT Goal: Ambulate - Progress: Progressing toward goal  Visit Information  Last PT Received On: 01/23/13 Assistance Needed: +1    Subjective Data      Cognition  Cognition Arousal/Alertness: Awake/alert Behavior During Therapy: WFL for tasks assessed/performed Overall Cognitive Status: Within Functional Limits for tasks assessed    Balance     End of Session PT - End of Session Equipment Utilized During Treatment: Gait belt Activity Tolerance: Patient limited by fatigue Patient left: in chair;with call bell/phone within reach Nurse Communication: Mobility status     Verdell Face, [place] [phone] 01/23/2013

## 2013-01-23 NOTE — Progress Notes (Signed)
[facility]  Subjective: Feeling better. Eager to get back to SNF.  Objective: Vital signs in last 24 hours: Temp:  [98 F (36.7 C)-98.7 F (37.1 C)] 98.5 F (36.9 C) (06/12 0721) Pulse Rate:  [66-113] 113 (06/12 0721) Resp:  [18-33] 19 (06/12 0721) BP: (99-131)/(44-77) 123/77 mmHg (06/12 0721) SpO2:  [93 %-94 %] 94 % (06/12 0721) Weight:  [165 lb 12.6 oz (75.2 kg)] 165 lb 12.6 oz (75.2 kg) (06/12 0014) Last BM Date: 01/22/13  Intake/Output from previous day: 06/11 0701 - 06/12 0700 In: 480 [P.O.:480] Out: 852 [Urine:850; Stool:2] Intake/Output this shift: Total I/O In: 240 [P.O.:240] Out: -   Medications Current Facility-Administered Medications  Medication Dose Route Frequency Provider Last Rate Last Dose  . acetaminophen (TYLENOL) tablet 650 mg  650 mg Oral Q6H PRN Henderson Cloud, MD       Or  . acetaminophen (TYLENOL) suppository 650 mg  650 mg Rectal Q6H PRN Henderson Cloud, MD      . ALPRAZolam Prudy Feeler) tablet 0.25 mg  0.25 mg Oral QHS PRN Henderson Cloud, MD   0.25 mg at 01/21/13 2331  . cefTRIAXone (ROCEPHIN) 1 g in dextrose 5 % 50 mL IVPB  1 g Intravenous Q24H Lonia Blood, MD   1 g at 01/22/13 1350  . furosemide (LASIX) injection 40 mg  40 mg Intravenous Q8H Nada Boozer, NP   40 mg at 01/23/13 0910  . loratadine (CLARITIN) tablet 10 mg  10 mg Oral Daily Henderson Cloud, MD   10 mg at 01/23/13 0908  . metoprolol (LOPRESSOR) tablet 50 mg  50 mg Oral BID Runell Gess, MD   50 mg at 01/23/13 0908  . ondansetron (ZOFRAN) tablet 4 mg  4 mg Oral Q6H PRN Henderson Cloud, MD       Or  . ondansetron [facility]) injection 4 mg  4 mg Intravenous Q6H PRN Henderson Cloud, MD      . pantoprazole (PROTONIX) EC tablet 40 mg  40 mg Oral Daily Henderson Cloud, MD   40 mg at 01/23/13 0908  . polyethylene glycol (MIRALAX / GLYCOLAX) packet 17 g  17 g Oral Daily Henderson Cloud, MD   17 g at 01/21/13 1018  . senna-docusate (Senokot-S) tablet 1 tablet  1 tablet Oral QHS PRN Henderson Cloud, MD      . traMADol Janean Sark) tablet 50 mg  50 mg Oral Q6H PRN Henderson Cloud, MD      . verapamil (CALAN-SR) CR tablet 240 mg  240 mg Oral Daily Nada Boozer, NP   240 mg at 01/23/13 0908  . Warfarin - Pharmacist Dosing Inpatient   Does not apply q1800 Cleon Dew, [facility]        PE: General appearance: alert, cooperative and no distress Lungs: clear to auscultation bilaterally Heart: irregularly irregular rhythm Extremities: 1+ LEE on the right, 2+ LEE on the left Pulses: 2+ and symmetric Skin: warm and dry Neurologic: Grossly normal  Lab Results:  No results found for this basename: WBC, HGB, HCT, PLT,  in the last 72 hours BMET  Recent Labs  01/21/13 0515 01/22/13 0400 01/23/13 0340  NA 139 141 139  K 3.3* 3.8 3.4*  CL 97 94* 95*  CO2 37* 41* 38*  GLUCOSE 96 101* 98  BUN 13 16 16   CREATININE 0.83 0.99 0.95  CALCIUM 8.7 9.2  9.3   PT/INR  Recent Labs  01/21/13 0515 01/22/13 0400 01/23/13 0340  LABPROT 26.0* 25.8* 22.8*  INR 2.52* 2.50* 2.11*   Filed Weights   01/20/13 2334 01/22/13 0006 01/23/13 0014  Weight: 176 lb 9.4 oz (80.1 kg) 169 lb 12.1 oz (77 kg) 165 lb 12.6 oz (75.2 kg)   BNP (last 3 results)  Recent Labs  01/19/13 1709 01/21/13 0515  PROBNP 4950.0* 2491.0*   Assessment/Plan  Principal Problem:   Acute diastolic CHF (congestive heart failure) Active Problems:   HYPERTENSION   Atrial fibrillation, permanent   Long term (current) use of anticoagulants   Recent Closed fracture of multiple pubic rami after fall at home. Sent to [facility] for rehab   Mitral valve regurgitation, mod. on TEE 2004, & mild to mod TR, EF 60%.   Bilateral lower extremity edema   CKD (chronic kidney disease) stage 3, GFR 30-59 ml/min  Plan: Pt is tachycardic with a HR in the 120s. Her Lopressor was decreased  on 6/10 from her home dose of 100 mg BID to 50 mg. I agree with TRH decision to increase back up to 100 mg. Pt has permanent A-fib. Will continue monitoring HR. If not improved with increase in BB, may consider CCB. Her last echo was 01/20/13 and she had normal systolic function w/ EF of 55-60%. Her BP is stable and should tolerate the increase. Her INR is therapeutic at 2.11. Her CHF is improving. Her weight is down 14 lbs since admission. Breathing is better, but she still has some LEE, 1+ on the right and 2+ on the left. Continue diuresing with IV Lasix. Renal function is stable w/ SCr of 0.95. She is hypokalemic at 3.4. Will need to replace. Will continue to monitor.      LOS: 4 days    Joanna Salas 01/23/2013 9:43 AM   Patient seen and examined. Agree with assessment and plan. Some improvement in metabolic alkalosis with diamox 2 doses; CO2 41 to 38. Need to further K replete to correct. Mg 1.8; will give 400 mg Mg Oxide x1. Will increase lopressor to 75 mg bid. F/U BNP, Bmet tomorrow.   Lennette Bihari, MD, [facility] 01/23/2013 10:07 AM

## 2013-01-23 NOTE — Progress Notes (Signed)
[facility] Progress Note [place] TEAM 1 - Stepdown/ICU TEAM   Joanna Salas YQM:[id number] DOB: May 04, 1925 DOA: 01/19/2013 PCP: Default, Provider, MD  Brief narrative: 79yo female with history of chronic atrial fibrillation on Coumadin. Recent discharge to skilled nursing facility on June 5 for rehabilitation following pelvic fracture. During the last hospitalization she developed acute renal failure and apparently was treated for an Escherichia coli urinary tract infection. Since discharge she has developed increasing bilateral lower extremity edema as well as dyspnea on exertion that has been limiting her physical therapy at the nursing facility. She was sent to the emergency department. Chest x-ray did not demonstrate significant pulmonary edema but she was mildly hypoxic with saturation 96% on 2 L. Her atrial fibrillation was also uncontrolled with rapid ventricular response rates around 140.  Assessment/Plan:  Atrial fibrillation, permanent with RVR -rate inconsistently controlled on PO Cardizem - noted to have RVR with rates up to the 140's this am (6/12)  -appreciate Cards assistance -cont home Lopressor noting dose decreased to 50 BID 6/10 by Cards- they have decided to increase BB to 75 BID -so far TNI negative  Acute respiratory failure with hypoxia due to Acute exacerbation of RHF  -suspect due to RVR/mild tachycardia related CM - ECHO reveals findings c/w RHF and associated moderate TR/mild pulmonary HTN-persistent RLE edema -initial CXR unrevealing  -cont IV Lasix q 8 hours -Cards dosing Diamox based on daily labs  HYPERTENSION -controlled on usual meds -last admit ARB dc'd due to recent ARF  Hypokalemia -repleted by Cards- also repleting Mg given RVR  Recent Closed fracture of multiple pubic rami -once cardiac status stable will need PT/OT eval -has pain with mobilization -will return to SNF -begin PT/OT 6/10  Bilateral lower extremity edema -endorsed  did sit with feet dependent for prolonged periods at SNF -tx as discussed above -improved  Long term (current) use of anticoagulants -INR sub therapeutic initially -IV Heparin-pharmacy managing  Mitral valve regurgitation, mod/Aortic stenosis (mild to moderate) -TEE 2004, & mild to mod TR, EF 60% -ECHO this admit (see below)  CKD (chronic kidney disease) stage 3, GFR 30-59 ml/min -GFR had actually initially improved compared to last admit but has decreased with aggressive diuresis so follow closely -would avoid ARB/ACE if possible  E. coli UTI (urinary tract infection) -did not tolerate Cipro (N/V) so SNF started Macrobid -sensitivities pending -Empiric Rocephin 6/11 -endorsed urinary incontinence since dc of foley last admit.  DVT prophylaxis: IV heparin >> coumadin Code Status: Full Family Communication: Patient at bedside Disposition Plan: Stepdown due to recurrent RVR  Consultants: Cardiology  Procedures: 2-D echocardiogram  - Left ventricle: The cavity size was normal. Wall thickness was increased in a pattern of mild LVH. Systolic function was normal. The estimated ejection fraction was in the range of 55% to 60%. Wall motion was normal; there were no regional wall motion abnormalities. Doppler parameters are consistent with elevated mean left atrial filling pressure. - Aortic valve: There was mild to moderate stenosis. Mild regurgitation. Valve area: 1.21cm^2(VTI). Valve area: 1.32cm^2 (Vmax). - Mitral valve: Calcified annulus. Mildly thickened leaflets . Mild to moderate regurgitation directed centrally. - Left atrium: The atrium was moderately to severely dilated. - Right ventricle: The cavity size was mildly dilated. Wall thickness was normal. - Right atrium: The atrium was moderately dilated. - Tricuspid valve: Moderate regurgitation. - Pulmonary arteries: Systolic pressure was mildly increased. PA peak pressure: 43mm Hg (S).  Antibiotics: Rocephin  6/11 >>  HPI/Subjective: Patient alert and endorses  improved breathing. Eager to dc back to prior SNF due to cost of holding SNF bed. Denies cp, n/v, or abdom pain.   Objective: Blood pressure 103/26, pulse 72, temperature 98.3 F (36.8 C), temperature source Oral, resp. rate 17, height 5\' 6"  (1.676 m), weight 75.2 kg (165 lb 12.6 oz), SpO2 96.00%.  Intake/Output Summary (Last 24 hours) at 01/23/13 1253 Last data filed at 01/23/13 1131  Gross per 24 hour  Intake    820 ml  Output    952 ml  Net   -132 ml    Exam: General: No acute respiratory distress Lungs: Clear to auscultation bilaterally without wheezes or crackles, 2 L Cardiovascular: Irregular rate and rhythm with occ. RVR, no murmur gallop or rub normal S1 and S2, previous tight appearance to the peripheral edema of lower extremities and weeping has resolved . Right edema greater than left - estimated both around 2+ on right and limited to the ankles and left edema has resolved. Abdomen: Nontender, nondistended, soft, bowel sounds positive, no rebound, no ascites, no appreciable mass Musculoskeletal: No significant cyanosis, clubbing of bilateral lower extremities-has resolving ecchymosis posterior right thigh Neurological: Alert and oriented x 3, moves all extremities x 4 without focal neurological deficits although somewhat weaker right leg secondary to known recent pelvic fracture, CN 2-12 intact  Scheduled Meds: Scheduled Meds: . acetaZOLAMIDE  250 mg Oral BID  . cefTRIAXone (ROCEPHIN)  IV  1 g Intravenous Q24H  . furosemide  40 mg Intravenous Q8H  . loratadine  10 mg Oral Daily  . metoprolol  75 mg Oral BID  . pantoprazole  40 mg Oral Daily  . polyethylene glycol  17 g Oral Daily  . verapamil  240 mg Oral Daily  . warfarin  3 mg Oral ONCE-1800  . Warfarin - Pharmacist Dosing Inpatient   Does not apply q1800    Data Reviewed: Basic Metabolic Panel:  Recent Labs Lab 01/19/13 1709 01/19/13 2201 01/20/13 0250  01/21/13 0515 01/22/13 0400 01/23/13 0340  NA 139  --  137 139 141 139  K 3.8  --  3.5 3.3* 3.8 3.4*  CL 103  --  99 97 94* 95*  CO2 29  --  33* 37* 41* 38*  GLUCOSE 96  --  96 96 101* 98  BUN 15  --  13 13 16 16   CREATININE 0.80  --  0.76 0.83 0.99 0.95  CALCIUM 8.1*  --  8.8 8.7 9.2 9.3  MG  --  1.8  --   --   --  1.8   Liver Function Tests:  Recent Labs Lab 01/19/13 1709  AST 25  ALT 22  ALKPHOS 78  BILITOT 1.0  PROT 5.3*  ALBUMIN 2.8*   CBC:  Recent Labs Lab 01/19/13 1709 01/20/13 0250  WBC 8.0 8.3  NEUTROABS 5.8  --   HGB 12.3 12.6  HCT 37.5 39.0  MCV 91.2 90.9  PLT 185 203   Cardiac Enzymes:  Recent Labs Lab 01/19/13 2201 01/20/13 0250 01/20/13 0820  TROPONINI <0.30 <0.30 <0.30   BNP (last 3 results)  Recent Labs  01/19/13 1709 01/21/13 0515  PROBNP 4950.0* 2491.0*     Recent Results (from the past 240 hour(s))  MRSA PCR SCREENING     Status: None   Collection Time    01/19/13  9:21 PM      Result Value Range Status   MRSA by PCR NEGATIVE  NEGATIVE Final   Comment:  The GeneXpert MRSA Assay (FDA     approved for NASAL specimens     only), is one component of a     comprehensive MRSA colonization     surveillance program. It is not     intended to diagnose MRSA     infection nor to guide or     monitor treatment for     MRSA infections.  URINE CULTURE     Status: None   Collection Time    01/21/13  3:00 AM      Result Value Range Status   Specimen Description URINE, CLEAN CATCH   Final   Special Requests NONE   Final   Culture  Setup Time 01/21/2013 04:24   Final   Colony Count 20,OOO COLONIES/ML   Final   Culture     Final   Value: Multiple bacterial morphotypes present, none predominant. Suggest appropriate recollection if clinically indicated.   Report Status 01/22/2013 FINAL   Final     Studies:  Recent x-ray studies have been reviewed in detail by the Attending Physician  Junious Silk, ANP [facility]  [phone] Pager [phone]  **If unable to reach the above provider after paging please contact the Flow Manager @ [phone]  On-Call/Text Page:      [email or web address]      password [email or web address]  If 7PM-7AM, please contact night-coverage [email or web address] Password [facility] 01/23/2013, 12:53 PM   LOS: 4 days      I have examined the patient, reviewed the chart and modified the above note which I agree with.   Kaci Freel,MD [phone] 01/23/2013, 1:47 PM

## 2013-01-24 ENCOUNTER — Encounter: Payer: Self-pay | Admitting: *Deleted

## 2013-01-24 LAB — BASIC METABOLIC PANEL
BUN: 18 mg/dL (ref 6–23)
CO2: 35 mEq/L — ABNORMAL HIGH (ref 19–32)
Calcium: 9.1 mg/dL (ref 8.4–10.5)
Chloride: 96 mEq/L (ref 96–112)
Creatinine, Ser: 1.25 mg/dL — ABNORMAL HIGH (ref 0.50–1.10)
GFR calc Af Amer: 44 mL/min — ABNORMAL LOW (ref >90)

## 2013-01-24 LAB — PROTIME-INR: Prothrombin Time: 24.3 seconds — ABNORMAL HIGH (ref 11.6–15.2)

## 2013-01-24 MED ORDER — WARFARIN SODIUM 2.5 MG PO TABS
2.5000 mg | ORAL_TABLET | Freq: Once | ORAL | Status: AC
  Administered 2013-01-24: 2.5 mg via ORAL
  Filled 2013-01-24: qty 1

## 2013-01-24 MED ORDER — FUROSEMIDE 40 MG PO TABS
40.0000 mg | ORAL_TABLET | Freq: Two times a day (BID) | ORAL | Status: DC
  Administered 2013-01-24 – 2013-01-27 (×6): 40 mg via ORAL
  Filled 2013-01-24 (×11): qty 1

## 2013-01-24 NOTE — Telephone Encounter (Signed)
lmovm for pt to return call.  Mailed letter.

## 2013-01-24 NOTE — Progress Notes (Signed)
Subjective:  She denies SOB. She has been up with pt. She is anxious to go back to SNF as the family is paying to hold the bed.  Objective:  Vital Signs in the last 24 hours: Temp:  [97.9 F (36.6 C)-98.6 F (37 C)] 97.9 F (36.6 C) (06/13 0807) Pulse Rate:  [72-117] 117 (06/13 0807) Resp:  [17-33] 21 (06/13 0807) BP: (96-124)/(26-69) 116/66 mmHg (06/13 0807) SpO2:  [95 %-99 %] 96 % (06/13 0807) Weight:  [72.7 kg (160 lb 4.4 oz)] 72.7 kg (160 lb 4.4 oz) (06/13 0455)  Intake/Output from previous day:  Intake/Output Summary (Last 24 hours) at 01/24/13 0852 Last data filed at 01/24/13 0500  Gross per 24 hour  Intake    340 ml  Output   1950 ml  Net  -1610 ml   . cefTRIAXone (ROCEPHIN)  IV  1 g Intravenous Q24H  . furosemide  40 mg Intravenous Q8H  . loratadine  10 mg Oral Daily  . metoprolol  75 mg Oral BID  . pantoprazole  40 mg Oral Daily  . polyethylene glycol  17 g Oral Daily  . verapamil  240 mg Oral Daily  . Warfarin - Pharmacist Dosing Inpatient   Does not apply q1800    Physical Exam: General appearance: alert, cooperative and no distress JVD 7-8 Lungs: decreased breath sounds Rt base Heart: irregularly irregular rhythm 1/6 sem Abd soft BS+ Extrem: 1+ - 2+ pitting edema around ankles   Rate: 125  Rhythm: atrial fibrillation  Lab Results: No results found for this basename: WBC, HGB, PLT,  in the last 72 hours  Recent Labs  01/23/13 0340 01/24/13 0358  NA 139 138  K 3.4* 5.2*  CL 95* 96  CO2 38* 35*  GLUCOSE 98 106*  BUN 16 18  CREATININE 0.95 1.25*   No results found for this basename: TROPONINI, CK, MB,  in the last 72 hours Hepatic Function Panel No results found for this basename: PROT, ALBUMIN, AST, ALT, ALKPHOS, BILITOT, BILIDIR, IBILI,  in the last 72 hours No results found for this basename: CHOL,  in the last 72 hours  Recent Labs  01/24/13 0358  INR 2.30*   BNP (last 3 results)  Recent Labs  01/19/13 1709 01/21/13 0515  01/24/13 0358  PROBNP 4950.0* 2491.0* 1289.0*   Imaging: Imaging results have been reviewed  Cardiac Studies:  Assessment/Plan:   Principal Problem:   Acute diastolic CHF (congestive heart failure) Active Problems:   Atrial fibrillation, permanent   Recent Closed fracture of multiple pubic rami after fall at home. Sent to [facility] for rehab   Mitral valve regurgitation, mod. on TEE 2004, & mild to mod TR, EF 60%.   CKD (chronic kidney disease) stage 3, GFR 30-59 ml/min   HYPERTENSION   Long term (current) use of anticoagulants   Bilateral lower extremity edema    PLAN:  She has lost 8.9kg since admission. K+ today is up to 5.2. Beta blocker increased for HR yesterday.   [name] PA-C Beeper [phone] 01/24/2013, 8:52 AM   Patient seen and examined. Agree with assessment and plan. I/O -[phone] since admission; weight decreased from 179 to 160 lbs. Alkalosis improved with CO2 41 to 35 with diamox.BNP improved to 1289.   Will change IV lasix to oral 40 mg bid. AF rate 110 - 115. Increase lopressor today back to 100 mg bid. Keep in hospital today, ?possible dc 24 - 48 hrs.   Lennette Bihari, MD, [facility]  01/24/2013 9:06 AM

## 2013-01-24 NOTE — Progress Notes (Signed)
ANTICOAGULATION CONSULT NOTE - Follow Up Consult  Pharmacy Consult for Coumadin Indication: atrial fibrillation  Allergies  Allergen Reactions  . Pneumococcal Vaccines   . Atorvastatin Other (See Comments)    Severe Leg Cramps  . Ciprofloxacin Hcl Nausea And Vomiting  . Morphine Sulfate Nausea Only  . Penicillins     REACTION: unspecified  . Prednisone     unknown  . Sotalol Hcl   . Sulfonamide Derivatives Nausea Only    Patient Measurements: Height: 5\' 6"  (167.6 cm) Weight: 160 lb 4.4 oz (72.7 kg) IBW/kg (Calculated) : 59.3 Heparin Dosing Weight:   Vital Signs: Temp: 97.9 F (36.6 C) (06/13 0807) Temp src: Oral (06/13 0807) BP: 104/53 mmHg (06/13 1029) Pulse Rate: 121 (06/13 1029)  Labs:  Recent Labs  01/22/13 0400 01/23/13 0340 01/24/13 0358  LABPROT 25.8* 22.8* 24.3*  INR 2.50* 2.11* 2.30*  HEPARINUNFRC <0.10*  --   --   CREATININE 0.99 0.95 1.25*    Estimated Creatinine Clearance: 33.6 ml/min (by C-G formula based on Cr of 1.25).  Assessment: 87yof continuing on Coumadin for Afib. INR (2.3) remains therapeutic - will restart PTA Coumadin regimen (2.5mg  daily except 1.25mg  TTSun). - No CBC this AM  - No bleeding reported  Goal of Therapy:  INR 2-3   Plan:  1. Coumadin 2.5mg  po x 1 today 2. Follow-up AM INR  Cleon Dew [phone] 01/24/2013,1:11 PM

## 2013-01-24 NOTE — Progress Notes (Signed)
Clinical Social Worker spoke with Ninetta Lights at [facility] willing to consider pt for ST-SNF but will need to speak with pt's dtr.  CSW relayed message to pt's dtr and provided contact information to SNF.  Dtr to phone SNF.    Angelia Mould, MSW, [place] [phone]

## 2013-01-24 NOTE — Progress Notes (Signed)
Clinical Social Worker spoke with pt and pt's dtr at bedside.  Pt and dtr expressed interest in [facility] at dc as dtr has had positive experiences with [facility] in the past.  CSW submitted information to SNF for their review.  Dtr continues to pay for bed hold at [facility].    Angelia Mould, MSW, [place] [phone]

## 2013-01-24 NOTE — Progress Notes (Signed)
[facility] Progress Note [place] TEAM 1 - Stepdown/ICU TEAM   ORRIE LASCANO ZOX:[id number] DOB: 1925/05/07 DOA: 01/19/2013 PCP: Default, Provider, MD  Brief narrative: 79yo female with history of chronic atrial fibrillation on Coumadin. Recent discharge to skilled nursing facility on June 5 for rehabilitation following pelvic fracture. During the last hospitalization she developed acute renal failure and was treated for an Escherichia coli urinary tract infection. Since discharge she has developed increasing bilateral lower extremity edema as well as dyspnea on exertion that has been limiting her physical therapy at the nursing facility. She was sent to the emergency department. Chest x-ray did not demonstrate significant pulmonary edema but she was mildly hypoxic with saturation 96% on 2 L. Her atrial fibrillation was also uncontrolled with rapid ventricular response rates around 140.  Assessment/Plan:  Atrial fibrillation, permanent with RVR -rate inconsistently controlled on PO Cardizem - noted to have RVR with rates up to the 140's  (6/12) better but not resolved (6/13) so Cards increasing Lopressor back to 100 mg -appreciate Cards assistance -TNI negative  Acute respiratory failure with hypoxia due to Acute exacerbation of RHF  -suspect due to RVR/mild tachycardia related CM - ECHO revealed findings c/w RHF and associated moderate TR/mild pulmonary HTN-persistent RLE edema -initial CXR unrevealing  - IV Lasix changed to PO (6/13) -Cards dosing Diamox based on daily labs for metabolic alkalosis / despite persistent edema of ankles pt looks clinically dry and renal function continues to worsen - will check OVS esp since having tachycrdia  HYPERTENSION -controlled on usual meds -last admit ARB dc'd due to recent ARF  Hypokalemia -repleted by Cards- also repleting Mg given RVR  Recent Closed fracture of multiple pubic rami -once cardiac status stable will need PT/OT  eval -has pain with mobilization -will return to SNF for rehab stay -PT/OT   Bilateral lower extremity edema -tx as discussed above -improved  Long term (current) use of anticoagulants -INR sub therapeutic initially -IV Heparin-pharmacy managing  Mitral valve regurgitation, mod/Aortic stenosis (mild to moderate) -TEE 2004, & mild to mod TR, EF 60% -ECHO this admit (see below)  CKD (chronic kidney disease) stage 3, GFR 30-59 ml/min -GFR had actually initially improved compared to last admit but has decreased with aggressive diuresis so follow closely -would avoid ARB/ACE if possible  E. coli UTI (urinary tract infection) -Found last admit: did not tolerate Cipro (N/V) so SNF started Macrobid -repeat cx here polymicrobial but UA was STRONGLY positive so suspect cx blunted response to pre admit Macrobid therefore will continue anbx's -Empiric Rocephin started 6/11 -endorsed urinary incontinence since dc of foley last admit.  DVT prophylaxis: IV heparin >> coumadin Code Status: Full Family Communication: Patient at bedside Disposition Plan: Stepdown due to recurrent RVR-d/w pt and dtr and would like to see if [facility] will have a bed when pt medically stable for dc  Consultants: Cardiology  Procedures: 2-D echocardiogram  - Left ventricle: The cavity size was normal. Wall thickness was increased in a pattern of mild LVH. Systolic function was normal. The estimated ejection fraction was in the range of 55% to 60%. Wall motion was normal; there were no regional wall motion abnormalities. Doppler parameters are consistent with elevated mean left atrial filling pressure. - Aortic valve: There was mild to moderate stenosis. Mild regurgitation. Valve area: 1.21cm^2(VTI). Valve area: 1.32cm^2 (Vmax). - Mitral valve: Calcified annulus. Mildly thickened leaflets . Mild to moderate regurgitation directed centrally. - Left atrium: The atrium was moderately to  severely dilated. -  Right ventricle: The cavity size was mildly dilated. Wall thickness was normal. - Right atrium: The atrium was moderately dilated. - Tricuspid valve: Moderate regurgitation. - Pulmonary arteries: Systolic pressure was mildly increased. PA peak pressure: 43mm Hg (S).  Antibiotics: Rocephin 6/11 >>  HPI/Subjective: Patient alert and sad/crying because is worried over spending money to hold SNF bed - no CP or SOB  Objective: Blood pressure 104/53, pulse 121, temperature 97.9 F (36.6 C), temperature source Oral, resp. rate 21, height 5\' 6"  (1.676 m), weight 72.7 kg (160 lb 4.4 oz), SpO2 96.00%.  Intake/Output Summary (Last 24 hours) at 01/24/13 1332 Last data filed at 01/24/13 0900  Gross per 24 hour  Intake    240 ml  Output   1550 ml  Net  -1310 ml    Exam: General: No acute respiratory distress Lungs: Clear to auscultation bilaterally without wheezes or crackles, 2 L Cardiovascular: Irregular rate and rhythm with occ. RVR, no murmur gallop or rub normal S1 and S2, previous tight appearance to the peripheral edema of lower extremities and weeping has resolved . Right edema greater than left - estimated both around 1+ on right and limited to the ankles and left edema has resolved. Abdomen: Nontender, nondistended, soft, bowel sounds positive, no rebound, no ascites, no appreciable mass Musculoskeletal: No significant cyanosis, clubbing of bilateral lower extremities-has resolving ecchymosis posterior right thigh Neurological: Alert and oriented x 3, moves all extremities x 4 without focal neurological deficits although somewhat weaker right leg secondary to known recent pelvic fracture, CN 2-12 intact  Scheduled Meds: Scheduled Meds: . cefTRIAXone (ROCEPHIN)  IV  1 g Intravenous Q24H  . furosemide  40 mg Oral BID  . loratadine  10 mg Oral Daily  . metoprolol  75 mg Oral BID  . pantoprazole  40 mg Oral Daily  . polyethylene glycol  17 g Oral Daily  .  verapamil  240 mg Oral Daily  . warfarin  2.5 mg Oral ONCE-1800  . Warfarin - Pharmacist Dosing Inpatient   Does not apply q1800    Data Reviewed: Basic Metabolic Panel:  Recent Labs Lab 01/19/13 1709 01/19/13 2201 01/20/13 0250 01/21/13 0515 01/22/13 0400 01/23/13 0340 01/24/13 0358  NA 139  --  137 139 141 139 138  K 3.8  --  3.5 3.3* 3.8 3.4* 5.2*  CL 103  --  99 97 94* 95* 96  CO2 29  --  33* 37* 41* 38* 35*  GLUCOSE 96  --  96 96 101* 98 106*  BUN 15  --  13 13 16 16 18   CREATININE 0.80  --  0.76 0.83 0.99 0.95 1.25*  CALCIUM 8.1*  --  8.8 8.7 9.2 9.3 9.1  MG  --  1.8  --   --   --  1.8  --    Liver Function Tests:  Recent Labs Lab 01/19/13 1709  AST 25  ALT 22  ALKPHOS 78  BILITOT 1.0  PROT 5.3*  ALBUMIN 2.8*   CBC:  Recent Labs Lab 01/19/13 1709 01/20/13 0250  WBC 8.0 8.3  NEUTROABS 5.8  --   HGB 12.3 12.6  HCT 37.5 39.0  MCV 91.2 90.9  PLT 185 203   Cardiac Enzymes:  Recent Labs Lab 01/19/13 2201 01/20/13 0250 01/20/13 0820  TROPONINI <0.30 <0.30 <0.30   BNP (last 3 results)  Recent Labs  01/19/13 1709 01/21/13 0515 01/24/13 0358  PROBNP 4950.0* 2491.0* 1289.0*     Recent Results (from the past  240 hour(s))  MRSA PCR SCREENING     Status: [place]   Collection Time    01/19/13  9:21 PM      Result Value Range Status   MRSA by PCR NEGATIVE  NEGATIVE Final   Comment:            The GeneXpert MRSA Assay (FDA     approved for NASAL specimens     only), is one component of a     comprehensive MRSA colonization     surveillance program. It is not     intended to diagnose MRSA     infection nor to guide or     monitor treatment for     MRSA infections.  URINE CULTURE     Status: Salas   Collection Time    01/21/13  3:00 AM      Result Value Range Status   Specimen Description URINE, CLEAN CATCH   Final   Special Requests Sal[place]   Final   Culture  Setup Time 01/21/2013 04:24   Final   Colony Count 20,OOO COLONIES/ML   Final    Culture     Final   Value: Multiple bacterial morphotypes present, Salas predominant. Suggest appropriate recollection if clinically indicated.   Report Status 01/22/2013 FINAL   Final     Studies:  Recent x-ray studies have been reviewed in detail by the Attending Physician  Junious Silk, ANP [facility] Office  [phone] Pager [phone]  **If unable to reach the above provider after paging please contact the Flow Manager @ [phone]  On-Call/Text Page:      [email or web address]      password [email or web address]  If 7PM-7AM, please contact night-coverage [email or web address] Password [email or web address] 01/24/2013, 1:32 PM   LOS: 5 days   I have personally examined this patient and reviewed the entire database. I have reviewed the above note, made any necessary editorial changes, and agree with its content.  Lonia Blood, MD [facility]

## 2013-01-24 NOTE — Progress Notes (Signed)
Pt unable to complete orthostatic b/p (standing) at present. Will continue to re-evaluate when patient is able tolerate standing position to obtain VS.

## 2013-01-25 LAB — BASIC METABOLIC PANEL
BUN: 20 mg/dL (ref 6–23)
CO2: 29 mEq/L (ref 19–32)
Glucose, Bld: 94 mg/dL (ref 70–99)
Potassium: 3.9 mEq/L (ref 3.5–5.1)
Sodium: 136 mEq/L (ref 135–145)

## 2013-01-25 MED ORDER — METOPROLOL TARTRATE 100 MG PO TABS
100.0000 mg | ORAL_TABLET | Freq: Two times a day (BID) | ORAL | Status: DC
Start: 2013-01-25 — End: 2013-01-27
  Administered 2013-01-25 – 2013-01-27 (×4): 100 mg via ORAL
  Filled 2013-01-25 (×6): qty 1

## 2013-01-25 MED ORDER — WARFARIN SODIUM 2.5 MG PO TABS
2.5000 mg | ORAL_TABLET | Freq: Once | ORAL | Status: AC
  Administered 2013-01-25: 2.5 mg via ORAL
  Filled 2013-01-25 (×3): qty 1

## 2013-01-25 NOTE — Progress Notes (Signed)
[facility] Progress Note  TEAM 1 - Stepdown/ICU TEAM   ROSAISELA JAMROZ ZOX:[id number] DOB: 1925-05-10 DOA: 01/19/2013 PCP: Default, Provider, MD  Brief narrative: 79yo female with history of chronic atrial fibrillation on Coumadin. Recent discharge to skilled nursing facility on June 5 for rehabilitation following pelvic fracture. During the last hospitalization she developed acute renal failure and was treated for an Escherichia coli urinary tract infection. Since discharge she has developed increasing bilateral lower extremity edema as well as dyspnea on exertion that has been limiting her physical therapy at the nursing facility. She was sent to the emergency department. Chest x-ray did not demonstrate significant pulmonary edema but she was mildly hypoxic with saturation 96% on 2 L. Her atrial fibrillation was also uncontrolled with rapid ventricular response rates around 140.  Assessment/Plan:  Atrial fibrillation, permanent with RVR -rate inconsistently controlled on PO Cardizem - noted to have RVR with rates up to the 140's  (6/12) better but not resolved (6/13) so Cards increasing Lopressor back to 100 mg -appreciate Cards assistance -TNI negative -rate control is improving/stabilizing  Acute respiratory failure with hypoxia due to Acute exacerbation of RHF  -suspect due to RVR -ECHO revealed findings c/w RHF and associated moderate TR/mild pulmonary HTN - persistent RLE edema -initial CXR unrevealing  - IV Lasix changed to PO (6/13) -off diamox - agree that long term use not indicated   HYPERTENSION -controlled on usual meds -last admit ARB dc'd due to recent ARF  Hypokalemia -repleted   Recent Closed fracture of multiple pubic rami -will return to SNF for rehab stay -PT/OT ongoing  Bilateral lower extremity edema -tx as discussed above -improved  Long term (current) use of anticoagulants -INR sub therapeutic initially -IV Heparin>>coumdain w/ INR at  goal presently   Mitral valve regurgitation, mod/Aortic stenosis (mild to moderate) -TEE 2004, & mild to mod TR, EF 60% -ECHO this admit (see below)  CKD (chronic kidney disease) stage 3, GFR 30-59 ml/min -crt bumped slightly, but is now improving again -would avoid ARB/ACE if possible  E. coli UTI (urinary tract infection) -Found last admit: did not tolerate Cipro (N/V) so SNF started Macrobid -repeat cx here polymicrobial but UA was STRONGLY positive so suspect cx blunted response to pre admit Macrobid therefore will continue anbx's -Empiric Rocephin started 6/11  DVT prophylaxis: IV heparin >> coumadin Code Status: Full Family Communication: Patient at bedside Disposition Plan: transfer to tele - plan for d/c to SNF Monday  Consultants: Cardiology  Procedures: 2-D echocardiogram  - Left ventricle: The cavity size was normal. Wall thickness was increased in a pattern of mild LVH. Systolic function was normal. The estimated ejection fraction was in the range of 55% to 60%. Wall motion was normal; there were no regional wall motion abnormalities. Doppler parameters are consistent with elevated mean left atrial filling pressure. - Aortic valve: There was mild to moderate stenosis. Mild regurgitation. Valve area: 1.21cm^2(VTI). Valve area: 1.32cm^2 (Vmax). - Mitral valve: Calcified annulus. Mildly thickened leaflets . Mild to moderate regurgitation directed centrally. - Left atrium: The atrium was moderately to severely dilated. - Right ventricle: The cavity size was mildly dilated. Wall thickness was normal. - Right atrium: The atrium was moderately dilated. - Tricuspid valve: Moderate regurgitation. - Pulmonary arteries: Systolic pressure was mildly increased. PA peak pressure: 43mm Hg (S).  Antibiotics: Rocephin 6/11 >>  HPI/Subjective: Patient has no new complaints today.  She is in good spirits/quite happy.  She denies chest pain fevers or chills.  She  worked with  physical therapy this morning.  Objective: Blood pressure 125/71, pulse 109, temperature 98.1 F (36.7 C), temperature source Oral, resp. rate 21, height 5\' 6"  (1.676 m), weight 72.7 kg (160 lb 4.4 oz), SpO2 96.00%.  Intake/Output Summary (Last 24 hours) at 01/25/13 1158 Last data filed at 01/25/13 1000  Gross per 24 hour  Intake    840 ml  Output   1900 ml  Net  -1060 ml    Exam: General: No acute respiratory distress Lungs: Clear to auscultation bilaterally without wheezes or crackles, 2 L Cardiovascular: Irregularly irregular with rate 90 beats per minute Abdomen: Nontender, nondistended, soft, bowel sounds positive, no rebound, no ascites, no appreciable mass Musculoskeletal: No significant cyanosis, clubbing of bilateral lower extremities - 1+ bilateral lower extremity edema Neurological: Alert and oriented x 3, CN 2-12 intact  Scheduled Meds: Scheduled Meds: . cefTRIAXone (ROCEPHIN)  IV  1 g Intravenous Q24H  . furosemide  40 mg Oral BID  . loratadine  10 mg Oral Daily  . metoprolol  100 mg Oral BID  . pantoprazole  40 mg Oral Daily  . polyethylene glycol  17 g Oral Daily  . verapamil  240 mg Oral Daily  . warfarin  2.5 mg Oral ONCE-1800  . Warfarin - Pharmacist Dosing Inpatient   Does not apply q1800    Data Reviewed: Basic Metabolic Panel:  Recent Labs Lab 01/19/13 1709 01/19/13 2201  01/21/13 0515 01/22/13 0400 01/23/13 0340 01/24/13 0358 01/25/13 0400  NA 139  --   < > 139 141 139 138 136  K 3.8  --   < > 3.3* 3.8 3.4* 5.2* 3.9  CL 103  --   < > 97 94* 95* 96 98  CO2 29  --   < > 37* 41* 38* 35* 29  GLUCOSE 96  --   < > 96 101* 98 106* 94  BUN 15  --   < > 13 16 16 18 20   CREATININE 0.80  --   < > 0.83 0.99 0.95 1.25* 1.19*  CALCIUM 8.1*  --   < > 8.7 9.2 9.3 9.1 8.7  MG  --  1.8  --   --   --  1.8  --   --   < > = values in this interval not displayed. Liver Function Tests:  Recent Labs Lab 01/19/13 1709  AST 25  ALT 22  ALKPHOS 78  BILITOT  1.0  PROT 5.3*  ALBUMIN 2.8*   CBC:  Recent Labs Lab 01/19/13 1709 01/20/13 0250  WBC 8.0 8.3  NEUTROABS 5.8  --   HGB 12.3 12.6  HCT 37.5 39.0  MCV 91.2 90.9  PLT 185 203   Cardiac Enzymes:  Recent Labs Lab 01/19/13 2201 01/20/13 0250 01/20/13 0820  TROPONINI <0.30 <0.30 <0.30   BNP (last 3 results)  Recent Labs  01/19/13 1709 01/21/13 0515 01/24/13 0358  PROBNP 4950.0* 2491.0* 1289.0*     Recent Results (from the past 240 hour(s))  MRSA PCR SCREENING     Status: None   Collection Time    01/19/13  9:21 PM      Result Value Range Status   MRSA by PCR NEGATIVE  NEGATIVE Final   Comment:            The GeneXpert MRSA Assay (FDA     approved for NASAL specimens     only), is one component of a     comprehensive MRSA colonization  surveillance program. It is not     intended to diagnose MRSA     infection nor to guide or     monitor treatment for     MRSA infections.  URINE CULTURE     Status: None   Collection Time    01/21/13  3:00 AM      Result Value Range Status   Specimen Description URINE, CLEAN CATCH   Final   Special Requests NONE   Final   Culture  Setup Time 01/21/2013 04:24   Final   Colony Count 20,OOO COLONIES/ML   Final   Culture     Final   Value: Multiple bacterial morphotypes present, none predominant. Suggest appropriate recollection if clinically indicated.   Report Status 01/22/2013 FINAL   Final     Studies:  Recent x-ray studies have been reviewed in detail by the Attending Physician  Lonia Blood, MD [facility] Office  [phone] Pager [phone]  On-Call/Text Page:      [email or web address]      password [email or web address]  If 7PM-7AM, please contact night-coverage [email or web address] Password [facility] 01/25/2013, 11:58 AM   LOS: 6 days

## 2013-01-25 NOTE — Progress Notes (Signed)
Agree with above note

## 2013-01-25 NOTE — Progress Notes (Signed)
Pt. Admitted from [address] Step Down Unit, condition stable, Vital Signs Stable, No Changes in initial A.M. Assessment. Pt's heart rate dropped significantly from in the 140's to high 50's, low 60's. Pt. Is asymptomatic, continue with plan of care.

## 2013-01-25 NOTE — Progress Notes (Signed)
ANTICOAGULATION CONSULT NOTE - Follow Up Consult  Pharmacy Consult for Coumadin Indication: atrial fibrillation  Allergies  Allergen Reactions  . Pneumococcal Vaccines   . Atorvastatin Other (See Comments)    Severe Leg Cramps  . Ciprofloxacin Hcl Nausea And Vomiting  . Morphine Sulfate Nausea Only  . Penicillins     REACTION: unspecified  . Prednisone     unknown  . Sotalol Hcl   . Sulfonamide Derivatives Nausea Only    Patient Measurements: Height: 5\' 6"  (167.6 cm) Weight: 160 lb 4.4 oz (72.7 kg) IBW/kg (Calculated) : 59.3 Heparin Dosing Weight:   Vital Signs: Temp: 98.1 F (36.7 C) (06/14 0737) Temp src: Oral (06/14 0737) BP: 125/71 mmHg (06/14 0800) Pulse Rate: 109 (06/14 0800)  Labs:  Recent Labs  01/23/13 0340 01/24/13 0358 01/25/13 0400  LABPROT 22.8* 24.3* 26.9*  INR 2.11* 2.30* 2.64*  CREATININE 0.95 1.25* 1.19*    Estimated Creatinine Clearance: 35.3 ml/min (by C-G formula based on Cr of 1.19).  Assessment: Joanna Salas continuing on Coumadin for Afib. INR (2.64) remains therapeutic. PTA Coumadin regimen (2.5mg  daily except 1.25mg  TTSun). - No CBC this AM  - No bleeding reported  Goal of Therapy:  INR 2-3   Plan:  1. Coumadin 2.5mg  po x 1 today 2. Follow-up AM INR    Doris Cheadle, PharmD Clinical Pharmacist Pager: [phone] Phone: [phone] 01/25/2013 8:53 AM

## 2013-01-25 NOTE — Progress Notes (Signed)
Physical Therapy Treatment Patient Details Name: Joanna Salas MRN: [id number] DOB: 1924/11/12 Today's Date: 01/25/2013 Time: 4696-2952 PT Time Calculation (min): 24 min  PT Assessment / Plan / Recommendation Comments on Treatment Session  Pt able to increase ambulation distance this session with cues for upright posture.  Pt continues to plan to d/c to SNF.    Follow Up Recommendations  SNF     Does the patient have the potential to tolerate intense rehabilitation     Barriers to Discharge        Equipment Recommendations  Rolling walker with 5" wheels    Recommendations for Other Services    Frequency Min 3X/week   Plan Discharge plan remains appropriate;Frequency needs to be updated    Precautions / Restrictions Precautions Precautions: Fall Restrictions Weight Bearing Restrictions: Yes RLE Weight Bearing: Weight bearing as tolerated   Pertinent Vitals/Pain No c/o pain    Mobility  Bed Mobility Bed Mobility: Not assessed Transfers Transfers: Sit to Stand;Stand to Sit Sit to Stand: 4: Min assist;From chair/3-in-1 Stand to Sit: 3: Mod assist;To chair/3-in-1 Details for Transfer Assistance: (A) to initiate transfer with max cues for hand placement.  Pt incontinent on standing therefore placed pad between LE. Ambulation/Gait Ambulation/Gait Assistance: 4: Min assist Ambulation Distance (Feet): 15 Feet Assistive device: Rolling walker Ambulation/Gait Assistance Details: Pt ambulated 8' with RW and need to sit.  Pt sat ~ 2 minutes prior to ambulating 7' with RW. Gait Pattern: Step-through pattern;Decreased stride length;Decreased weight shift to right;Decreased weight shift to left;Decreased hip/knee flexion - right;Decreased hip/knee flexion - left Gait velocity: decreased, distance limited by fatigue/nausea Stairs: No Wheelchair Mobility Wheelchair Mobility: No    Exercises     PT Diagnosis:    PT Problem List:   PT Treatment Interventions:     PT  Goals Acute Rehab PT Goals PT Goal Formulation: With patient Time For Goal Achievement: 02/05/13 Potential to Achieve Goals: Good Pt will go Sit to Stand: with mod assist PT Goal: Sit to Stand - Progress: Met Pt will Ambulate: 16 - 50 feet;with mod assist;with least restrictive assistive device PT Goal: Ambulate - Progress: Progressing toward goal  Visit Information  Last PT Received On: 01/25/13 Assistance Needed: +1    Subjective Data  Subjective: "Sure I will walk. Are you by yourself?" Patient Stated Goal: to d/c back to SNF   Cognition  Cognition Arousal/Alertness: Awake/alert Behavior During Therapy: WFL for tasks assessed/performed Overall Cognitive Status: Within Functional Limits for tasks assessed    Balance  Static Standing Balance Static Standing - Balance Support: Bilateral upper extremity supported Static Standing - Level of Assistance: 4: Min assist Static Standing - Comment/# of Minutes: ~1 minute x2 with (A) to maintain balance and prevent posterior lean  End of Session PT - End of Session Equipment Utilized During Treatment: Gait belt Activity Tolerance: Patient limited by fatigue Patient left: in chair;with call bell/phone within reach Nurse Communication: Mobility status   GP     Arly Salminen 01/25/2013, 11:16 AM Jake Shark, PT DPT [phone]

## 2013-01-25 NOTE — Progress Notes (Signed)
Subjective:  Up in bed, no increased SOB  Objective:  Vital Signs in the last 24 hours: Temp:  [97.5 F (36.4 C)-98.7 F (37.1 C)] 98.1 F (36.7 C) (06/14 0737) Pulse Rate:  [57-123] 109 (06/14 0800) Resp:  [13-31] 21 (06/14 0800) BP: (93-132)/(46-81) 125/71 mmHg (06/14 0800) SpO2:  [91 %-99 %] 96 % (06/14 0800)  Intake/Output from previous day:  Intake/Output Summary (Last 24 hours) at 01/25/13 0905 Last data filed at 01/25/13 0700  Gross per 24 hour  Intake    840 ml  Output   1850 ml  Net  -1010 ml    Physical Exam: General appearance: alert, cooperative and no distress Lungs: clear to auscultation bilaterally Heart: irregularly irregular rhythm   Rate: 115  Rhythm: atrial fibrillation  Lab Results: No results found for this basename: WBC, HGB, PLT,  in the last 72 hours  Recent Labs  01/24/13 0358 01/25/13 0400  NA 138 136  K 5.2* 3.9  CL 96 98  CO2 35* 29  GLUCOSE 106* 94  BUN 18 20  CREATININE 1.25* 1.19*   No results found for this basename: TROPONINI, CK, MB,  in the last 72 hours Hepatic Function Panel No results found for this basename: PROT, ALBUMIN, AST, ALT, ALKPHOS, BILITOT, BILIDIR, IBILI,  in the last 72 hours No results found for this basename: CHOL,  in the last 72 hours  Recent Labs  01/25/13 0400  INR 2.64*    Imaging: Imaging results have been reviewed  Cardiac Studies:  Assessment/Plan:   Principal Problem:   Acute diastolic CHF (congestive heart failure) Active Problems:   Atrial fibrillation, permanent   Recent Closed fracture of multiple pubic rami after fall at home. Sent to [facility] for rehab   Mitral valve regurgitation, mod. on TEE 2004, & mild to mod TR, EF 60%.   CKD (chronic kidney disease) stage 3, GFR 30-59 ml/min   HYPERTENSION   Long term (current) use of anticoagulants   Bilateral lower extremity edema    PLAN: She continues to diurese. Lasix changed to po. Will increase Metoprolol to 100mg  BID.    [name] PA-C Beeper [phone] 01/25/2013, 9:05 AM   I have seen and examined the patient along with [name] PA-C.  I have reviewed the chart, notes and new data.  I agree with PA's note.  Key new complaints: she is very motivated to regain full mobility and independence, but is wobbly and tremulous secondary to deconditioning; no dyspnea Key examination changes: rate control is not perfect, but expect it will improve with recent beta blocker dose adjustment; no obvious signs of hypervolemia Key new findings / data: despite good diuresis, creatinine has not increased any further; therapeutic INR  PLAN: From a cardiac standpoint she is very close to discharge. She appears to be euvolemic or close to that.  I don't think diamox is necessary long term. Would discharge on once daily furosemide 40 mg.  Thurmon Fair, MD, [facility] [phone] 01/25/2013, 9:41 AM

## 2013-01-25 NOTE — Progress Notes (Signed)
Zofran given for nausea due to emesis after taking 6 pm medications. 6 pm medications were reordered by Wadie Lessen.

## 2013-01-25 NOTE — Progress Notes (Signed)
Pt HR is 56 with BP 92/44; pt sitting in chair; O2 98% on RA; pt asymptomatic; MD notified of HR and BP; will continue to monitor

## 2013-01-25 NOTE — Progress Notes (Signed)
Pt transported to 4705 by wheelchair; report given to RN at bedside; pt alert and oriented; family member notified of transfer; all questions answered; no complications noted

## 2013-01-26 LAB — BASIC METABOLIC PANEL
CO2: 31 mEq/L (ref 19–32)
Chloride: 100 mEq/L (ref 96–112)
Potassium: 3.1 mEq/L — ABNORMAL LOW (ref 3.5–5.1)
Sodium: 138 mEq/L (ref 135–145)

## 2013-01-26 LAB — CBC
HCT: 36.2 % (ref 36.0–46.0)
MCV: 91 fL (ref 78.0–100.0)
Platelets: 173 10*3/uL (ref 150–400)
RBC: 3.98 MIL/uL (ref 3.87–5.11)
WBC: 5.9 10*3/uL (ref 4.0–10.5)

## 2013-01-26 LAB — PROTIME-INR: INR: 2.67 — ABNORMAL HIGH (ref 0.00–1.49)

## 2013-01-26 MED ORDER — POTASSIUM CHLORIDE CRYS ER 20 MEQ PO TBCR
20.0000 meq | EXTENDED_RELEASE_TABLET | Freq: Two times a day (BID) | ORAL | Status: DC
  Administered 2013-01-26 – 2013-01-27 (×2): 20 meq via ORAL
  Filled 2013-01-26 (×3): qty 1

## 2013-01-26 MED ORDER — WARFARIN 1.25 MG HALF TABLET
1.2500 mg | ORAL_TABLET | Freq: Once | ORAL | Status: AC
  Administered 2013-01-26: 1.25 mg via ORAL
  Filled 2013-01-26: qty 1

## 2013-01-26 NOTE — Progress Notes (Signed)
ANTICOAGULATION CONSULT NOTE - Follow Up Consult  Pharmacy Consult for Warfarin Indication: atrial fibrillation  Allergies  Allergen Reactions  . Pneumococcal Vaccines   . Atorvastatin Other (See Comments)    Severe Leg Cramps  . Ciprofloxacin Hcl Nausea And Vomiting  . Morphine Sulfate Nausea Only  . Penicillins     REACTION: unspecified  . Prednisone     unknown  . Sotalol Hcl   . Sulfonamide Derivatives Nausea Only    Patient Measurements: Height: 5\' 6"  (167.6 cm) Weight: 163 lb 9.3 oz (74.2 kg) IBW/kg (Calculated) : 59.3  Vital Signs: Temp: 98.4 F (36.9 C) (06/15 0500) Temp src: Oral (06/15 0500) BP: 109/61 mmHg (06/15 0500) Pulse Rate: 106 (06/15 0500)  Labs:  Recent Labs  01/24/13 0358 01/25/13 0400 01/26/13 0430  HGB  --   --  11.5*  HCT  --   --  36.2  PLT  --   --  173  LABPROT 24.3* 26.9* 27.1*  INR 2.30* 2.64* 2.67*  CREATININE 1.25* 1.19* 1.03    Estimated Creatinine Clearance: 41.2 ml/min (by C-G formula based on Cr of 1.03).  Assessment: 87yof continuing on warfarin for Afib. INR (2.67) remains therapeutic. PTA warfarin regimen (2.5mg  daily except 1.25mg  TTSun). - CBC good this AM - No overt bleeding noted - Renal function stable with Scr 1.03  Goal of Therapy:  INR 2-3   Plan:  -Warfarin 1.25 mg po x 1 today (PTA regimen) -Daily PT/INR -Monitor for bleeding  Abran Duke, PharmD Clinical Pharmacist Phone: [phone] Pager: [phone] 01/26/2013 10:54 AM

## 2013-01-26 NOTE — Clinical Social Work Note (Signed)
Clinical Social Worker continuing to follow for support and discharge planning needs.  CSW spoke with patient daughter in the hallway who confirmed that she spoke with Wille Celeste at [facility] and confirmed bed offer.  CSW spoke with administrator at [facility] who is agreeable with patient potential discharge for Monday 06/16.  Patient daughter plans to go to facility at 10 am to complete paperwork.  CSW explained to patient daughter that discharge would more than likely happen after lunch if ready tomorrow due to paperwork completion - patient daughter understanding and agreeable.  CSW remains available for support and to facilitate patient discharge needs once medically stable.  Macario Golds, [place] [phone]  (Weekend Coverage Only)

## 2013-01-26 NOTE — Progress Notes (Signed)
[facility] Progress Note [place] TEAM 1 - Stepdown/ICU TEAM   MAKYNLEIGH BRESLIN ZOX:[id number] DOB: 08/08/1925 DOA: 01/19/2013 PCP: Default, Provider, MD  Brief narrative: 79yo female with history of chronic atrial fibrillation on Coumadin. Recent discharge to skilled nursing facility on June 5 for rehabilitation following pelvic fracture. During the last hospitalization she developed acute renal failure and was treated for an Escherichia coli urinary tract infection. Since discharge she has developed increasing bilateral lower extremity edema as well as dyspnea on exertion that has been limiting her physical therapy at the nursing facility. She was sent to the emergency department. Chest x-ray did not demonstrate significant pulmonary edema but she was mildly hypoxic with saturation 96% on 2 L. Her atrial fibrillation was also uncontrolled with rapid ventricular response rates around 140.  Assessment/Plan:  Atrial fibrillation, permanent with RVR -rate inconsistently controlled on PO Cardizem - noted to have RVR with rates up to the 140's (6/12) better but not resolved (6/13) so Cards increased Lopressor back to 100 mg -appreciate Cards assistance -TNI negative -rate control is improving/stabilizing  Acute respiratory failure with hypoxia due to Acute exacerbation of RHF  -suspect due to RVR -ECHO revealed findings c/w RHF and associated moderate TR/mild pulmonary HTN - persistent RLE edema -initial CXR unrevealing  - IV Lasix changed to PO (6/13) -off diamox - agree that long term use not indicated   HYPERTENSION -controlled on usual meds -last admit ARB dc'd due to recent ARF  Hypokalemia -recurring - cont to replace - f/u in AM   Recent Closed fracture of multiple pubic rami -will return to SNF for rehab stay in AM -PT/OT ongoing  Bilateral lower extremity edema -tx as discussed above -improved  Long term (current) use of anticoagulants -INR sub therapeutic  initially -IV Heparin>>coumdain w/ INR at goal presently   Mitral valve regurgitation, mod/Aortic stenosis (mild to moderate) -TEE 2004, & mild to mod TR, EF 60% -ECHO this admit (see below)  CKD (chronic kidney disease) stage 3, GFR 30-59 ml/min -crt bumped slightly, but is now improving again -would avoid ARB/ACE   E. coli UTI (urinary tract infection) -Found last admit: did not tolerate Cipro (N/V) so SNF started Macrobid -repeat cx here polymicrobial but UA was STRONGLY positive so suspect cx blunted response to pre admit Macrobid therefore will continue anbx's -Empiric Rocephin started 6/11  DVT prophylaxis: IV heparin >> coumadin Code Status: Full Family Communication: Patient at bedside Disposition Plan: plan for d/c to SNF Monday  Consultants: Cardiology  Procedures: 2-D echocardiogram  - Left ventricle: The cavity size was normal. Wall thickness was increased in a pattern of mild LVH. Systolic function was normal. The estimated ejection fraction was in the range of 55% to 60%. Wall motion was normal; there were no regional wall motion abnormalities. Doppler parameters are consistent with elevated mean left atrial filling pressure. - Aortic valve: There was mild to moderate stenosis. Mild regurgitation. Valve area: 1.21cm^2(VTI). Valve area: 1.32cm^2 (Vmax). - Mitral valve: Calcified annulus. Mildly thickened leaflets . Mild to moderate regurgitation directed centrally. - Left atrium: The atrium was moderately to severely dilated. - Right ventricle: The cavity size was mildly dilated. Wall thickness was normal. - Right atrium: The atrium was moderately dilated. - Tricuspid valve: Moderate regurgitation. - Pulmonary arteries: Systolic pressure was mildly increased. PA peak pressure: 43mm Hg (S).  Antibiotics: Rocephin 6/11 >>6/15  HPI/Subjective: Pt is resting comfortably this evening.  She has no new complaints, and denies sob or chest  pain.  Objective: Blood pressure 89/45, pulse 60, temperature 97 F (36.1 C), temperature source Axillary, resp. rate 18, height 5\' 6"  (1.676 m), weight 74.2 kg (163 lb 9.3 oz), SpO2 95.00%.  Intake/Output Summary (Last 24 hours) at 01/26/13 1718 Last data filed at 01/26/13 1515  Gross per 24 hour  Intake   1760 ml  Output    325 ml  Net   1435 ml    Exam: General: No acute respiratory distress Lungs: Clear to auscultation bilaterally without wheezes or crackles, 2 L Cardiovascular: Irregularly irregular with rate 90 beats per minute Abdomen: Nontender, nondistended, soft, bowel sounds positive, no rebound, no ascites, no appreciable mass Musculoskeletal: No significant cyanosis, clubbing of bilateral lower extremities - 1+ bilateral lower extremity edema Neurological: Alert and oriented x 3, CN 2-12 intact  Scheduled Meds: Scheduled Meds: . cefTRIAXone (ROCEPHIN)  IV  1 g Intravenous Q24H  . furosemide  40 mg Oral BID  . loratadine  10 mg Oral Daily  . metoprolol  100 mg Oral BID  . pantoprazole  40 mg Oral Daily  . polyethylene glycol  17 g Oral Daily  . verapamil  240 mg Oral Daily  . warfarin  1.25 mg Oral ONCE-1800  . Warfarin - Pharmacist Dosing Inpatient   Does not apply q1800    Data Reviewed: Basic Metabolic Panel:  Recent Labs Lab 01/19/13 2201  01/22/13 0400 01/23/13 0340 01/24/13 0358 01/25/13 0400 01/26/13 0430  NA  --   < > 141 139 138 136 138  K  --   < > 3.8 3.4* 5.2* 3.9 3.1*  CL  --   < > 94* 95* 96 98 100  CO2  --   < > 41* 38* 35* 29 31  GLUCOSE  --   < > 101* 98 106* 94 95  BUN  --   < > 16 16 18 20 18   CREATININE  --   < > 0.99 0.95 1.25* 1.19* 1.03  CALCIUM  --   < > 9.2 9.3 9.1 8.7 8.7  MG 1.8  --   --  1.8  --   --   --   < > = values in this interval not displayed. Liver Function Tests: No results found for this basename: AST, ALT, ALKPHOS, BILITOT, PROT, ALBUMIN,  in the last 168 hours CBC:  Recent Labs Lab 01/20/13 0250  01/26/13 0430  WBC 8.3 5.9  HGB 12.6 11.5*  HCT 39.0 36.2  MCV 90.9 91.0  PLT 203 173   Cardiac Enzymes:  Recent Labs Lab 01/19/13 2201 01/20/13 0250 01/20/13 0820  TROPONINI <0.30 <0.30 <0.30   BNP (last 3 results)  Recent Labs  01/19/13 1709 01/21/13 0515 01/24/13 0358  PROBNP 4950.0* 2491.0* 1289.0*     Recent Results (from the past 240 hour(s))  MRSA PCR SCREENING     Status: None   Collection Time    01/19/13  9:21 PM      Result Value Range Status   MRSA by PCR NEGATIVE  NEGATIVE Final   Comment:            The GeneXpert MRSA Assay (FDA     approved for NASAL specimens     only), is one component of a     comprehensive MRSA colonization     surveillance program. It is not     intended to diagnose MRSA     infection nor to guide or     monitor treatment for  MRSA infections.  URINE CULTURE     Status: None   Collection Time    01/21/13  3:00 AM      Result Value Range Status   Specimen Description URINE, CLEAN CATCH   Final   Special Requests NONE   Final   Culture  Setup Time 01/21/2013 04:24   Final   Colony Count 20,OOO COLONIES/ML   Final   Culture     Final   Value: Multiple bacterial morphotypes present, none predominant. Suggest appropriate recollection if clinically indicated.   Report Status 01/22/2013 FINAL   Final     Studies:  Recent x-ray studies have been reviewed in detail by the Attending Physician  Lonia Blood, MD [facility] Office  [phone] Pager [phone]  On-Call/Text Page:      [email or web address]      password [email or web address]  If 7PM-7AM, please contact night-coverage [email or web address] Password [email or web address] 01/26/2013, 5:18 PM   LOS: 7 days

## 2013-01-26 NOTE — Progress Notes (Signed)
Pt. Has not had anymore complaints of nausea throughout the night.  She stated that she thinks it was from a pill getting stuck in her throat earlier.  Pt. Took subsequent medications with applesauce and has not had any issues.  Will continue to monitor.

## 2013-01-26 NOTE — Progress Notes (Signed)
Subjective:  No SOB. Vomited yesterday after she "choked on a pill". She had received IV fluids at the nursing home prior to this admission because she got nauseated there.   Objective:  Vital Signs in the last 24 hours: Temp:  [98 F (36.7 C)-98.9 F (37.2 C)] 98.4 F (36.9 C) (06/15 0500) Pulse Rate:  [56-109] 106 (06/15 0500) Resp:  [20-27] 20 (06/15 0500) BP: (93-111)/(60-65) 109/61 mmHg (06/15 0500) SpO2:  [92 %-96 %] 92 % (06/15 0500) Weight:  [74.2 kg (163 lb 9.3 oz)] 74.2 kg (163 lb 9.3 oz) (06/15 0500)  Intake/Output from previous day:  Intake/Output Summary (Last 24 hours) at 01/26/13 0854 Last data filed at 01/25/13 2212  Gross per 24 hour  Intake    600 ml  Output   1176 ml  Net   -576 ml    Physical Exam: General appearance: alert, cooperative and no distress Lungs: clear to auscultation bilaterally Heart: irregularly irregular rhythm   Rate: 98  Rhythm: atrial fibrillation  Lab Results:  Recent Labs  01/26/13 0430  WBC 5.9  HGB 11.5*  PLT 173    Recent Labs  01/25/13 0400 01/26/13 0430  NA 136 138  K 3.9 3.1*  CL 98 100  CO2 29 31  GLUCOSE 94 95  BUN 20 18  CREATININE 1.19* 1.03   No results found for this basename: TROPONINI, CK, MB,  in the last 72 hours Hepatic Function Panel No results found for this basename: PROT, ALBUMIN, AST, ALT, ALKPHOS, BILITOT, BILIDIR, IBILI,  in the last 72 hours No results found for this basename: CHOL,  in the last 72 hours  Recent Labs  01/26/13 0430  INR 2.67*    Imaging: Imaging results have been reviewed  Cardiac Studies:  Assessment/Plan:   Principal Problem:   Acute diastolic CHF (congestive heart failure) Active Problems:   Atrial fibrillation, permanent   Recent Closed fracture of multiple pubic rami after fall at home. Sent to [facility] for rehab   Mitral valve regurgitation, mod. on TEE 2004, & mild to mod TR, EF 60%.   CKD (chronic kidney disease) stage 3, GFR 30-59 ml/min  HYPERTENSION   Long term (current) use of anticoagulants   Bilateral lower extremity edema    PLAN:  Her HR is still 110 but she has not received morning Metoprolol. She will probably be ready to return to Rehab/SNF in am. Check BNP in am. Decrease Lasix to 40mg  daily at discharge.  [name] PA-C Beeper [phone] 01/26/2013, 8:54 AM   I have seen and evaluated the patient this AM along with [name], PA. I agree with his findings, examination as well as impression recommendations.  Seems pretty stable overall - monitor HR once BB given. Agree with decreasing lasix for d/c   MD Time with pt: 5 min  Deloise Marchant W, M.D., M.S. [facility] [address] [place], [place]  [address]  [phone] Pager # [phone] 01/26/2013 10:21 AM

## 2013-01-27 LAB — BASIC METABOLIC PANEL
GFR calc non Af Amer: 39 mL/min — ABNORMAL LOW (ref >90)
Glucose, Bld: 88 mg/dL (ref 70–99)
Potassium: 3.8 mEq/L (ref 3.5–5.1)
Sodium: 139 mEq/L (ref 135–145)

## 2013-01-27 LAB — PROTIME-INR: INR: 3.25 — ABNORMAL HIGH (ref 0.00–1.49)

## 2013-01-27 LAB — PRO B NATRIURETIC PEPTIDE: Pro B Natriuretic peptide (BNP): 1838 pg/mL — ABNORMAL HIGH (ref 0–450)

## 2013-01-27 MED ORDER — FUROSEMIDE 40 MG PO TABS: 40.0000 mg | ORAL_TABLET | Freq: Every day | ORAL | Status: DC

## 2013-01-27 MED ORDER — SENNOSIDES-DOCUSATE SODIUM 8.6-50 MG PO TABS: 1.0000 | ORAL_TABLET | Freq: Every evening | ORAL | Status: DC | PRN

## 2013-01-27 MED ORDER — POTASSIUM CHLORIDE CRYS ER 20 MEQ PO TBCR: 20.0000 meq | EXTENDED_RELEASE_TABLET | Freq: Two times a day (BID) | ORAL | Status: DC

## 2013-01-27 MED ORDER — VERAPAMIL HCL ER 120 MG PO TBCR
120.0000 mg | EXTENDED_RELEASE_TABLET | Freq: Two times a day (BID) | ORAL | Status: DC
  Administered 2013-01-27: 120 mg via ORAL
  Filled 2013-01-27 (×2): qty 1

## 2013-01-27 MED ORDER — WARFARIN 0.5 MG HALF TABLET
0.5000 mg | ORAL_TABLET | Freq: Once | ORAL | Status: DC
  Filled 2013-01-27: qty 1

## 2013-01-27 MED ORDER — WARFARIN - PHARMACIST DOSING INPATIENT: Status: DC

## 2013-01-27 NOTE — Progress Notes (Addendum)
Subjective:  Up in chair, no nausea or SOB  Objective:  Vital Signs in the last 24 hours: Temp:  [97 F (36.1 C)-97.2 F (36.2 C)] 97.2 F (36.2 C) (06/16 0500) Pulse Rate:  [60-102] 102 (06/16 0500) Resp:  [18-20] 20 (06/16 0500) BP: (89-107)/(45-70) 105/52 mmHg (06/16 0500) SpO2:  [92 %-95 %] 92 % (06/16 0500) Weight:  [73.6 kg (162 lb 4.1 oz)] 73.6 kg (162 lb 4.1 oz) (06/16 0500)  Intake/Output from previous day:  Intake/Output Summary (Last 24 hours) at 01/27/13 0846 Last data filed at 01/27/13 0819  Gross per 24 hour  Intake   1520 ml  Output    275 ml  Net   1245 ml    Physical Exam: General appearance: alert, cooperative and no distress Lungs: clear to auscultation bilaterally Heart: irregularly irregular rhythm Extrem: No edema   Rate: 100-110  Rhythm: atrial fibrillation  Lab Results:  Recent Labs  01/26/13 0430  WBC 5.9  HGB 11.5*  PLT 173    Recent Labs  01/26/13 0430 01/27/13 0400  NA 138 139  K 3.1* 3.8  CL 100 99  CO2 31 32  GLUCOSE 95 88  BUN 18 20  CREATININE 1.03 1.22*   No results found for this basename: TROPONINI, CK, MB,  in the last 72 hours Hepatic Function Panel No results found for this basename: PROT, ALBUMIN, AST, ALT, ALKPHOS, BILITOT, BILIDIR, IBILI,  in the last 72 hours No results found for this basename: CHOL,  in the last 72 hours  Recent Labs  01/27/13 0400  INR 3.25*    Imaging: Imaging results have been reviewed  Cardiac Studies:  Assessment/Plan:   Principal Problem:   Acute diastolic CHF (congestive heart failure) Active Problems:   Atrial fibrillation, permanent   Recent Closed fracture of multiple pubic rami after fall at home. Sent to [facility] for rehab   Mitral valve regurgitation, mod. on TEE 2004, & mild to mod TR, EF 60%.   CKD (chronic kidney disease) stage 3, GFR 30-59 ml/min   HYPERTENSION   Long term (current) use of anticoagulants   Bilateral lower extremity edema    PLAN: Her  rate is still a little fast despite Metoprolol 100mg  BID and Calan Sr 240. No CHF on exam. OK to go to rehab on current meds.. Decrease Lasix to 40mg  daily. We will arrange for follow up.  [name] PA-C Beeper [phone] 01/27/2013, 8:46 AM   I have seen and evaluated the patient this AM along with [name], PA. I agree with his findings, examination as well as impression recommendations.  Has recovered from post hydration acute HF exaccerbation.  Back to stable diuretic dose.  HR reasonably well controlled on BB & CCB - I am reluctant to increase doses for fear of bradycardic response. No anticoagulation per previous notes -with fall risk.  MD Time with pt: 5 min  HARDING,DAVID W, M.D., M.S. [facility] [address] [place], [place]  [address]  [phone] Pager # [phone] 01/27/2013 10:15 AM

## 2013-01-27 NOTE — Progress Notes (Addendum)
Pt d/c to SNF, Report call to Nursing Lurena Joiner. All nurse's questions  Answer questions.

## 2013-01-27 NOTE — Discharge Summary (Signed)
Physician Discharge Summary  Joanna Salas QMV:[id number] DOB: 04-Jun-1925 DOA: 01/19/2013  PCP: Default, Provider, MD  Admit date: 01/19/2013 Discharge date: 01/27/2013  Time spent: >35 minutes  Recommendations for Outpatient Follow-up:  1. Follow up with Cardiology - see below 2. Continue PT/OT for recent hip fracture 3. Recommend electrolyte panel in one week 4. Routine monitoring of INR  Discharge Diagnoses:    Acute diastolic CHF (congestive heart failure) and NEW right heart failure   Atrial fibrillation, permanent with recurrent RVR    HYPERTENSION   Recent Closed fracture of multiple pubic rami after fall at home- continue rehab   Bilateral lower extremity edema   Long term (current) use of anticoagulants   Mitral valve regurgitation, mod. on TEE 2004, & mild to mod TR, EF 60%.   CKD (chronic kidney disease) stage 3, GFR 30-59 ml/min   Discharge Condition: stable  Diet recommendation: Heart Healthy with 2000 cc fluid restriction and 2 gm sodium restriction  Filed Weights   01/24/13 0455 01/26/13 0500 01/27/13 0500  Weight: 72.7 kg (160 lb 4.4 oz) 74.2 kg (163 lb 9.3 oz) 73.6 kg (162 lb 4.1 oz)    History of present illness:  79yo female with history of chronic atrial fibrillation on Coumadin. Recent discharge to skilled nursing facility on June 5 for rehabilitation following pelvic fracture. During the last hospitalization she developed acute renal failure and was treated for an Escherichia coli urinary tract infection. Since discharge she developed increasing bilateral lower extremity edema as well as dyspnea on exertion that had been limiting her physical therapy at the nursing facility. She was sent to the emergency department. Chest x-ray did not demonstrate significant pulmonary edema but she was mildly hypoxic with saturation 96% on 2 L. Her atrial fibrillation was uncontrolled with rapid ventricular response rates around 140.   Hospital Course:   Atrial  fibrillation, permanent with RVR  -rate initially controlled on IV Cardizem - had recurrent RVR with rates up to the 140's -Cardiology was consulted and made adjustments in meds -eventually rate best controlled on PO Cardizem and Lopressor although has mild persistent HR in the low 100"s -had issues with bradycardia so opted not to further up titrate beta blockers -Cardiac enzyme and EKG were non ischemic  Acute respiratory failure with hypoxia due to Acute exacerbation of RHF  -suspect due to RVR  -ECHO this admit revealed findings c/w RHF and associated moderate TR/mild pulmonary HTN - persistent RLE edema  -initial CXR unrevealing  - Diuresed aggressively with IV Lasix which was changed to PO (6/13)  -total of 6679 cc diuresed this admit with ending weight of 73.6 kg -continue Lasix with Kdur after discharge -Diamox was utilized acutely to promote best diuresis in setting of metabolic alkalosis- agree that long term use not indicated   HYPERTENSION  -controlled on usual meds  -last admit ARB dc'd due to recent ARF   Hypokalemia  -replaced - follow up in outpt setting suggested  Recent Closed fracture of multiple pubic rami  -return to SNF for rehab with long term plan to return to home -PT/OT ongoing   Bilateral lower extremity edema  -tx as discussed above  -improved   Long term (current) use of anticoagulants  -INR sub therapeutic initially  -IV Heparin>>coumdain w/ INR at goal at time of d/c  (3.3)  Mitral valve regurgitation, mod/Aortic stenosis (mild to moderate)  -TEE 2004, & mild to mod TR, EF 60%  -ECHO this admit (see below)   CKD (  chronic kidney disease) stage 3, GFR 30-59 ml/min  -crt 1.22 at discharge -would avoid ARB/ACE   E. coli UTI (urinary tract infection)  -Found last admit: did not tolerate Cipro (N/V) so SNF started Macrobid  -repeat cx here polymicrobial but UA was STRONGLY positive so suspect cx with blunted response to pre admit Macrobid  therefore continued anbx's  -Empiric Rocephin started 6/11and last dose 6/15   Procedures: 2-D echocardiogram  - Left ventricle: The cavity size was normal. Wall thickness was increased in a pattern of mild LVH. Systolic function was normal. The estimated ejection fraction was in the range of 55% to 60%. Wall motion was normal; there were no regional wall motion abnormalities. Doppler parameters are consistent with elevated mean left atrial filling pressure. - Aortic valve: There was mild to moderate stenosis. Mild regurgitation. Valve area: 1.21cm^2(VTI). Valve area: 1.32cm^2 (Vmax). - Mitral valve: Calcified annulus. Mildly thickened leaflets . Mild to moderate regurgitation directed centrally. - Left atrium: The atrium was moderately to severely dilated. - Right ventricle: The cavity size was mildly dilated. Wall thickness was normal. - Right atrium: The atrium was moderately dilated. - Tricuspid valve: Moderate regurgitation. - Pulmonary arteries: Systolic pressure was mildly increased. PA peak pressure: 43mm Hg (S).   Consultations: Cardiology - [facility]   Discharge Exam: Filed Vitals:   01/27/13 0500 01/27/13 0900 01/27/13 1128 01/27/13 1131  BP: 105/52 96/63 90/59  92/60  Pulse: 102 108 85   Temp: 97.2 F (36.2 C)     TempSrc: Oral     Resp: 20 18    Height:      Weight: 73.6 kg (162 lb 4.1 oz)     SpO2: 92% 95%     General: No acute respiratory distress  Lungs: Clear to auscultation bilaterally without wheezes or crackles, 2 L  Cardiovascular: Irregularly irregular with rate 90 beats per minute  Abdomen: Nontender, nondistended, soft, bowel sounds positive, no rebound, no ascites, no appreciable mass  Musculoskeletal: No significant cyanosis, clubbing of bilateral lower extremities - 1+ bilateral lower extremity edema  Neurological: Alert and oriented x 3, CN 2-12 intact   Discharge Instructions      Discharge Orders   Future Appointments Provider  Department Dept Phone   02/03/2013 10:15 AM Waymon Budge, MD [facility] [phone]   02/10/2013 11:20 AM Abelino Derrick, PA-C [facility] [place] [phone]   Future Orders Complete By Expires     Diet - low sodium heart healthy  As directed     Scheduling Instructions:      2000 cc fluid restriction    Increase activity slowly  As directed     Scheduling Instructions:      Continue PT/OT        Medication List    STOP taking these medications       ciprofloxacin 500 MG tablet  Commonly known as:  CIPRO      TAKE these medications       acetaminophen 500 MG tablet  Commonly known as:  TYLENOL  Take 1,000 mg by mouth every 6 (six) hours as needed for pain.     albuterol 108 (90 BASE) MCG/ACT inhaler  Commonly known as:  PROAIR HFA  Inhale 2 puffs into the lungs every 4 (four) hours as needed for wheezing or shortness of breath.     ALPRAZolam 0.25 MG tablet  Commonly known as:  XANAX  Take 1 tablet (0.25 mg total) by mouth at bedtime as  needed.     alum & mag hydroxide-simeth 200-200-20 MG/5ML suspension  Commonly known as:  MAALOX/MYLANTA  Take 15 mLs by mouth every 4 (four) hours as needed.     cetirizine 10 MG tablet  Commonly known as:  ZYRTEC  Take 10 mg by mouth at bedtime.     furosemide 40 MG tablet  Commonly known as:  LASIX  Take 1 tablet (40 mg total) by mouth daily.  Start taking on:  01/28/2013     meclizine 25 MG tablet  Commonly known as:  ANTIVERT  Take 25-50 mg by mouth 3 (three) times daily as needed for dizziness.     metoprolol 100 MG tablet  Commonly known as:  LOPRESSOR  Take 1 tablet (100 mg total) by mouth 2 (two) times daily.     pantoprazole 40 MG tablet  Commonly known as:  PROTONIX  Take 1 tablet (40 mg total) by mouth daily.     polyethylene glycol packet  Commonly known as:  MIRALAX / GLYCOLAX  Take 17 g by mouth daily.     potassium chloride SA 20 MEQ tablet  Commonly known as:   K-DUR,KLOR-CON  Take 1 tablet (20 mEq total) by mouth 2 (two) times daily.     promethazine 12.5 MG tablet  Commonly known as:  PHENERGAN  Take 12.5-25 mg by mouth at bedtime as needed for nausea (dizziness).     raloxifene 60 MG tablet  Commonly known as:  EVISTA  Take 60 mg by mouth daily.     senna-docusate 8.6-50 MG per tablet  Commonly known as:  Senokot-S  Take 1 tablet by mouth at bedtime as needed.     traMADol 50 MG tablet  Commonly known as:  ULTRAM  Take 1 tablet (50 mg total) by mouth every 6 (six) hours as needed for pain.     verapamil 240 MG 24 hr capsule  Commonly known as:  VERELAN PM  Take 240 mg by mouth daily.     Warfarin - Pharmacist Dosing Inpatient Misc  Continue outpatient coumadin monitoring per pharmacist     warfarin 2.5 MG tablet  Commonly known as:  COUMADIN  Take 1.25-2.5 mg by mouth daily. 0.5 tab on Sun, Tues, and Thurs; 1 tab all other days.       Allergies  Allergen Reactions  . Pneumococcal Vaccines   . Atorvastatin Other (See Comments)    Severe Leg Cramps  . Ciprofloxacin Hcl Nausea And Vomiting  . Morphine Sulfate Nausea Only  . Penicillins     REACTION: unspecified  . Prednisone     unknown  . Sotalol Hcl   . Sulfonamide Derivatives Nausea Only   Follow-up Information   Follow up with Abelino Derrick, PA-C On 02/10/2013. (11:20)    Contact information:   [address] [place] [address] [phone]      Labs: Basic Metabolic Panel:  Recent Labs Lab 01/23/13 0340 01/24/13 0358 01/25/13 0400 01/26/13 0430 01/27/13 0400  NA 139 138 136 138 139  K 3.4* 5.2* 3.9 3.1* 3.8  CL 95* 96 98 100 99  CO2 38* 35* 29 31 32  GLUCOSE 98 106* 94 95 88  BUN 16 18 20 18 20   CREATININE 0.95 1.25* 1.19* 1.03 1.22*  CALCIUM 9.3 9.1 8.7 8.7 8.5  MG 1.8  --   --   --   --    CBC:  Recent Labs Lab 01/26/13 0430  WBC 5.9  HGB 11.5*  HCT 36.2  MCV 91.0  PLT 173   Signed:  ELLIS,ALLISON L. ANP [facility] 01/27/2013, 11:48 AM  I have personally examined this patient and reviewed the entire database. I have reviewed the above note, made any necessary editorial changes, and agree with its content.  Lonia Blood, MD [facility]

## 2013-01-27 NOTE — Progress Notes (Signed)
Patient had complaints of pain to pelvic area from recent fall w/pelvic fracture.  PRN Tylenol given and patient repositioned.  Patient currently resting comfortably, will continue to monitor. Troy Sine

## 2013-01-27 NOTE — Progress Notes (Signed)
Physical Therapy Treatment Patient Details Name: Joanna Salas MRN: [id number] DOB: 1925/07/06 Today's Date: 01/27/2013 Time: 5284-1324 PT Time Calculation (min): 25 min  PT Assessment / Plan / Recommendation Comments on Treatment Session   Pt making slow but steady progress.     Follow Up Recommendations  SNF     Does the patient have the potential to tolerate intense rehabilitation     Barriers to Discharge        Equipment Recommendations  Rolling walker with 5" wheels    Recommendations for Other Services    Frequency Min 3X/week   Plan Discharge plan remains appropriate;Frequency needs to be updated    Precautions / Restrictions Precautions Precautions: Fall Restrictions RLE Weight Bearing: Weight bearing as tolerated       Mobility  Bed Mobility Bed Mobility: Supine to Sit;Sitting - Scoot to Edge of Bed Supine to Sit: 4: Min guard;HOB elevated;With rails Sitting - Scoot to Edge of Bed: 4: Min guard Transfers Transfers: Sit to Stand;Stand to Sit Sit to Stand: 4: Min assist;With upper extremity assist;From bed;From chair/3-in-1;With armrests Stand to Sit: 4: Min assist Details for Transfer Assistance: cues for hand placement & technique.  (A) to achieve standing, anterior weight shifting forwards over BOS, & controlled descent.   Ambulation/Gait Ambulation/Gait Assistance: 4: Min assist Ambulation Distance (Feet): 20 Feet Assistive device: Rolling walker Ambulation/Gait Assistance Details: (A) for balance & safety.  Pt's UE's shaky & relies heavily on RW for support.   Gait Pattern: Step-through pattern;Decreased stride length Gait velocity: decreased Stairs: No Wheelchair Mobility Wheelchair Mobility: No      PT Goals Acute Rehab PT Goals Time For Goal Achievement: 02/05/13 Potential to Achieve Goals: Good Pt will go Supine/Side to Sit: with mod assist PT Goal: Supine/Side to Sit - Progress: Met Pt will go Sit to Stand: with mod assist Pt will  Ambulate: 16 - 50 feet;with mod assist;with least restrictive assistive device PT Goal: Ambulate - Progress: Partly met  Visit Information  Last PT Received On: 01/27/13 Assistance Needed: +1    Subjective Data      Cognition  Cognition Arousal/Alertness: Awake/alert Behavior During Therapy: WFL for tasks assessed/performed Overall Cognitive Status: Within Functional Limits for tasks assessed    Balance  Static Standing Balance Static Standing - Balance Support: During functional activity Static Standing - Level of Assistance: 4: Min assist Static Standing - Comment/# of Minutes: (A) for balance while standing at sink washing hands & face.  Pt bracing herself against countertop for stability as well at times.    End of Session PT - End of Session Equipment Utilized During Treatment: Gait belt Activity Tolerance: Patient tolerated treatment well Patient left: in chair;with call bell/phone within reach Nurse Communication: Mobility status     Verdell Face, [place] [phone] 01/27/2013

## 2013-01-27 NOTE — Progress Notes (Signed)
ANTICOAGULATION CONSULT NOTE - Follow Up Consult  Pharmacy Consult for Warfarin Indication: atrial fibrillation  Allergies  Allergen Reactions  . Pneumococcal Vaccines   . Atorvastatin Other (See Comments)    Severe Leg Cramps  . Ciprofloxacin Hcl Nausea And Vomiting  . Morphine Sulfate Nausea Only  . Penicillins     REACTION: unspecified  . Prednisone     unknown  . Sotalol Hcl   . Sulfonamide Derivatives Nausea Only    Patient Measurements: Height: 5\' 6"  (167.6 cm) Weight: 162 lb 4.1 oz (73.6 kg) IBW/kg (Calculated) : 59.3  Vital Signs: Temp: 97.2 F (36.2 C) (06/16 0500) Temp src: Oral (06/16 0500) BP: 100/57 mmHg (06/16 1404) Pulse Rate: 85 (06/16 1128)  Labs:  Recent Labs  01/25/13 0400 01/26/13 0430 01/27/13 0400  HGB  --  11.5*  --   HCT  --  36.2  --   PLT  --  173  --   LABPROT 26.9* 27.1* 31.4*  INR 2.64* 2.67* 3.25*  CREATININE 1.19* 1.03 1.22*    Estimated Creatinine Clearance: 34.6 ml/min (by C-G formula based on Cr of 1.22).  Assessment: 87yof continuing on warfarin for Afib. INR (3.25) slightly supratherapeutic. PTA warfarin regimen (2.5mg  daily except 1.25mg  TTSun). - CBC stable yesterday - No overt bleeding noted   Goal of Therapy:  INR 2-3   Plan:  -Warfarin 0.5 mg po x 1 dose today -discharge orders written for prior home dose of 1.25 TTSun and 2.5 MWFSat Herby Abraham, Pharm.D. [phone] 01/27/2013 2:22 PM

## 2013-01-27 NOTE — Progress Notes (Addendum)
Bp SBP in 90s this AM, BB giving. Hold Verapmil 240 ex mg per PA. PA changing dose to 120 BID. Will continue to monitor. PT asym, but states she feels nervous about SBP in the 90s

## 2013-01-30 NOTE — Clinical Social Work Placement (Addendum)
    Clinical Social Work Department CLINICAL SOCIAL WORK PLACEMENT NOTE 01/30/2013  Patient:  Joanna Salas, Joanna Salas  Account Number:  [id number] Admit date:  01/19/2013  Clinical Social Worker:  Lupita Leash Jaylynn Mcaleer, LCSWA  Date/time:  01/27/2013 02:00 PM  Clinical Social Work is seeking post-discharge placement for this patient at the following level of care:   SKILLED NURSING   (*CSW will update this form in Epic as items are completed)   01/27/2013  Patient/family provided with [facility] Department of Clinical Social Work's list of facilities offering this level of care within the geographic area requested by the patient (or if unable, by the patient's family).  01/27/2013  Patient/family informed of their freedom to choose among providers that offer the needed level of care, that participate in Medicare, Medicaid or managed care program needed by the patient, have an available bed and are willing to accept the patient.  01/27/2013  Patient/family informed of [facility]' ownership interest in [facility], as well as of the fact that they are under no obligation to receive care at this facility.  PASARR submitted to EDS on  PASARR number received from EDS on   FL2 transmitted to all facilities in geographic area requested by pt/family on  01/24/2013 FL2 transmitted to all facilities within larger geographic area on   Patient informed that his/her managed care company has contracts with or will negotiate with  certain facilities, including the following:   NA   Has existing PASARR     Patient/family informed of bed offers received:  01/24/2013 Patient chooses bed at [facility] Physician recommends and patient chooses bed at    Patient to be transferred to [facility] on  01/27/2013 Patient to be transferred to facility by [facility] Sharin Mons)  The following physician request were entered in Epic:   Additional  Comments: 01/27/13  DC to SNF- daughter did not want return to [facility] and requested [facility]. Notified patient's nurse and SNF of d/c.  No further CSW needs identified.  CSW signing off.  Lorri Frederick. Jaci Lazier, LCSWA

## 2013-02-02 ENCOUNTER — Other Ambulatory Visit: Payer: Self-pay | Admitting: Cardiovascular Disease

## 2013-02-03 ENCOUNTER — Ambulatory Visit (INDEPENDENT_AMBULATORY_CARE_PROVIDER_SITE_OTHER): Payer: Medicare Other | Admitting: Internal Medicine

## 2013-02-03 ENCOUNTER — Encounter: Payer: Self-pay | Admitting: Internal Medicine

## 2013-02-03 VITALS — BP 120/76 | HR 91

## 2013-02-03 DIAGNOSIS — J4520 Mild intermittent asthma, uncomplicated: Secondary | ICD-10-CM

## 2013-02-03 DIAGNOSIS — I509 Heart failure, unspecified: Secondary | ICD-10-CM

## 2013-02-03 DIAGNOSIS — J45909 Unspecified asthma, uncomplicated: Secondary | ICD-10-CM

## 2013-02-03 DIAGNOSIS — I5031 Acute diastolic (congestive) heart failure: Secondary | ICD-10-CM

## 2013-02-03 NOTE — Progress Notes (Signed)
06/23/11- 86 yoF never smoker followed for asthma, rhinitis, complicated by A. fib, HBP LOV-10/14/2010 She declines flu shot but had pneumonia vaccine. For the past week has intermittent chest congestion and morning cough, some blood in mucus from right nostril. If she hurries she gets short of breath and in the winter she will wheeze. She does not think she has a cold. Denies chest pain, swelling, fever. Husband turns the heat up in the home much too high for her comfort. She is confused about the roles of Qvar and her rescue inhaler.  08/28/11- 86 yoF never smoker followed for asthma, rhinitis, complicated by A. fib, HBP   Had declined flu vax  acute illness-1 week , began with left maxillary and left ear pressure pain, sore throat. She had taken a Medrol taper which opened her and she began coughing out green phlegm. We gave Z-Pak January 8. She blamed Medrol for nausea and vomiting . Now nasal congestion, post nasal drip. Chest is no longer tight. Sputum is clear, no fever or pain.  05/01/12- 86 yoF never smoker followed for asthma, rhinitis, complicated by A. fib, HBP Dry cough and wheezing; has been doing good until having to do yard work; QVAR causing sore throat-doesnt use Aerochamber with it. Blames pollen while working outdoors over the last 2 weeks for increased nasal congestion with recent loud snore. In the morning has raspy cough and wheeze. Using her rescue inhaler once each morning. Does not think she has a cold. Has never had a flu shot. I advised we start but she deferred.  09/12/2012 Acute OV  Complains of wheezing, DOE x1 week .  Complains over the last 3 weeks that she has some nasal congestion, drainage, and cough. Initially called in a Z-Pak which helped with her cough/congestion /sinus symptoms, however, she continues to have a mild dry cough and intermittent wheezing.  Patient denies any discolored mucus, fever, or hemoptysis, orthopnea, PND, or unintentional weight loss. She  has had increased rescue inhaler use.  11/20/12- 87 yoF never smoker followed for asthma, rhinitis, complicated by A. fib, HBP FOLLOWS FOR: having chest congestion/wheezing-feels stuck in throat area-would like to know if she needs neb tx. Watery rhinorrhea, wheezing and shortness of breath the past 2 or 3 days. Doesn't think it's a cold. Taking Zyrtec. Dymista nasal spray did not help. Rescue inhaler works well. Husband died of heart disease/CHF in March.  12/02/12- 87 yoF never smoker followed for asthma, rhinitis, complicated by A. fib, HBP ACUTE VISIT: seen 11-20-2012; still having wheezing(rattles) in chest, cough-non productive; took Zpak and 1 day left of Prednisone. Feels run down. Since last office visit has taken prednisone and a Z-Pak. Malaise is partly from grieving over death of her husband. She still wheezes some and continues to need Zyrtec for spring pollen rhinitis.  02/03/13- 87 yoF never smoker followed for asthma, rhinitis, complicated by A. fib, HBP FOLLOWS FOR: no SOB at this time; doing well overall. Currently at [facility] rehab center . Had fallen off a stepladder and broken pelvis-no surgery. CT chest while in hospital. Husband had died in 2022-10-26.of renal failure. CT chest 01/19/13 IMPRESSION:  1. Technically adequate study. No pulmonary embolism.  2. Cardiomegaly and small bilateral pleural effusions compatible  with failure.  3. Borderline aneurysmal dilation of the ascending aorta at 40 mm.  No acute aortic abnormality identified allowing for phase of  contrast.  Original Report Authenticated By: Andreas Newport, M.D.  ROS-see HPI Constitutional:   No-  weight loss, night sweats, fevers, chills, fatigue, lassitude. HEENT:   No-  headaches, difficulty swallowing, tooth/dental problems, sore throat,       No-  sneezing, itching, ear ache, +nasal congestion, +post nasal drip,  CV:  No-   chest pain, orthopnea, PND, swelling in lower extremities, anasarca,                                   dizziness, palpitations Resp: +  shortness of breath with exertion or at rest.              No-   productive cough,  No non-productive cough,  No- coughing up of blood.              No-   change in color of mucus.  No- wheezing.   Skin: No-   rash or lesions. GI:  No-   heartburn, indigestion, abdominal pain, nausea, vomiting, GU:  MS:  No-   joint pain or swelling.. Neuro-     nothing unusual Psych:  No- change in mood or affect. No depression or anxiety.  No memory loss.  OBJ- Physical Exam General- Alert, Oriented, Affect-appropriate, Distress- none acute, +wheelchair Skin- rash-none, lesions- none, excoriation- none Lymphadenopathy- none Head- atraumatic            Eyes- Gross vision intact, PERRLA, conjunctivae and secretions clear            Ears- Hearing, canals-normal            Nose- +mucus bridging, no-Septal dev,  polyps, erosion, perforation             Throat- Mallampati II , mucosa clear , drainage- none, tonsils- atrophic.+ Dentures Neck- flexible , trachea midline, no stridor , thyroid nl, carotid no bruit Chest - symmetrical excursion , unlabored           Heart/CV- +IRR-regular on exam today , no murmur , no gallop  , no rub, nl s1 s2                           - JVD- none , edema- none, stasis changes- none, varices- none           Lung- no- wheeze, unlabored, cough- none , dullness-none, rub- none           Chest wall-  Abd-  Br/ Gen/ Rectal- Not done, not indicated Extrem- cyanosis- none, clubbing, none, atrophy- none, strength- nl Neuro- grossly intact to observation

## 2013-02-03 NOTE — Patient Instructions (Addendum)
Please call as needed 

## 2013-02-04 NOTE — Telephone Encounter (Signed)
Rx was sent to pharmacy electronically. 

## 2013-02-10 ENCOUNTER — Ambulatory Visit: Payer: Medicare Other | Admitting: Pharmacist Clinician (PhC)/ Clinical Pharmacy Specialist

## 2013-02-10 ENCOUNTER — Encounter: Payer: Self-pay | Admitting: Cardiology

## 2013-02-10 ENCOUNTER — Ambulatory Visit (INDEPENDENT_AMBULATORY_CARE_PROVIDER_SITE_OTHER): Payer: Medicare Other | Admitting: Cardiology

## 2013-02-10 VITALS — BP 110/78 | HR 114 | Ht 67.0 in | Wt 146.8 lb

## 2013-02-10 DIAGNOSIS — F32A Depression, unspecified: Secondary | ICD-10-CM

## 2013-02-10 DIAGNOSIS — I5031 Acute diastolic (congestive) heart failure: Secondary | ICD-10-CM

## 2013-02-10 DIAGNOSIS — Z7901 Long term (current) use of anticoagulants: Secondary | ICD-10-CM

## 2013-02-10 DIAGNOSIS — N183 Chronic kidney disease, stage 3 unspecified: Secondary | ICD-10-CM

## 2013-02-10 DIAGNOSIS — F3289 Other specified depressive episodes: Secondary | ICD-10-CM

## 2013-02-10 DIAGNOSIS — I509 Heart failure, unspecified: Secondary | ICD-10-CM

## 2013-02-10 DIAGNOSIS — F329 Major depressive disorder, single episode, unspecified: Secondary | ICD-10-CM

## 2013-02-10 DIAGNOSIS — I4821 Permanent atrial fibrillation: Secondary | ICD-10-CM

## 2013-02-10 DIAGNOSIS — I4891 Unspecified atrial fibrillation: Secondary | ICD-10-CM

## 2013-02-10 HISTORY — DX: Depression, unspecified: F32.A

## 2013-02-10 MED ORDER — CITALOPRAM HYDROBROMIDE 10 MG PO TABS: 10.0000 mg | ORAL_TABLET | Freq: Every day | ORAL | Status: DC

## 2013-02-10 NOTE — Assessment & Plan Note (Signed)
We follow chronically

## 2013-02-10 NOTE — Assessment & Plan Note (Signed)
Discharged 01/27/13

## 2013-02-10 NOTE — Assessment & Plan Note (Signed)
Rate runs 100-115

## 2013-02-10 NOTE — Progress Notes (Signed)
02/10/2013 Joanna Salas   06/17/1925  [id number]  Primary Physicia Default, Provider, MD Primary Cardiologist: Dr Allyson Sabal  HPI:  79 year old female patient who normally sees Dr. Allyson Sabal for cardiology. She has a history of permanent chronic atrial fibrillation on Coumadin. Other history includes hypertension hyperlipidemia her last Myoview was 2007 low-risk study.          The patient was recently hospitalized at [facility] long after a pelvic fracture from fall at home. During the hospitalization she developed acute renal failure her ARB was discontinued. She had been discharged to skilled nursing facility for further rehab. She presented back to the emergency room 01/19/2013 secondary for lower extremity edema dyspnea on exertion those limiting her physical therapy at skilled nursing facility. In the emergency room her Pro BNP was 5000 and she was about 20lbs over her usual weight. She was admitted and diuresed. Her renal function remained stable. Her AF was hard to control despite significant doses of beta blocker and Verapamil. Echo showed preserved LVF with mild to mod MR and mild pulmonary HTN. She was discharged back to Rehab at the nursing home 01/27/13. She is here now with her daughter for follow up.            Since discharge she has done well from a cardiac standpoint. She is still weak but able to participate in PT. She complained about her fluid restriction that i suspect was continued after discharge. Her weight is 146 today. Her daughter also mentioned that she thinks her mother is depressed. Her husband died last year and she has had a fall and now a prolonged hospitalization. The pt frequently breaks out in tears and in fact was tearful during my interview.    Current Outpatient Prescriptions  Medication Sig Dispense Refill  . acetaminophen (TYLENOL) 500 MG tablet Take 1,000 mg by mouth every 6 (six) hours as needed for pain.      Marland Kitchen albuterol (PROAIR HFA) 108 (90 BASE) MCG/ACT inhaler  Inhale 2 puffs into the lungs every 4 (four) hours as needed for wheezing or shortness of breath.  8.5 g  prn  . ALPRAZolam (XANAX) 0.25 MG tablet Take 1 tablet (0.25 mg total) by mouth at bedtime as needed.  90 tablet  0  . alum & mag hydroxide-simeth (MAALOX/MYLANTA) 200-200-20 MG/5ML suspension Take 15 mLs by mouth every 4 (four) hours as needed.  355 mL  0  . cetirizine (ZYRTEC) 10 MG tablet Take 10 mg by mouth at bedtime.      . furosemide (LASIX) 40 MG tablet Take 1 tablet (40 mg total) by mouth daily.  30 tablet  1  . meclizine (ANTIVERT) 25 MG tablet Take 25-50 mg by mouth 3 (three) times daily as needed for dizziness.      . metoprolol (LOPRESSOR) 100 MG tablet Take 1 tablet (100 mg total) by mouth 2 (two) times daily.  60 tablet  0  . pantoprazole (PROTONIX) 40 MG tablet Take 1 tablet (40 mg total) by mouth daily.      . polyethylene glycol (MIRALAX / GLYCOLAX) packet Take 17 g by mouth daily.  14 each  0  . potassium chloride SA (K-DUR,KLOR-CON) 20 MEQ tablet Take 1 tablet (20 mEq total) by mouth 2 (two) times daily.      . promethazine (PHENERGAN) 12.5 MG tablet Take 12.5-25 mg by mouth at bedtime as needed for nausea (dizziness).      . raloxifene (EVISTA) 60 MG tablet Take 60 mg by mouth  daily.        . senna-docusate (SENOKOT-S) 8.6-50 MG per tablet Take 1 tablet by mouth at bedtime as needed.      . traMADol (ULTRAM) 50 MG tablet Take 1 tablet (50 mg total) by mouth every 6 (six) hours as needed for pain.  30 tablet  0  . verapamil (VERELAN PM) 240 MG 24 hr capsule Take 240 mg by mouth daily.        Marland Kitchen warfarin (COUMADIN) 2.5 MG tablet Take 1.25-2.5 mg by mouth daily. 0.5 tab on Sun, Tues, and Thurs; 1 tab all other days.      . Warfarin - Pharmacist Dosing Inpatient MISC Continue outpatient coumadin monitoring per pharmacist      . citalopram (CELEXA) 10 MG tablet Take 1 tablet (10 mg total) by mouth daily.  30 tablet  5  . DIOVAN 320 MG tablet TAKE 1 TABLET BY MOUTH EVERY DAY  90  tablet  3   No current facility-administered medications for this visit.    Allergies  Allergen Reactions  . Pneumococcal Vaccines   . Atorvastatin Other (See Comments)    Severe Leg Cramps  . Ciprofloxacin Hcl Nausea And Vomiting  . Morphine Sulfate Nausea Only  . Penicillins     REACTION: unspecified  . Prednisone     unknown  . Sotalol Hcl   . Sulfonamide Derivatives Nausea Only    History   Social History  . Marital Status: Married    Spouse Name: N/A    Number of Children: 2  . Years of Education: N/A   Occupational History  . retired     Education officer, museum  .      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 Smoking status: Never Smoker   . Smokeless tobacco: Never Used  . Alcohol Use: No  . Drug Use: No  . Sexually Active: Not on file   Other Topics Concern  . Not on file   Social History Narrative   [place] a son (MVA he was 27 y/o), recently widowed     Review of Systems: General: negative for chills, fever, night sweats or weight changes.  Cardiovascular: negative for chest pain, dyspnea on exertion, edema, orthopnea, palpitations, paroxysmal nocturnal dyspnea or shortness of breath Dermatological: negative for rash Respiratory: negative for cough or wheezing Urologic: negative for hematuria Abdominal: negative for nausea, vomiting, diarrhea, bright red blood per rectum, melena, or hematemesis Neurologic: negative for visual changes, syncope, or dizziness All other systems reviewed and are otherwise negative except as noted above.    Blood pressure 110/78, pulse 114, height 5\' 7"  (1.702 m), weight 146 lb 12.8 oz (66.588 kg).  General appearance: alert, cooperative, appears stated age and no distress Neck: no carotid bruit and no JVD Lungs: clear to auscultation bilaterally Heart: irregularly irregular rhythm Extremities: no edema  EKG  EKG: AF with increased VR.  ASSESSMENT AND PLAN:   Acute diastolic CHF (congestive heart failure) Discharged  01/27/13  Atrial fibrillation, permanent Rate runs 100-115  CKD (chronic kidney disease) stage 3, GFR 30-59 ml/min No ACE oe ARB  Long term (current) use of anticoagulants We follow chronically  Depression Tearful in the office. Her daughter notes she is frequenlty tearful after her husbands death    PLAN:  Same cardiac Rx for now, her rate is a little fast but she tolerates this well and is on a significant amount of rate control medications. Will ask for BMP and INR to be  drawn at SNF. She may need her K+ decreased. I started her on Celexa 10mg  daily. I instructed the SNF they could remove her fluid restriction.   Yazaira Speas KPA-C 02/10/2013 1:37 PM

## 2013-02-10 NOTE — Patient Instructions (Addendum)
No Diovan for now. OK to liberalize fluid intake. Start Celexa 10mg  daily. Have BMP and INR done on July 7th at Rehab center (copy of results to [place] please).

## 2013-02-10 NOTE — Assessment & Plan Note (Signed)
Tearful in the office. Her daughter notes she is frequenlty tearful after her husbands death

## 2013-02-10 NOTE — Assessment & Plan Note (Signed)
No ACE oe ARB

## 2013-02-16 NOTE — Assessment & Plan Note (Signed)
Currently well controlled.  

## 2013-02-16 NOTE — Assessment & Plan Note (Signed)
Recent exacerbation, now improved planning cardiology followup

## 2013-03-03 ENCOUNTER — Ambulatory Visit (INDEPENDENT_AMBULATORY_CARE_PROVIDER_SITE_OTHER): Payer: Medicare Other | Admitting: Pharmacist Clinician (PhC)/ Clinical Pharmacy Specialist

## 2013-03-03 VITALS — BP 122/76 | HR 88

## 2013-03-03 DIAGNOSIS — I4821 Permanent atrial fibrillation: Secondary | ICD-10-CM

## 2013-03-03 DIAGNOSIS — Z7901 Long term (current) use of anticoagulants: Secondary | ICD-10-CM

## 2013-03-03 DIAGNOSIS — I4891 Unspecified atrial fibrillation: Secondary | ICD-10-CM

## 2013-03-03 LAB — POCT INR: INR: 2

## 2013-03-17 ENCOUNTER — Telehealth: Payer: Self-pay | Admitting: Cardiovascular Disease

## 2013-03-17 NOTE — Telephone Encounter (Signed)
Patient states that she was placed on Lasix and Potassium on 02/17/13 for R. Foot edema.  She has an appointment on 02/22/13 for follow up.  Does she needs to continue these 2 meds?

## 2013-03-17 NOTE — Telephone Encounter (Signed)
Returned call.  Pt wanted to know if she still needs to take Lasix and Potassium.  Stated she is out of the Lasix and has two Potassium pills left.  Offered to send in Rx for enough to last until appt.  Pt stated she doesn't want to take the Lasix b/c it makes her feel real weak.  Asked pt if she still has swelling and pt c/o "a little" swelling in R ankle.  Pt wants to hold Lasix until she is seen.  Appt offered for Thursday, 8.7.14 at 3:20pm w/ L. Kilroy, PA-C.  Pt/Daughter okay with appt and scheduled it.  Also scheduled appt at 3:10pm for INR check.  Pt advised to call the office if she has ANY swelling.  Pt verbalized understanding and agreed w/ plan.

## 2013-03-20 ENCOUNTER — Ambulatory Visit (INDEPENDENT_AMBULATORY_CARE_PROVIDER_SITE_OTHER): Payer: Medicare Other | Admitting: Cardiology

## 2013-03-20 ENCOUNTER — Encounter: Payer: Self-pay | Admitting: Cardiology

## 2013-03-20 ENCOUNTER — Ambulatory Visit (INDEPENDENT_AMBULATORY_CARE_PROVIDER_SITE_OTHER): Payer: Medicare Other | Admitting: Pharmacist Clinician (PhC)/ Clinical Pharmacy Specialist

## 2013-03-20 VITALS — BP 156/80 | HR 76 | Ht 67.0 in | Wt 159.9 lb

## 2013-03-20 DIAGNOSIS — I4891 Unspecified atrial fibrillation: Secondary | ICD-10-CM

## 2013-03-20 DIAGNOSIS — Z7901 Long term (current) use of anticoagulants: Secondary | ICD-10-CM

## 2013-03-20 DIAGNOSIS — I4821 Permanent atrial fibrillation: Secondary | ICD-10-CM

## 2013-03-20 DIAGNOSIS — N183 Chronic kidney disease, stage 3 unspecified: Secondary | ICD-10-CM

## 2013-03-20 DIAGNOSIS — I5031 Acute diastolic (congestive) heart failure: Secondary | ICD-10-CM

## 2013-03-20 DIAGNOSIS — I509 Heart failure, unspecified: Secondary | ICD-10-CM

## 2013-03-20 LAB — POCT INR: INR: 2.5

## 2013-03-20 MED ORDER — FUROSEMIDE 40 MG PO TABS: 40.0000 mg | ORAL_TABLET | Freq: Every day | ORAL | Status: DC

## 2013-03-20 MED ORDER — POTASSIUM CHLORIDE CRYS ER 20 MEQ PO TBCR: 20.0000 meq | EXTENDED_RELEASE_TABLET | Freq: Every day | ORAL | Status: DC

## 2013-03-20 NOTE — Assessment & Plan Note (Signed)
Rate controlled 

## 2013-03-20 NOTE — Assessment & Plan Note (Signed)
On Coumadin 

## 2013-03-20 NOTE — Patient Instructions (Signed)
Resume Lasix 40 mg daily, and Potasium 20 meq daily (OK to crush). Return next week for follow up.

## 2013-03-20 NOTE — Assessment & Plan Note (Signed)
Volume overloaded, ran out of Lasix 3 days ago

## 2013-03-20 NOTE — Assessment & Plan Note (Signed)
Diovan stopped during her admission this spring

## 2013-03-21 NOTE — Progress Notes (Signed)
03/21/2013 Joanna Salas   Dec 06, 1924  [id number]  Primary Physicia MANNING, Adelene Amas., MD Primary Cardiologist: Dr Allyson Sabal  HPI:  Pleasant 79 y/o followed by Dr Allyson Sabal with a history of permanent AF, HTN, dyslipidemia, and diastolic CHF.  She was hospitalized earlier this summer with acute on chronic diastolic CHF and rapid AF. She was sent to [facility] from [facility] rehab after a fall and subsequent pelvic fracture at home. Echo showed preserved LVF with mild to mod MR and mild pulmonary HTN. I saw her in the office 02/10/13. She was having some issues with depression, apparently her husband had passed no too long ago. I added Celexa. She returns today with increasing SOB and edema. She says she ran out of her Lasix 3 days ago. She denies orthopnea or PND. She is here with her daughter.,    Current Outpatient Prescriptions  Medication Sig Dispense Refill  . acetaminophen (TYLENOL) 500 MG tablet Take 1,000 mg by mouth every 6 (six) hours as needed for pain.      Marland Kitchen albuterol (PROAIR HFA) 108 (90 BASE) MCG/ACT inhaler Inhale 2 puffs into the lungs every 4 (four) hours as needed for wheezing or shortness of breath.  8.5 g  prn  . ALPRAZolam (XANAX) 0.25 MG tablet Take 1 tablet (0.25 mg total) by mouth at bedtime as needed.  90 tablet  0  . alum & mag hydroxide-simeth (MAALOX/MYLANTA) 200-200-20 MG/5ML suspension Take 15 mLs by mouth every 4 (four) hours as needed.  355 mL  0  . cetirizine (ZYRTEC) 10 MG tablet Take 10 mg by mouth at bedtime.      . citalopram (CELEXA) 10 MG tablet Take 1 tablet (10 mg total) by mouth daily.  30 tablet  5  . meclizine (ANTIVERT) 25 MG tablet Take 25-50 mg by mouth 3 (three) times daily as needed for dizziness.      . metoprolol (LOPRESSOR) 100 MG tablet Take 1 tablet (100 mg total) by mouth 2 (two) times daily.  60 tablet  0  . pantoprazole (PROTONIX) 40 MG tablet Take 1 tablet (40 mg total) by mouth daily.      . polyethylene glycol (MIRALAX / GLYCOLAX) packet Take 17 g by mouth  daily.  14 each  0  . potassium chloride SA (K-DUR,KLOR-CON) 20 MEQ tablet Take 1 tablet (20 mEq total) by mouth daily.  30 tablet  11  . promethazine (PHENERGAN) 12.5 MG tablet Take 12.5-25 mg by mouth at bedtime as needed for nausea (dizziness).      . raloxifene (EVISTA) 60 MG tablet Take 60 mg by mouth daily.        Marland Kitchen senna-docusate (SENOKOT-S) 8.6-50 MG per tablet Take 1 tablet by mouth at bedtime as needed.      . traMADol (ULTRAM) 50 MG tablet Take 1 tablet (50 mg total) by mouth every 6 (six) hours as needed for pain.  30 tablet  0  . verapamil (VERELAN PM) 240 MG 24 hr capsule Take 240 mg by mouth daily.        Marland Kitchen warfarin (COUMADIN) 2.5 MG tablet Take 1.25-2.5 mg by mouth daily. 0.5 tab on Sun, Tues, and Thurs; 1 tab all other days.      . Warfarin - Pharmacist Dosing Inpatient MISC Continue outpatient coumadin monitoring per pharmacist      . furosemide (LASIX) 40 MG tablet Take 1 tablet (40 mg total) by mouth daily.  90 tablet  2   No current facility-administered medications for this  visit.    Allergies  Allergen Reactions  . Pneumococcal Vaccines   . Atorvastatin Other (See Comments)    Severe Leg Cramps  . Ciprofloxacin Hcl Nausea And Vomiting  . Morphine Sulfate Nausea Only  . Penicillins     REACTION: unspecified  . Prednisone     unknown  . Sotalol Hcl   . Sulfonamide Derivatives Nausea Only    History   Social History  . Marital Status: Married    Spouse Name: N/A    Number of Children: 2  . Years of Education: N/A   Occupational History  . retired     Education officer, museum  .      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 Smoking status: Never Smoker   . Smokeless tobacco: Never Used  . Alcohol Use: No  . Drug Use: No  . Sexually Active: Not on file   Other Topics Concern  . Not on file   Social History Narrative   [place] a son (MVA he was 23 y/o), recently widowed     Review of Systems: General: negative for chills, fever, night sweats or weight  changes.  Cardiovascular: negative for chest pain,  orthopnea, palpitations, paroxysmal nocturnal dyspnea  Dermatological: negative for rash Respiratory: negative for cough or wheezing Urologic: negative for hematuria Abdominal: negative for nausea, vomiting, diarrhea, bright red blood per rectum, melena, or hematemesis Neurologic: negative for visual changes, syncope, or dizziness All other systems reviewed and are otherwise negative except as noted above. Low risk Myoview 2007, never cathed    Blood pressure 156/80, pulse 76, height 5\' 7"  (1.702 m), weight 159 lb 14.4 oz (72.53 kg).  General appearance: alert, cooperative, no distress and moderately obese Lungs: decreased breath sounds at bases, no rales Heart: irregularly irregular rhythm Extremities: trace - 1+ bilat edema  EKG AF with VR 70  ASSESSMENT AND PLAN:   Acute diastolic CHF (congestive heart failure) Volume overloaded, ran out of Lasix 3 days ago  Atrial fibrillation, permanent Rate controlled  Long term (current) use of anticoagulants On Coumadin  CKD (chronic kidney disease) stage 3, GFR 30-59 ml/min Diovan stopped during her admission this spring   PLAN  Her Wgt was 146 in June, now 159. Some of that is weight gain after a prolonged hospitalization, some from fluid overload. I resumed her Lasix 40 mg and K+ 20 meq daily. She will return next week. She'll need a BMP then. We may need to decrease her Lopressor, she complained of some wheezing which responded to her prn inhaler and her HR is much improved.   Maci Eickholt KPA-C 03/21/2013 1:44 PM

## 2013-03-23 ENCOUNTER — Inpatient Hospital Stay (HOSPITAL_COMMUNITY)
Admission: EM | Admit: 2013-03-23 | Discharge: 2013-03-28 | DRG: 291 | Disposition: A | Discharge status: Skilled Nursing Facility | Payer: Medicare Other | Attending: Internal Medicine | Admitting: Internal Medicine

## 2013-03-23 ENCOUNTER — Emergency Department (HOSPITAL_COMMUNITY): Payer: Medicare Other

## 2013-03-23 ENCOUNTER — Inpatient Hospital Stay (HOSPITAL_COMMUNITY): Payer: Medicare Other

## 2013-03-23 ENCOUNTER — Encounter (HOSPITAL_COMMUNITY): Payer: Self-pay

## 2013-03-23 DIAGNOSIS — F3289 Other specified depressive episodes: Secondary | ICD-10-CM | POA: Diagnosis present

## 2013-03-23 DIAGNOSIS — N179 Acute kidney failure, unspecified: Secondary | ICD-10-CM | POA: Diagnosis present

## 2013-03-23 DIAGNOSIS — F329 Major depressive disorder, single episode, unspecified: Secondary | ICD-10-CM | POA: Diagnosis present

## 2013-03-23 DIAGNOSIS — I5033 Acute on chronic diastolic (congestive) heart failure: Secondary | ICD-10-CM

## 2013-03-23 DIAGNOSIS — I4891 Unspecified atrial fibrillation: Secondary | ICD-10-CM

## 2013-03-23 DIAGNOSIS — I38 Endocarditis, valve unspecified: Secondary | ICD-10-CM | POA: Diagnosis present

## 2013-03-23 DIAGNOSIS — N189 Chronic kidney disease, unspecified: Secondary | ICD-10-CM

## 2013-03-23 DIAGNOSIS — E785 Hyperlipidemia, unspecified: Secondary | ICD-10-CM | POA: Diagnosis present

## 2013-03-23 DIAGNOSIS — S32599A Other specified fracture of unspecified pubis, initial encounter for closed fracture: Secondary | ICD-10-CM | POA: Diagnosis present

## 2013-03-23 DIAGNOSIS — F411 Generalized anxiety disorder: Secondary | ICD-10-CM | POA: Diagnosis present

## 2013-03-23 DIAGNOSIS — I10 Essential (primary) hypertension: Secondary | ICD-10-CM | POA: Diagnosis present

## 2013-03-23 DIAGNOSIS — I2789 Other specified pulmonary heart diseases: Secondary | ICD-10-CM | POA: Diagnosis present

## 2013-03-23 DIAGNOSIS — I132 Hypertensive heart and chronic kidney disease with heart failure and with stage 5 chronic kidney disease, or end stage renal disease: Secondary | ICD-10-CM | POA: Diagnosis present

## 2013-03-23 DIAGNOSIS — F32A Depression, unspecified: Secondary | ICD-10-CM | POA: Diagnosis present

## 2013-03-23 DIAGNOSIS — I509 Heart failure, unspecified: Secondary | ICD-10-CM

## 2013-03-23 DIAGNOSIS — I359 Nonrheumatic aortic valve disorder, unspecified: Secondary | ICD-10-CM | POA: Diagnosis present

## 2013-03-23 DIAGNOSIS — I059 Rheumatic mitral valve disease, unspecified: Secondary | ICD-10-CM | POA: Diagnosis present

## 2013-03-23 DIAGNOSIS — E119 Type 2 diabetes mellitus without complications: Secondary | ICD-10-CM | POA: Diagnosis present

## 2013-03-23 DIAGNOSIS — R339 Retention of urine, unspecified: Secondary | ICD-10-CM | POA: Diagnosis not present

## 2013-03-23 DIAGNOSIS — I4821 Permanent atrial fibrillation: Secondary | ICD-10-CM | POA: Diagnosis present

## 2013-03-23 DIAGNOSIS — J9601 Acute respiratory failure with hypoxia: Secondary | ICD-10-CM | POA: Diagnosis present

## 2013-03-23 DIAGNOSIS — J9600 Acute respiratory failure, unspecified whether with hypoxia or hypercapnia: Secondary | ICD-10-CM | POA: Diagnosis not present

## 2013-03-23 DIAGNOSIS — Z9181 History of falling: Secondary | ICD-10-CM

## 2013-03-23 DIAGNOSIS — N183 Chronic kidney disease, stage 3 unspecified: Secondary | ICD-10-CM | POA: Diagnosis present

## 2013-03-23 DIAGNOSIS — N139 Obstructive and reflux uropathy, unspecified: Secondary | ICD-10-CM

## 2013-03-23 DIAGNOSIS — I5031 Acute diastolic (congestive) heart failure: Secondary | ICD-10-CM | POA: Diagnosis present

## 2013-03-23 DIAGNOSIS — M810 Age-related osteoporosis without current pathological fracture: Secondary | ICD-10-CM | POA: Diagnosis present

## 2013-03-23 DIAGNOSIS — I130 Hypertensive heart and chronic kidney disease with heart failure and stage 1 through stage 4 chronic kidney disease, or unspecified chronic kidney disease: Principal | ICD-10-CM | POA: Diagnosis present

## 2013-03-23 DIAGNOSIS — N319 Neuromuscular dysfunction of bladder, unspecified: Secondary | ICD-10-CM

## 2013-03-23 DIAGNOSIS — Q619 Cystic kidney disease, unspecified: Secondary | ICD-10-CM

## 2013-03-23 DIAGNOSIS — Z7901 Long term (current) use of anticoagulants: Secondary | ICD-10-CM

## 2013-03-23 DIAGNOSIS — R34 Anuria and oliguria: Secondary | ICD-10-CM | POA: Diagnosis present

## 2013-03-23 DIAGNOSIS — M1990 Unspecified osteoarthritis, unspecified site: Secondary | ICD-10-CM | POA: Diagnosis present

## 2013-03-23 DIAGNOSIS — Z87442 Personal history of urinary calculi: Secondary | ICD-10-CM | POA: Diagnosis present

## 2013-03-23 DIAGNOSIS — R32 Unspecified urinary incontinence: Secondary | ICD-10-CM | POA: Diagnosis not present

## 2013-03-23 LAB — CBC WITH DIFFERENTIAL/PLATELET
Basophils Relative: 0 % (ref 0–1)
Eosinophils Absolute: 0 10*3/uL (ref 0.0–0.7)
HCT: 39.1 % (ref 36.0–46.0)
Hemoglobin: 12 g/dL (ref 12.0–15.0)
MCH: 28.7 pg (ref 26.0–34.0)
MCHC: 30.7 g/dL (ref 30.0–36.0)
Monocytes Absolute: 0.5 10*3/uL (ref 0.1–1.0)
Monocytes Relative: 8 % (ref 3–12)
Neutrophils Relative %: 65 % (ref 43–77)
RDW: 15.4 % (ref 11.5–15.5)

## 2013-03-23 LAB — URINALYSIS, ROUTINE W REFLEX MICROSCOPIC
Ketones, ur: NEGATIVE mg/dL
Nitrite: NEGATIVE
Protein, ur: 30 mg/dL — AB
Urobilinogen, UA: 0.2 mg/dL (ref 0.0–1.0)

## 2013-03-23 LAB — BASIC METABOLIC PANEL
BUN: 22 mg/dL (ref 6–23)
Calcium: 9.1 mg/dL (ref 8.4–10.5)
Creatinine, Ser: 2.07 mg/dL — ABNORMAL HIGH (ref 0.50–1.10)
GFR calc Af Amer: 24 mL/min — ABNORMAL LOW (ref >90)
GFR calc non Af Amer: 21 mL/min — ABNORMAL LOW (ref >90)

## 2013-03-23 LAB — PROTIME-INR: INR: 3.33 — ABNORMAL HIGH (ref 0.00–1.49)

## 2013-03-23 LAB — URINE MICROSCOPIC-ADD ON

## 2013-03-23 MED ORDER — VERAPAMIL HCL ER 240 MG PO CP24: 240.0000 mg | ORAL_CAPSULE | Freq: Every day | ORAL | Status: DC

## 2013-03-23 MED ORDER — POLYETHYLENE GLYCOL 3350 17 G PO PACK
17.0000 g | PACK | Freq: Every day | ORAL | Status: DC
  Administered 2013-03-24 – 2013-03-28 (×4): 17 g via ORAL
  Filled 2013-03-23 (×5): qty 1

## 2013-03-23 MED ORDER — SODIUM CHLORIDE 0.9 % IV SOLN: 250.0000 mL | INTRAVENOUS | Status: DC | PRN

## 2013-03-23 MED ORDER — ALPRAZOLAM 0.25 MG PO TABS
0.2500 mg | ORAL_TABLET | Freq: Every evening | ORAL | Status: DC | PRN
  Administered 2013-03-24 – 2013-03-26 (×3): 0.25 mg via ORAL
  Filled 2013-03-23 (×4): qty 1

## 2013-03-23 MED ORDER — FUROSEMIDE 10 MG/ML IJ SOLN
80.0000 mg | Freq: Once | INTRAMUSCULAR | Status: AC
  Administered 2013-03-23: 80 mg via INTRAVENOUS
  Filled 2013-03-23: qty 8

## 2013-03-23 MED ORDER — ALBUTEROL SULFATE HFA 108 (90 BASE) MCG/ACT IN AERS: 2.0000 | INHALATION_SPRAY | RESPIRATORY_TRACT | Status: DC | PRN

## 2013-03-23 MED ORDER — ACETAMINOPHEN 325 MG PO TABS
650.0000 mg | ORAL_TABLET | ORAL | Status: DC | PRN
  Administered 2013-03-24: 650 mg via ORAL
  Filled 2013-03-23: qty 2

## 2013-03-23 MED ORDER — RALOXIFENE HCL 60 MG PO TABS
60.0000 mg | ORAL_TABLET | Freq: Every day | ORAL | Status: DC
  Administered 2013-03-24 – 2013-03-28 (×5): 60 mg via ORAL
  Filled 2013-03-23 (×5): qty 1

## 2013-03-23 MED ORDER — CITALOPRAM HYDROBROMIDE 10 MG PO TABS
10.0000 mg | ORAL_TABLET | Freq: Every day | ORAL | Status: DC
  Administered 2013-03-24 – 2013-03-28 (×5): 10 mg via ORAL
  Filled 2013-03-23 (×5): qty 1

## 2013-03-23 MED ORDER — ALUM & MAG HYDROXIDE-SIMETH 200-200-20 MG/5ML PO SUSP: 15.0000 mL | ORAL | Status: DC | PRN

## 2013-03-23 MED ORDER — SODIUM CHLORIDE 0.9 % IJ SOLN
3.0000 mL | Freq: Two times a day (BID) | INTRAMUSCULAR | Status: DC
  Administered 2013-03-23 – 2013-03-28 (×10): 3 mL via INTRAVENOUS

## 2013-03-23 MED ORDER — POTASSIUM CHLORIDE CRYS ER 20 MEQ PO TBCR
20.0000 meq | EXTENDED_RELEASE_TABLET | Freq: Every day | ORAL | Status: DC
  Administered 2013-03-24 – 2013-03-27 (×4): 20 meq via ORAL
  Filled 2013-03-23 (×5): qty 1

## 2013-03-23 MED ORDER — ONDANSETRON HCL 4 MG/2ML IJ SOLN
4.0000 mg | Freq: Four times a day (QID) | INTRAMUSCULAR | Status: DC
  Administered 2013-03-23 – 2013-03-24 (×5): 4 mg via INTRAVENOUS
  Filled 2013-03-23 (×6): qty 2

## 2013-03-23 MED ORDER — ONDANSETRON HCL 4 MG/2ML IJ SOLN
4.0000 mg | Freq: Four times a day (QID) | INTRAMUSCULAR | Status: DC | PRN
  Administered 2013-03-24: 4 mg via INTRAVENOUS
  Filled 2013-03-23: qty 2

## 2013-03-23 MED ORDER — FUROSEMIDE 10 MG/ML IJ SOLN
40.0000 mg | Freq: Two times a day (BID) | INTRAMUSCULAR | Status: DC
  Administered 2013-03-23 – 2013-03-27 (×8): 40 mg via INTRAVENOUS
  Filled 2013-03-23 (×10): qty 4

## 2013-03-23 MED ORDER — VERAPAMIL HCL ER 240 MG PO TBCR
240.0000 mg | EXTENDED_RELEASE_TABLET | Freq: Every day | ORAL | Status: DC
  Administered 2013-03-24 – 2013-03-27 (×4): 240 mg via ORAL
  Filled 2013-03-23 (×5): qty 1

## 2013-03-23 MED ORDER — SODIUM CHLORIDE 0.9 % IJ SOLN: 3.0000 mL | INTRAMUSCULAR | Status: DC | PRN

## 2013-03-23 MED ORDER — PANTOPRAZOLE SODIUM 40 MG PO TBEC
40.0000 mg | DELAYED_RELEASE_TABLET | Freq: Every day | ORAL | Status: DC
  Administered 2013-03-24 – 2013-03-28 (×5): 40 mg via ORAL
  Filled 2013-03-23 (×4): qty 1

## 2013-03-23 MED ORDER — METOPROLOL TARTRATE 100 MG PO TABS
100.0000 mg | ORAL_TABLET | Freq: Two times a day (BID) | ORAL | Status: DC
  Administered 2013-03-23 – 2013-03-27 (×8): 100 mg via ORAL
  Filled 2013-03-23 (×12): qty 1

## 2013-03-23 NOTE — ED Notes (Signed)
Lunch tray given. 

## 2013-03-23 NOTE — ED Notes (Signed)
Pt. Has developed urinary retention, just dribbles, sob on exertion (more than the norm) , mild edema to her bilateral feet and ankles.   Pt. Denies any pain or chest pain,

## 2013-03-23 NOTE — ED Provider Notes (Signed)
CSN: [id number]     Arrival date & time 03/23/13  1205 History     First MD Initiated Contact with Patient 03/23/13 1208     Chief Complaint  Patient presents with  . Urinary Retention   (Consider location/radiation/quality/duration/timing/severity/associated sxs/prior Treatment) HPI  79 year old female with history of congestive heart failure, atrial fibrillation currently on warfarin, hypertension and hyperlipidemia here presents complaining of urinary retention. Patient reports since yesterday  she feels the urge to urinate but only make small amount of urine.  Symptoms persist despite taking Lasix for her CHF. Symptom does not feel like a urinary tract infection which she had in the past. Family members noticed that she has also been complaining of shortness of breath with exertion although patient denies. Family member also notice increased edema to her legs and her face and abdomen. Patient otherwise denies fever, chills, productive cough, chest pain, dyspnea on exertion, abdominal pain, back pain, hematuria, numbness, weakness, or dizziness. Patient was actually seen at her doctor's office 2 days ago for increased shortness of breath. At that time she was found to be without her Lasix for 3 days. She was noted to have fluid gain as compared to prior visit. She was prescribed Lasix 40 mg daily and potassium supplementation.  She has been taking the medication prescribed.   Past Medical History  Diagnosis Date  . Personal history of colonic polyps   . Allergic rhinitis, cause unspecified   . Unspecified asthma(493.90)   . Personal history of other diseases of digestive system   . Personal history of urinary calculi   . Osteoporosis, unspecified   . DJD (degenerative joint disease)   . HTN (hypertension)   . Hyperlipidemia   . Anxiety state, unspecified   . Mitral valve regurgitation, mod. on TEE 2004, & mild to mod TR, EF 60%. 01/20/2013  . Pelvic fracture, recent history of 01/20/2013   . Atrial fibrillation, permanent     Hx. of failed DCCV  . Diabetes mellitus without complication    Past Surgical History  Procedure Laterality Date  . Appendectomy    . Tonsillectomy    . Hemicolectomy      right   Family History  Problem Relation Age of Onset  . Heart disease Mother   . Heart disease Father    History  Substance Use Topics  . Smoking status: Never Smoker   . Smokeless tobacco: Never Used  . Alcohol Use: No   OB History   Grav Para Term Preterm Abortions TAB SAB Ect Mult Living                 Review of Systems  Constitutional: Negative for fever.  Respiratory: Negative for shortness of breath.   Cardiovascular: Positive for leg swelling. Negative for chest pain.  Gastrointestinal: Negative for abdominal pain.  Skin: Negative for rash.  All other systems reviewed and are negative.    Allergies  Pneumococcal vaccines; Atorvastatin; Ciprofloxacin hcl; Morphine sulfate; Penicillins; Prednisone; Sotalol hcl; and Sulfonamide derivatives  Home Medications   Current Outpatient Rx  Name  Route  Sig  Dispense  Refill  . DIOVAN 320 MG tablet   Oral   Take 320 mg by mouth daily.         Marland Kitchen acetaminophen (TYLENOL) 500 MG tablet   Oral   Take 1,000 mg by mouth every 6 (six) hours as needed for pain.         . cetirizine (ZYRTEC) 10 MG tablet   Oral  Take 10 mg by mouth at bedtime.         . citalopram (CELEXA) 10 MG tablet   Oral   Take 1 tablet (10 mg total) by mouth daily.   30 tablet   5   . furosemide (LASIX) 40 MG tablet   Oral   Take 1 tablet (40 mg total) by mouth daily.   90 tablet   2   . meclizine (ANTIVERT) 25 MG tablet   Oral   Take 25-50 mg by mouth 3 (three) times daily as needed for dizziness.         . metoprolol (LOPRESSOR) 100 MG tablet   Oral   Take 1 tablet (100 mg total) by mouth 2 (two) times daily.   60 tablet   0   . pantoprazole (PROTONIX) 40 MG tablet   Oral   Take 1 tablet (40 mg total) by mouth  daily.         . polyethylene glycol (MIRALAX / GLYCOLAX) packet   Oral   Take 17 g by mouth daily.   14 each   0   . potassium chloride SA (K-DUR,KLOR-CON) 20 MEQ tablet   Oral   Take 1 tablet (20 mEq total) by mouth daily.   30 tablet   11   . promethazine (PHENERGAN) 12.5 MG tablet   Oral   Take 12.5-25 mg by mouth at bedtime as needed for nausea (dizziness).         . raloxifene (EVISTA) 60 MG tablet   Oral   Take 60 mg by mouth daily.           Marland Kitchen senna-docusate (SENOKOT-S) 8.6-50 MG per tablet   Oral   Take 1 tablet by mouth at bedtime as needed.         . traMADol (ULTRAM) 50 MG tablet   Oral   Take 1 tablet (50 mg total) by mouth every 6 (six) hours as needed for pain.   30 tablet   0   . verapamil (VERELAN PM) 240 MG 24 hr capsule   Oral   Take 240 mg by mouth daily.           Marland Kitchen warfarin (COUMADIN) 2.5 MG tablet   Oral   Take 1.25-2.5 mg by mouth daily. 0.5 tab on Sun, Tues, and Thurs; 1 tab all other days.         . Warfarin - Pharmacist Dosing Inpatient MISC      Continue outpatient coumadin monitoring per pharmacist          There were no vitals taken for this visit. Physical Exam  Nursing note and vitals reviewed. Constitutional: She is oriented to person, place, and time. She appears well-developed and well-nourished. No distress.  HENT:  Head: Atraumatic.  Eyes: Conjunctivae are normal.  Neck: Neck supple. JVD (JVD noted) present.  Cardiovascular: Intact distal pulses.   Murmur (Systolic murmur) heard. Pulmonary/Chest: She has rales (Decreased breath sounds, rales heard at the base of lungs).  Abdominal: Soft. There is no tenderness.  Protuberant abdomen, nontender on exam  Musculoskeletal: She exhibits edema (Bilateral 1+ pitting edema to lower extremities).  Neurological: She is alert and oriented to person, place, and time.  Skin: No rash noted.  Psychiatric: She has a normal mood and affect.    ED Course   Procedures  (including critical care time)   Date: 03/23/2013  Rate: 94  Rhythm: atrial fibrillation  QRS Axis: left  Intervals: normal  ST/T  Wave abnormalities: nonspecific ST/T changes  Conduction Disutrbances:none  Narrative Interpretation:   Old EKG Reviewed: unchanged    Patient here with complaint of urinary retention. The symptom is suggestive of a UTI. Will obtain postvoid residual, bladder scan, UA for further evaluation. No history of cancer to suggest obstructive etiology. Patient has history of CHF, her initial oxygen is 67% on room air, improves to 98% on 3 L of oxygen. Unsure if this is an accurate measurement, will check O2 with patient on room air. Patient otherwise in no acute respiratory distress. No complaints of chest pain, or abdominal pain. Care discussed with attending.  2:37 PM Patient has more than 20 pounds weight gain within the past several days. She sats at 88% on room air but improved to 96-98% on 2 L O2. Her proBNP is 5835, elevated from prior values. There is a bump in her creatinine to 2.07 from 1.22 a month ago.  Her chest x-ray shows moderate right and left pleural effusion with mid and lower right lung atelectasis/airspace opacity. There is cardiomegaly with pulmonary vascular congestion. Patient is currently afebrile with no complaints of productive cough to suggest pneumonia. Patient currently on 40 mg Lasix once daily, will give 80 mg Lasix via IV here.  her INR is 3.33.  Troponin neg, no ECG changes.    2:47 PM [facility] has seen and evaluate pt.  The plan is to have patient admitted for acute on chronic diastolic CHF, also with acute kidney injury and with plan for IV diuresis, renal ultrasound.     Labs Reviewed  BASIC METABOLIC PANEL - Abnormal; Notable for the following:    Glucose, Bld 116 (*)    Creatinine, Ser 2.07 (*)    GFR calc non Af Amer 21 (*)    GFR calc Af Amer 24 (*)    All other components within normal limits  PRO B NATRIURETIC PEPTIDE -  Abnormal; Notable for the following:    Pro B Natriuretic peptide (BNP) 5835.0 (*)    All other components within normal limits  PROTIME-INR - Abnormal; Notable for the following:    Prothrombin Time 32.6 (*)    INR 3.33 (*)    All other components within normal limits  CBC WITH DIFFERENTIAL  URINALYSIS, ROUTINE W REFLEX MICROSCOPIC  POCT I-STAT TROPONIN I   Dg Chest 2 View  03/23/2013   *RADIOLOGY REPORT*  Clinical Data: 79 year old female with shortness of breath and swelling.  CHEST - 2 VIEW  Comparison: 01/19/2013 and prior radiographs  Findings: Cardiomegaly and pulmonary vascular congestion noted. A moderate right and small left pleural effusion noted. Atelectasis/opacity within the mid and lower right lung noted. There is no evidence of pneumothorax. No acute bony abnormalities are present.  IMPRESSION: Moderate right and small left pleural effusions with mid and lower right lung atelectasis/airspace opacity.  Cardiomegaly with pulmonary vascular congestion.   Original Report Authenticated By: Harmon Pier, M.D.   1. CHF (congestive heart failure), acute on chronic, diastolic   2. AKI (acute kidney injury)   3. A-fib     MDM  BP 123/58  Pulse 88  Temp(Src) 98.5 F (36.9 C) (Oral)  Resp 19  Ht 5' 6.5" (1.689 m)  Wt 158 lb 9 oz (71.923 kg)  BMI 25.21 kg/m2  SpO2 98%  I have reviewed nursing notes and vital signs. I personally reviewed the imaging tests through PACS system  I reviewed available ER/hospitalization records thought the EMR      Fayrene Helper,  PA-C 03/23/13 1453

## 2013-03-23 NOTE — ED Notes (Addendum)
Dr Hilty at the bedside 

## 2013-03-23 NOTE — ED Provider Notes (Addendum)
Medical screening examination/treatment/procedure(s) were conducted as a shared visit with non-physician practitioner(s) and myself.  I personally evaluated the patient during the encounter.  I saw and examined the pt. Abdomen soft. Mild inc wob. Will plan on admission.    Junius Argyle, MD 03/23/13 2056  Junius Argyle, MD 04/01/13 [phone]

## 2013-03-23 NOTE — H&P (Signed)
Joanna Salas is an 79 y.o. female.   Chief Complaint:  SOB  HPI:   Pleasant 79 y/o followed by Dr Allyson Sabal with a history of permanent AF, HTN, dyslipidemia, and diastolic CHF. She was hospitalized earlier this summer with acute on chronic diastolic CHF and rapid AF. She was sent to [facility] from [facility] rehab after a fall and subsequent pelvic fracture at home. Echo showed preserved LVF with mild to mod MR and mild pulmonary HTN. Joanna Salas saw her in the office 02/10/13. She was having some issues with depression, apparently her husband had passed no too long ago. Celexa was added. She was seen in the office last week with increasing SOB and edema. She says she ran out of her Lasix 3 days ago. She denies orthopnea or PND at that time. She is here with her daughter.  Her weight in the office was 159.  The patient presents to day with increasing SOB, urinary retention, N/V LEE and ~10# weight gain.   She's had no response to the Lasix.  She has been drinking water and had a large cup of tea  Yesterday to try and facilitate urination.  She currently denies fever, chest pain, orthopnea, dizziness, PND, cough, congestion, abdominal pain, hematochezia, melena.   Past Medical History  Diagnosis Date  . Personal history of colonic polyps   . Allergic rhinitis, cause unspecified   . Unspecified asthma(493.90)   . Personal history of other diseases of digestive system   . Personal history of urinary calculi   . Osteoporosis, unspecified   . DJD (degenerative joint disease)   . HTN (hypertension)   . Hyperlipidemia   . Anxiety state, unspecified   . Mitral valve regurgitation, mod. on TEE 2004, & mild to mod TR, EF 60%. 01/20/2013  . Pelvic fracture, recent history of 01/20/2013  . Atrial fibrillation, permanent     Hx. of failed DCCV  . Diabetes mellitus without complication     Past Surgical History  Procedure Laterality Date  . Appendectomy    . Tonsillectomy    . Hemicolectomy      right    Family  History  Problem Relation Age of Onset  . Heart disease Mother   . Heart disease Father    Social History:  reports that she has never smoked. She has never used smokeless tobacco. She reports that she does not drink alcohol or use illicit drugs.  Allergies:  Allergies  Allergen Reactions  . Pneumococcal Vaccines   . Atorvastatin Other (See Comments)    Severe Leg Cramps  . Ciprofloxacin Hcl Nausea And Vomiting  . Morphine Sulfate Nausea Only  . Penicillins     REACTION: unspecified  . Prednisone     unknown  . Sotalol Hcl   . Sulfonamide Derivatives Nausea Only     (Not in a hospital admission)  Results for orders placed during the hospital encounter of 03/23/13 (from the past 48 hour(s))  CBC WITH DIFFERENTIAL     Status: None   Collection Time    03/23/13 12:47 PM      Result Value Range   WBC 6.3  4.0 - 10.5 K/uL   RBC 4.18  3.87 - 5.11 MIL/uL   Hemoglobin 12.0  12.0 - 15.0 g/dL   HCT 16.1  09.6 - 04.5 %   MCV 93.5  78.0 - 100.0 fL   MCH 28.7  26.0 - 34.0 pg   MCHC 30.7  30.0 - 36.0 g/dL  RDW 15.4  11.5 - 15.5 %   Platelets 192  150 - 400 K/uL   Neutrophils Relative % 65  43 - 77 %   Neutro Abs 4.1  1.7 - 7.7 K/uL   Lymphocytes Relative 26  12 - 46 %   Lymphs Abs 1.7  0.7 - 4.0 K/uL   Monocytes Relative 8  3 - 12 %   Monocytes Absolute 0.5  0.1 - 1.0 K/uL   Eosinophils Relative 1  0 - 5 %   Eosinophils Absolute 0.0  0.0 - 0.7 K/uL   Basophils Relative 0  0 - 1 %   Basophils Absolute 0.0  0.0 - 0.1 K/uL  BASIC METABOLIC PANEL     Status: Abnormal   Collection Time    03/23/13 12:47 PM      Result Value Range   Sodium 141  135 - 145 mEq/L   Potassium 4.8  3.5 - 5.1 mEq/L   Chloride 101  96 - 112 mEq/L   CO2 30  19 - 32 mEq/L   Glucose, Bld 116 (*) 70 - 99 mg/dL   BUN 22  6 - 23 mg/dL   Creatinine, Ser 1.61 (*) 0.50 - 1.10 mg/dL   Calcium 9.1  8.4 - 09.6 mg/dL   GFR calc non Af Amer 21 (*) >90 mL/min   GFR calc Af Amer 24 (*) >90 mL/min   Comment:             The eGFR has been calculated     using the CKD EPI equation.     This calculation has not been     validated in all clinical     situations.     eGFR's persistently     <90 mL/min signify     possible Chronic Kidney Disease.  PROTIME-INR     Status: Abnormal   Collection Time    03/23/13 12:47 PM      Result Value Range   Prothrombin Time 32.6 (*) 11.6 - 15.2 seconds   INR 3.33 (*) 0.00 - 1.49  PRO B NATRIURETIC PEPTIDE     Status: Abnormal   Collection Time    03/23/13 12:48 PM      Result Value Range   Pro B Natriuretic peptide (BNP) 5835.0 (*) 0 - 450 pg/mL  POCT I-STAT TROPONIN I     Status: None   Collection Time    03/23/13 12:54 PM      Result Value Range   Troponin i, poc 0.04  0.00 - 0.08 ng/mL   Comment 3            Comment: Due to the release kinetics of cTnI,     a negative result within the first hours     of the onset of symptoms does not rule out     myocardial infarction with certainty.     If myocardial infarction is still suspected,     repeat the test at appropriate intervals.   Dg Chest 2 View  03/23/2013   *RADIOLOGY REPORT*  Clinical Data: 79 year old female with shortness of breath and swelling.  CHEST - 2 VIEW  Comparison: 01/19/2013 and prior radiographs  Findings: Cardiomegaly and pulmonary vascular congestion noted. A moderate right and small left pleural effusion noted. Atelectasis/opacity within the mid and lower right lung noted. There is no evidence of pneumothorax. No acute bony abnormalities are present.  IMPRESSION: Moderate right and small left pleural effusions with mid and lower right lung  atelectasis/airspace opacity.  Cardiomegaly with pulmonary vascular congestion.   Original Report Authenticated By: Harmon Pier, M.D.    Review of Systems  Constitutional: Negative for fever and diaphoresis.  HENT: Negative for congestion and sore throat.   Respiratory: Positive for shortness of breath. Negative for cough.   Cardiovascular:  Positive for orthopnea and leg swelling. Negative for chest pain.  Gastrointestinal: Positive for nausea and vomiting. Negative for abdominal pain and blood in stool.  Genitourinary:       Oliguria  Musculoskeletal: Negative for myalgias.  Neurological: Negative for dizziness.  All other systems reviewed and are negative.    Blood pressure 106/59, temperature 98.5 F (36.9 C), temperature source Oral, resp. rate 20, height 5' 6.5" (1.689 m), weight 158 lb 9 oz (71.923 kg), SpO2 88.00%. Physical Exam  Constitutional: She is oriented to person, place, and time. She appears well-developed and well-nourished. No distress.  HENT:  Head: Normocephalic and atraumatic.  Mouth/Throat: Oropharynx is clear and moist. No oropharyngeal exudate.  Eyes: EOM are normal. Pupils are equal, round, and reactive to light. No scleral icterus.  Neck: Normal range of motion. JVD present.  Cardiovascular: Normal rate, S1 normal and S2 normal.  An irregularly irregular rhythm present.  No murmur heard. Pulses:      Radial pulses are 2+ on the right side, and 2+ on the left side.       Dorsalis pedis pulses are 2+ on the right side, and 2+ on the left side.  Respiratory: Effort normal.  GI: Soft. Bowel sounds are normal. She exhibits no distension. There is tenderness (Mild suprapubic tenderness.).  Musculoskeletal: She exhibits edema.  1+ LEE   Lymphadenopathy:    She has no cervical adenopathy.  Neurological: She is alert and oriented to person, place, and time. She exhibits normal muscle tone.  Skin: Skin is warm and dry.  Psychiatric: She has a normal mood and affect.     Assessment/Plan  Principal Problem:   Acute on chronic diastolic CHF (congestive heart failure), NYHA class 2 Active Problems:   HYPERTENSION   Atrial fibrillation, permanent   AKI (acute kidney injury)   Anticoagulated on Coumadin  Plan:  Admit to tele for IV diuresis.   Renal Ultrasound.  UA pending. +JVD.  Surprisingly  little LEE.  BNP is elevated > 5000.  SCr 2.07.  Last was 1.22 in June.  Hold Diovan.  CXR: Moderate right and small left pleural effusions with mid and lower right lung atelectasis/ airspace opacity.  Joanna Salas 03/23/2013, 2:27 PM

## 2013-03-23 NOTE — H&P (Signed)
Pt. Seen and examined. Agree with the NP/PA-C note as written. 79 yo female with multiple medical problems including mostly diastolic HF, mild to moderate AS and depression/grieving secondary to the loss of her husband. She also has permanent a-fib and was recently seen in the office - not felt to be volume overloaded at the time, but has had progressive dyspnea, 10 lb weight gain and little to no urine output. Creatinine is 2.07, up from a baseline of 1.2. She also has bilateral pleural effusions with basilar crackles. There is prominent JVD to the earlobes and 1-2+ bilateral pitting edema. It is difficult to say whether she is in renal failure leading to volume overload or CHF leading to renal failure (cardiorenal syndrome).  I would recommend placing a foley catheter to monitor Uop closely due to incontinence. I agree with Renal ultrasound. Hold ARB. Recommend high dose lasix to try to jumpstart the kidneys. May need a nephrology consult.  Chrystie Nose, MD, [facility] Attending Cardiologist [facility]

## 2013-03-24 DIAGNOSIS — N318 Other neuromuscular dysfunction of bladder: Secondary | ICD-10-CM

## 2013-03-24 LAB — BASIC METABOLIC PANEL
BUN: 25 mg/dL — ABNORMAL HIGH (ref 6–23)
CO2: 32 mEq/L (ref 19–32)
Calcium: 9 mg/dL (ref 8.4–10.5)
Chloride: 102 mEq/L (ref 96–112)
Creatinine, Ser: 1.97 mg/dL — ABNORMAL HIGH (ref 0.50–1.10)
Glucose, Bld: 99 mg/dL (ref 70–99)

## 2013-03-24 MED ORDER — PROMETHAZINE HCL 25 MG/ML IJ SOLN
12.5000 mg | Freq: Four times a day (QID) | INTRAMUSCULAR | Status: DC | PRN
  Administered 2013-03-24 – 2013-03-25 (×3): 12.5 mg via INTRAVENOUS
  Filled 2013-03-24 (×4): qty 1

## 2013-03-24 NOTE — Progress Notes (Signed)
Pt disorientated to place, O to time, date name. Pt woke up saying is was going to meet "bill" and has at [facility]. PA aware

## 2013-03-24 NOTE — Progress Notes (Signed)
Pt resting comfortable on bed, VSS, denies pain or discomfort at this time. Pt c/o of difficulty to void, family member at the bed side very anxious. We'll continue to monitor.

## 2013-03-24 NOTE — Progress Notes (Signed)
Subjective: Nausea.    Objective: Vital signs in last 24 hours: Temp:  [98.1 F (36.7 C)-98.6 F (37 C)] 98.4 F (36.9 C) (08/11 0529) Pulse Rate:  [64-115] 115 (08/11 0529) Resp:  [17-25] 18 (08/11 0529) BP: (106-123)/(58-80) 123/68 mmHg (08/11 0529) SpO2:  [67 %-98 %] 93 % (08/11 0529) FiO2 (%):  [96 %] 96 % (08/10 1434) Weight:  [158 lb 9 oz (71.923 kg)-179 lb 8 oz (81.421 kg)] 159 lb 2.8 oz (72.2 kg) (08/11 0529) Last BM Date: 03/22/13  Intake/Output from previous day: 08/10 0701 - 08/11 0700 In: 640 [P.O.:640] Out: 300 [Urine:300] Intake/Output this shift: Total I/O In: 120 [P.O.:120] Out: -   Medications Current Facility-Administered Medications  Medication Dose Route Frequency Provider Last Rate Last Dose  . 0.9 %  sodium chloride infusion  250 mL Intravenous PRN Wilburt Finlay, PA-C      . acetaminophen (TYLENOL) tablet 650 mg  650 mg Oral Q4H PRN Wilburt Finlay, PA-C   650 mg at 03/24/13 0835  . albuterol (PROVENTIL HFA;VENTOLIN HFA) 108 (90 BASE) MCG/ACT inhaler 2 puff  2 puff Inhalation Q4H PRN Wilburt Finlay, PA-C      . ALPRAZolam Prudy Feeler) tablet 0.25 mg  0.25 mg Oral QHS PRN Wilburt Finlay, PA-C   0.25 mg at 03/24/13 0026  . alum & mag hydroxide-simeth (MAALOX/MYLANTA) 200-200-20 MG/5ML suspension 15 mL  15 mL Oral Q4H PRN Wilburt Finlay, PA-C      . citalopram (CELEXA) tablet 10 mg  10 mg Oral Daily Wilburt Finlay, PA-C      . furosemide (LASIX) injection 40 mg  40 mg Intravenous BID Wilburt Finlay, PA-C   40 mg at 03/24/13 0835  . metoprolol (LOPRESSOR) tablet 100 mg  100 mg Oral BID Wilburt Finlay, PA-C   100 mg at 03/23/13 2131  . ondansetron (ZOFRAN) injection 4 mg  4 mg Intravenous Q6H Wilburt Finlay, PA-C   4 mg at 03/24/13 0546  . ondansetron (ZOFRAN) injection 4 mg  4 mg Intravenous Q6H PRN Wilburt Finlay, PA-C      . pantoprazole (PROTONIX) EC tablet 40 mg  40 mg Oral Daily Wilburt Finlay, PA-C      . polyethylene glycol (MIRALAX / GLYCOLAX) packet 17 g  17 g Oral Daily Wilburt Finlay, PA-C      . potassium chloride SA (K-DUR,KLOR-CON) CR tablet 20 mEq  20 mEq Oral Daily Wilburt Finlay, PA-C      . raloxifene (EVISTA) tablet 60 mg  60 mg Oral Daily Wilburt Finlay, PA-C      . sodium chloride 0.9 % injection 3 mL  3 mL Intravenous Q12H Wilburt Finlay, PA-C   3 mL at 03/23/13 2131  . sodium chloride 0.9 % injection 3 mL  3 mL Intravenous PRN Wilburt Finlay, PA-C      . verapamil (CALAN-SR) CR tablet 240 mg  240 mg Oral Daily Drue Fritschle, [facility]        PE: General appearance: alert, cooperative and no distress Lungs: Bilateral rales.  Decreased BS Heart: regular rate and rhythm, S1, S2 normal, no murmur, click, rub or gallop Abdomen: Abd distended.  Suprapubic tenderness.  Extremities: 2+ right LEE.  Almost none on the left. Pulses: 2+ and symmetric Skin: Warm and dry Neurologic: Grossly normal  Lab Results:   Recent Labs  03/23/13 1247  WBC 6.3  HGB 12.0  HCT 39.1  PLT 192   BMET  Recent Labs  03/23/13 1247 03/24/13 0545  NA 141 142  K 4.8 4.8  CL 101 102  CO2 30 32  GLUCOSE 116* 99  BUN 22 25*  CREATININE 2.07* 1.97*  CALCIUM 9.1 9.0   PT/INR  Recent Labs  03/23/13 1247  LABPROT 32.6*  INR 3.33*    Assessment/Plan  Principal Problem:   Acute on chronic diastolic CHF (congestive heart failure), NYHA class 2 Active Problems:   HYPERTENSION   Atrial fibrillation, permanent   AKI (acute kidney injury)   Anticoagulated on Coumadin   Oliguria  Plan:   Very little UOP after 120mg  of IV lasix.  Bladder scanning now.  UA negative for infection.  SCr stable at 1.97.  GFR way down.  Possibly obstructed.  Medical renal disease on renal US.    Bladder scan revealed !   She got up to void right after the scan and only voided .  Foley cath will be inserted.  She does have a history of stones.  Will obtain KUB.  If calcium oxelate, may some up.  UA did show moderate Hgb which may be due irritated urethra.      LOS: 1 day    Joanna Salas,  Joanna Salas 03/24/2013 9:39 AM

## 2013-03-24 NOTE — Progress Notes (Addendum)
Pharmacy - Coumadin  On Coumadin PTA for Afib. INR on admission = 3.33  No DVT prophylaxis needed  Plan: 1) Recheck INR in AM 2) Coumadin home dose - 1.25 mg TTSun, 2.5 mg other days 3) No Ace inhibitor for CHF due to renal insufficiency  Thank you. [name], PharmD [phone]

## 2013-03-24 NOTE — Progress Notes (Addendum)
Pt. Seen and examined. Agree with the NP/PA-C note as written.  Creatinine has improved with diuresis overnight, but she still is having trouble voiding, despite the foley catheter. She had marked urinary retention, suggesting a possible bladder dysfunction? A foley catheter was ordered yesterday on admission but never placed. Bladder scan today showed 750cc retained urine. She subsequently voided, but only 250 cc. Plan - place foley before further diuresis. Consult [facility] for evaluation, ?need for urodynamic testing. She has a history of pelvic fracture, ?stricture - neurogenic bladder.  Chrystie Nose, MD, [facility] Attending Cardiologist [facility]

## 2013-03-25 ENCOUNTER — Ambulatory Visit: Payer: Medicare Other | Admitting: Pharmacist Clinician (PhC)/ Clinical Pharmacy Specialist

## 2013-03-25 ENCOUNTER — Ambulatory Visit: Payer: Medicare Other | Admitting: Cardiology

## 2013-03-25 DIAGNOSIS — N139 Obstructive and reflux uropathy, unspecified: Secondary | ICD-10-CM

## 2013-03-25 DIAGNOSIS — N189 Chronic kidney disease, unspecified: Secondary | ICD-10-CM

## 2013-03-25 LAB — BASIC METABOLIC PANEL
BUN: 24 mg/dL — ABNORMAL HIGH (ref 6–23)
CO2: 30 mEq/L (ref 19–32)
Chloride: 102 mEq/L (ref 96–112)
Creatinine, Ser: 1.51 mg/dL — ABNORMAL HIGH (ref 0.50–1.10)
Glucose, Bld: 85 mg/dL (ref 70–99)

## 2013-03-25 LAB — PROTIME-INR: Prothrombin Time: 36.5 seconds — ABNORMAL HIGH (ref 11.6–15.2)

## 2013-03-25 LAB — GLUCOSE, CAPILLARY: Glucose-Capillary: 125 mg/dL — ABNORMAL HIGH (ref 70–99)

## 2013-03-25 NOTE — Progress Notes (Signed)
Subjective: Feeling better with foley, nausea has improved, better off zofran, now with phergan prn.   Objective: Vital signs in last 24 hours: Temp:  [97.9 F (36.6 C)-98.7 F (37.1 C)] 98.4 F (36.9 C) (08/12 0550) Pulse Rate:  [51-106] 89 (08/12 0913) Resp:  [18-20] 20 (08/12 0550) BP: (99-122)/(59-79) 118/60 mmHg (08/12 0910) SpO2:  [95 %-99 %] 98 % (08/12 0550) Weight:  [155 lb 3.3 oz (70.4 kg)] 155 lb 3.3 oz (70.4 kg) (08/12 0550) Weight change: -24 lb 4.7 oz (-11.021 kg) Last BM Date: 03/24/13 Intake/Output from previous day: -1625 wt 155.3 down from 158 on admit. 08/11 0701 - 08/12 0700 In: 350 [P.O.:350] Out: 1975 [Urine:1925; Emesis/NG output:50] Intake/Output this shift: Total I/O In: 120 [P.O.:120] Out: -   PE: General:Pleasant affect, NAD Skin:Warm and dry, brisk capillary refill HEENT:normocephalic, sclera clear, mucus membranes moist Neck:supple, tr JVD Heart:irreg irreg without murmur, gallup, rub or click Lungs:clear without rales, rhonchi, or wheezes WUJ:WJXB, non tender, + BS, do not palpate liver spleen or masses Ext:no lower ext edema on Lt tr-1+ on RT. 2+ pedal pulses, 2+ radial pulses Neuro:alert and oriented, MAE, follows commands, + facial symmetry   Lab Results:  Recent Labs  03/23/13 1247  WBC 6.3  HGB 12.0  HCT 39.1  PLT 192   BMET  Recent Labs  03/24/13 0545 03/25/13 0510  NA 142 142  K 4.8 4.5  CL 102 102  CO2 32 30  GLUCOSE 99 85  BUN 25* 24*  CREATININE 1.97* 1.51*  CALCIUM 9.0 8.9   No results found for this basename: TROPONINI, CK, MB,  in the last 72 hours  No results found for this basename: CHOL, HDL, LDLCALC, LDLDIRECT, TRIG, CHOLHDL   No results found for this basename: HGBA1C     Lab Results  Component Value Date   TSH 5.768* 01/19/2013      Studies/Results: Dg Chest 2 View  03/23/2013   *RADIOLOGY REPORT*  Clinical Data: 79 year old female with shortness of breath and swelling.  CHEST - 2 VIEW   Comparison: 01/19/2013 and prior radiographs  Findings: Cardiomegaly and pulmonary vascular congestion noted. A moderate right and small left pleural effusion noted. Atelectasis/opacity within the mid and lower right lung noted. There is no evidence of pneumothorax. No acute bony abnormalities are present.  IMPRESSION: Moderate right and small left pleural effusions with mid and lower right lung atelectasis/airspace opacity.  Cardiomegaly with pulmonary vascular congestion.   Original Report Authenticated By: Harmon Pier, M.D.   US Renal  03/23/2013   *RADIOLOGY REPORT*  Clinical Data: Decreased urine output  RENAL/URINARY TRACT ULTRASOUND COMPLETE  Comparison:  12/13/2011 CT  Findings:  Right Kidney:  10.3 cm in length.  Increased cortical echogenicity. No hydronephrosis.  No definite mass.  Left Kidney:  11.0 cm in length.  Increased cortical echogenicity. Ill-defined cortical medullary junction.  Multiple benign-appearing cysts.  The largest is in the interpolar region measuring 2.7 cm.  Bladder:  Within normal limits.  IMPRESSION: No hydronephrosis.  Increased cortical echogenicity and chronic changes compatible with chronic medical renal parenchymal disease.  Benign cysts in the left kidney.   Original Report Authenticated By: Jolaine Click, M.D.    Medications: I have reviewed the patient's current medications. Scheduled Meds: . citalopram  10 mg Oral Daily  . furosemide  40 mg Intravenous BID  . metoprolol  100 mg Oral BID  . ondansetron (ZOFRAN) IV  4 mg Intravenous Q6H  . pantoprazole  40 mg Oral Daily  . polyethylene glycol  17 g Oral Daily  . potassium chloride SA  20 mEq Oral Daily  . raloxifene  60 mg Oral Daily  . sodium chloride  3 mL Intravenous Q12H  . verapamil  240 mg Oral Daily   Continuous Infusions:  PRN Meds:.sodium chloride, acetaminophen, albuterol, ALPRAZolam, alum & mag hydroxide-simeth, ondansetron (ZOFRAN) IV, promethazine, sodium chloride  Assessment/Plan: Principal  Problem:   Acute on chronic diastolic CHF (congestive heart failure), NYHA class 2 Active Problems:   HYPERTENSION   Atrial fibrillation, permanent   AKI (acute kidney injury)   Anticoagulated on Coumadin   Oliguria  PLAN:  With catheter  Now negative -1285 since admit, this is since foley.  + confusion last pm.  Pt had been receiving Zofran every 4 hours for nausea, one of the side effects is urinary retention.  Last dose was yesterday around noon.  ? Pull foley later today to see if resolved vs. Urology consult. Renal u/s: No hydronephrosis. Increased cortical echogenicity and chronic changes compatible with chronic medical renal parenchymal disease. Benign cysts in the left kidney.  Cr. Improving.  Nausea resolved, now with phenergan. Up in chair. INR elevated followed by pharmacy. Her [place] Ouch is tomorrow.    LOS: 2 days   Time spent with pt. :15 minutes. [facility] R  Nurse Practitioner Certified Pager [phone] 03/25/2013, 10:44 AM   I have seen and examined the patient along with Nada Boozer, NP.  I have reviewed the chart, notes and new data.  I agree with PA's note.  Key new complaints: nausea improved, ate a little; alert and oriented; good UO Key examination changes: no rales or JVD, murmur early peaking, normal bowel sounds Key new findings / data: marked reduction in weight and simultaneous improvement in renal function parameters. Suggest resolving obstructive uropathy, overflow incontinence.   PLAN: Try to DC Foley in AM. Check post void residual. If still with urinary retention would DC with Foley in place and outpt Urology appointment.  Thurmon Fair, MD, [facility] [phone] 03/25/2013, 2:37 PM

## 2013-03-25 NOTE — Progress Notes (Signed)
Pt vomited x1 undigested food.   Decline nausea med phenergan / zofran iv.   Requested she only wants ginger ale and some crackers.  Ginger ale and crackers given to pt.  Will continue to monitor.  Amanda Pea, Charity fundraiser.

## 2013-03-25 NOTE — Progress Notes (Signed)
ANTICOAGULATION CONSULT NOTE - Initial Consult  Pharmacy Consult for Coumadin Indication: atrial fibrillation  Allergies  Allergen Reactions  . Pneumococcal Vaccines   . Atorvastatin Other (See Comments)    Severe Leg Cramps  . Ciprofloxacin Hcl Nausea And Vomiting  . Morphine Sulfate Nausea Only  . Penicillins     REACTION: unspecified  . Prednisone     unknown  . Sotalol Hcl   . Sulfonamide Derivatives Nausea Only    Patient Measurements: Height: 5' 6.5" (168.9 cm) Weight: 155 lb 3.3 oz (70.4 kg) IBW/kg (Calculated) : 60.45   Vital Signs: Temp: 98.4 F (36.9 C) (08/12 0550) Temp src: Oral (08/12 0550) BP: 118/60 mmHg (08/12 0910) Pulse Rate: 89 (08/12 0913)  Labs:  Recent Labs  03/23/13 1247 03/24/13 0545 03/25/13 0510  HGB 12.0  --   --   HCT 39.1  --   --   PLT 192  --   --   LABPROT 32.6*  --  36.5*  INR 3.33*  --  3.87*  CREATININE 2.07* 1.97* 1.51*    Estimated Creatinine Clearance: 26 ml/min (by C-G formula based on Cr of 1.51).   Medical History: Past Medical History  Diagnosis Date  . Personal history of colonic polyps   . Allergic rhinitis, cause unspecified   . Unspecified asthma(493.90)   . Personal history of other diseases of digestive system   . Personal history of urinary calculi   . Osteoporosis, unspecified   . DJD (degenerative joint disease)   . HTN (hypertension)   . Hyperlipidemia   . Anxiety state, unspecified   . Mitral valve regurgitation, mod. on TEE 2004, & mild to mod TR, EF 60%. 01/20/2013  . Pelvic fracture, recent history of 01/20/2013  . Atrial fibrillation, permanent     Hx. of failed DCCV  . Diabetes mellitus without complication    Assessment: On Coumadin PTA for Afib.  Admitted with elevated INR.  INR still elevated today.  Goal of Therapy:  INR 2-3 Monitor platelets by anticoagulation protocol: Yes   Plan:  1) Continue to hold Coumadin 2) Daily INR  Thank you. [name], PharmD  [name] 03/25/2013,11:25 AM

## 2013-03-25 NOTE — Progress Notes (Signed)
Pt has rested comfortably this shift. Pt slept through night with no complaints of nausea. Baron Hamper, RN

## 2013-03-26 ENCOUNTER — Encounter: Payer: Self-pay | Admitting: Cardiovascular Disease

## 2013-03-26 DIAGNOSIS — I132 Hypertensive heart and chronic kidney disease with heart failure and with stage 5 chronic kidney disease, or end stage renal disease: Secondary | ICD-10-CM | POA: Diagnosis present

## 2013-03-26 DIAGNOSIS — I130 Hypertensive heart and chronic kidney disease with heart failure and stage 1 through stage 4 chronic kidney disease, or unspecified chronic kidney disease: Secondary | ICD-10-CM | POA: Diagnosis present

## 2013-03-26 DIAGNOSIS — I5031 Acute diastolic (congestive) heart failure: Secondary | ICD-10-CM

## 2013-03-26 LAB — PROTIME-INR: INR: 3 — ABNORMAL HIGH (ref 0.00–1.49)

## 2013-03-26 LAB — BASIC METABOLIC PANEL
CO2: 37 mEq/L — ABNORMAL HIGH (ref 19–32)
Glucose, Bld: 89 mg/dL (ref 70–99)
Potassium: 3.6 mEq/L (ref 3.5–5.1)
Sodium: 143 mEq/L (ref 135–145)

## 2013-03-26 MED ORDER — WARFARIN - PHARMACIST DOSING INPATIENT: Freq: Every day | Status: DC

## 2013-03-26 MED ORDER — WARFARIN SODIUM 1 MG PO TABS
1.0000 mg | ORAL_TABLET | Freq: Once | ORAL | Status: AC
  Administered 2013-03-26: 1 mg via ORAL
  Filled 2013-03-26: qty 1

## 2013-03-26 NOTE — Evaluation (Signed)
Physical Therapy Evaluation Patient Details Name: DEMECIA NORTHWAY MRN: [id number] DOB: 1924/12/20 Today's Date: 03/26/2013 Time: 1308-6578 PT Time Calculation (min): 35 min  PT Assessment / Plan / Recommendation History of Present Illness  The patient presents today with increasing SOB, urinary retention, N/V and ~10# weight gain.  She's had no response to the Lasix.  She has been drinking water and had a large cup of tea yesterday to try and facilitate urination.    Clinical Impression  Pt functioning near baseline however requires RW now where PTA pt ws amb without device. Pt to benefit from HHPT to progress mobility to safe mod I level so patient can resume outpt PT. Acute PT to follow to address mentioned deficits.    PT Assessment  Patient needs continued PT services    Follow Up Recommendations  Supervision - Intermittent;Home health PT    Does the patient have the potential to tolerate intense rehabilitation      Barriers to Discharge        Equipment Recommendations  None recommended by PT    Recommendations for Other Services     Frequency Min 3X/week    Precautions / Restrictions Precautions Precautions: None;Fall Precaution Comments: If she has to manage O2 tubing Restrictions Weight Bearing Restrictions: No   Pertinent Vitals/Pain Pt denies pain      Mobility  Bed Mobility Bed Mobility: Supine to Sit;Sitting - Scoot to Edge of Bed Supine to Sit: 6: Modified independent (Device/Increase time);HOB elevated;With rails Sitting - Scoot to Edge of Bed: 6: Modified independent (Device/Increase time);With rail Details for Bed Mobility Assistance: increased time Transfers Transfers: Sit to Stand;Stand to Sit Sit to Stand: 5: Supervision;With upper extremity assist;From bed Stand to Sit: 5: Supervision;With upper extremity assist;With armrests;To chair/3-in-1 Details for Transfer Assistance: VCs for safe hand placement Ambulation/Gait Ambulation/Gait Assistance:  5: Supervision Ambulation Distance (Feet): 60 Feet Assistive device: Rolling walker Ambulation/Gait Assistance Details: Pt SpO2 90-92% on 2Lo2 via [place] Gait Pattern: Step-through pattern;Decreased stride length Gait velocity: slow General Gait Details: no episodse of LOB, good walker management Stairs: No    Exercises     PT Diagnosis: Generalized weakness  PT Problem List: Decreased strength;Decreased activity tolerance PT Treatment Interventions: DME instruction;Gait training;Functional mobility training;Therapeutic activities;Therapeutic exercise     PT Goals(Current goals can be found in the care plan section) Acute Rehab PT Goals Patient Stated Goal: Get urinary problem figured out before I go home PT Goal Formulation: With patient Time For Goal Achievement: 04/02/13 Potential to Achieve Goals: Good  Visit Information  Last PT Received On: 03/26/13 Assistance Needed: +1 PT/OT Co-Evaluation/Treatment: Yes History of Present Illness: The patient presents today with increasing SOB, urinary retention, N/V and ~10# weight gain.  She's had no response to the Lasix.  She has been drinking water and had a large cup of tea yesterday to try and facilitate urination.         Prior Functioning  Home Living Family/patient expects to be discharged to:: Private residence Living Arrangements: Children Available Help at Discharge: Family;Available PRN/intermittently Type of Home: House Home Access: Ramped entrance Home Layout: One level Home Equipment: Cane - single point;Shower seat;Grab bars - tub/shower;Bedside commode;Walker - 2 wheels Prior Function Level of Independence: Independent with assistive device(s);Needs assistance Gait / Transfers Assistance Needed: Daughter has her going back to using her RW ADL's / Homemaking Assistance Needed: Does not do IADLs Communication Communication: No difficulties Dominant Hand: Right    Cognition  Cognition Arousal/Alertness:  Awake/alert Behavior  During Therapy: WFL for tasks assessed/performed Overall Cognitive Status: Within Functional Limits for tasks assessed    Extremity/Trunk Assessment Upper Extremity Assessment Upper Extremity Assessment: Overall WFL for tasks assessed Lower Extremity Assessment Lower Extremity Assessment: Overall WFL for tasks assessed   Balance Balance Balance Assessed: Yes Static Sitting Balance Static Sitting - Balance Support: No upper extremity supported Static Sitting - Level of Assistance: 6: Modified independent (Device/Increase time) Static Sitting - Comment/# of Minutes: 4 min Dynamic Standing Balance Dynamic Standing - Balance Support: No upper extremity supported Dynamic Standing - Level of Assistance: 6: Modified independent (Device/Increase time) Dynamic Standing - Balance Activities:  (was brushing dentures) Dynamic Standing - Comments: no episodes of LOB/unsteadiness  End of Session PT - End of Session Equipment Utilized During Treatment: Gait belt;Oxygen Activity Tolerance: Patient limited by fatigue Patient left: in chair;with call bell/phone within reach;with family/visitor present Nurse Communication: Mobility status  GP     Marcene Brawn 03/26/2013, 11:26 AM  Lewis Shock, PT, DPT Pager #: [phone] Office #: [phone]

## 2013-03-26 NOTE — Progress Notes (Addendum)
Pt. Seen and examined. Agree with the NP/PA-C note as written.  Joanna Salas appears to be doing well. We have removed the foley to see if she is able to void, per urology recommendations. If she has a high residual, we will replace it and they requested she be discharged with it. This is extremely upsetting to her daughter, who feels that the patient cannot care for it alone at home and will be admitted back in a few days. She also cannot understand why her urologist will not see her in the hospital. I did communicate to her what the recommendations were from the urologists and this is fairly standard practice for patients with voiding difficulty. We will probably keep her overnight for additional diuresis. Her creatinine is nearing normal. BNP is markedly improved.  Chrystie Nose, MD, [facility] Attending Cardiologist [facility]

## 2013-03-26 NOTE — Progress Notes (Signed)
ANTICOAGULATION CONSULT NOTE  Pharmacy Consult for Coumadin Indication: atrial fibrillation  Allergies  Allergen Reactions  . Pneumococcal Vaccines   . Atorvastatin Other (See Comments)    Severe Leg Cramps  . Ciprofloxacin Hcl Nausea And Vomiting  . Morphine Sulfate Nausea Only  . Penicillins     REACTION: unspecified  . Prednisone     unknown  . Sotalol Hcl   . Sulfonamide Derivatives Nausea Only   Labs:  Recent Labs  03/23/13 1247 03/24/13 0545 03/25/13 0510 03/26/13 0545  HGB 12.0  --   --   --   HCT 39.1  --   --   --   PLT 192  --   --   --   LABPROT 32.6*  --  36.5* 30.1*  INR 3.33*  --  3.87* 3.00*  CREATININE 2.07* 1.97* 1.51* 1.16*    Estimated Creatinine Clearance: 33.2 ml/min (by C-G formula based on Cr of 1.16).  Assessment: On Coumadin PTA for Afib.  Admitted with elevated INR.  INR down to 3  Goal of Therapy:  INR 2-3 Monitor platelets by anticoagulation protocol: Yes   Plan:  1) Coumadin 1 mg po x 1 today 2) Daily INR  Thank you. [name], PharmD  [name] 03/26/2013,8:29 AM

## 2013-03-26 NOTE — Progress Notes (Signed)
Pt in bed o4x, daughter at Bedside. Pt and daughter concerned about d/c and being able to care at home.  Will continue to monitor PT

## 2013-03-26 NOTE — Progress Notes (Signed)
Pt family requesting nephology to see before d/c.

## 2013-03-26 NOTE — Progress Notes (Signed)
Subjective:  Still SOB when up.   Objective:  Vital Signs in the last 24 hours: Temp:  [97.4 F (36.3 C)-98.3 F (36.8 C)] 98.2 F (36.8 C) (08/13 0454) Pulse Rate:  [67-114] 114 (08/13 0454) Resp:  [18-20] 18 (08/13 0454) BP: (93-130)/(63-83) 130/83 mmHg (08/13 0454) SpO2:  [93 %-98 %] 93 % (08/13 0454) Weight:  [152 lb 1.9 oz (69 kg)] 152 lb 1.9 oz (69 kg) (08/13 0454)  Intake/Output from previous day:  Intake/Output Summary (Last 24 hours) at 03/26/13 1054 Last data filed at 03/26/13 0900  Gross per 24 hour  Intake    680 ml  Output   3100 ml  Net  -2420 ml    Physical Exam: General appearance: alert, cooperative, no distress and frail Lungs: decreased breath sounds Lt base Heart: irregularly irregular rhythm Extremities: trace edema   Rate: 100  Rhythm: atrial fibrillation  Lab Results:  Recent Labs  03/23/13 1247  WBC 6.3  HGB 12.0  PLT 192    Recent Labs  03/25/13 0510 03/26/13 0545  NA 142 143  K 4.5 3.6  CL 102 100  CO2 30 37*  GLUCOSE 85 89  BUN 24* 19  CREATININE 1.51* 1.16*   No results found for this basename: TROPONINI, CK, MB,  in the last 72 hours Hepatic Function Panel No results found for this basename: PROT, ALBUMIN, AST, ALT, ALKPHOS, BILITOT, BILIDIR, IBILI,  in the last 72 hours No results found for this basename: CHOL,  in the last 72 hours  Recent Labs  03/26/13 0545  INR 3.00*    Imaging: Imaging results have been reviewed  Cardiac Studies:  Assessment/Plan:   Principal Problem:   Acute diastolic CHF (congestive heart failure) Active Problems:   AKI (acute kidney injury)- apparently secondary to urinary retention   Acute respiratory failure with hypoxia   Oliguria   Atrial fibrillation, permanent   UROLITHIASIS, HX OF   Anticoagulated on Coumadin   CKD (chronic kidney disease) stage 3, GFR 30-59 ml/min   HYPERTENSION   Long term (current) use of anticoagulants   Recent Closed fracture of multiple pubic rami  after fall at home. Sent to [facility] for rehab   Valvular heart disease- mild to moderate AS and MR June 2014   Depression    PLAN: The pt was just seen in the office last week. She had run out of her lasix and I had resumed it. She was to return for follow up.this week.            She has had significant diuresis and improvement in SCr since foley placed. Wilburt Finlay spoke with Dr Sherron Monday her urologist. The plan is to pull the foley and see how she does. If she can't void he suggested it be replaced and she follow up with him as an OP. I spoke with pt and her daughter. They are concerned she will be discharged "too soon" and end up being readmitted. The daughter is extremely anxious about her mother going home with a foley cath and wants "everything taken care of before she is discharged". Continue IV Lasix one more day then change to po.  [name] PA-C Beeper [phone] 03/26/2013, 10:54 AM

## 2013-03-26 NOTE — Progress Notes (Signed)
Foley place d/t retention. Remove foley at 1140 pt only able to void total of 75 cc from 1140 until 540. Blabber scanner showed 390cc. Put Pt on BSC only able to void 50cc.

## 2013-03-26 NOTE — Plan of Care (Signed)
Problem: Phase I Progression Outcomes Goal: EF % per last Echo/documented,Core Reminder form on chart Outcome: Completed/Met Date Met:  03/26/13 EF 55-60% per echo on 01/20/13 Goal: Initial discharge plan identified Outcome: Completed/Met Date Met:  03/26/13 Pt to return home Goal: Voiding-avoid urinary catheter unless indicated Outcome: Not Progressing Pt with foley catheter placed on 8/11 per MD order for urinary retention

## 2013-03-26 NOTE — Progress Notes (Signed)
Foley remove. 10cc return. Pt tolerated well. Peri care done.

## 2013-03-26 NOTE — Clinical Documentation Improvement (Signed)
THIS DOCUMENT IS NOT A PERMANENT PART OF THE MEDICAL RECORD  Please update your documentation with the medical record to reflect your response to this query. If you need help knowing how to do this please call [phone].  03/26/13   Dr. Rennis Golden,  In a better effort to capture your patient's severity of illness, reflect appropriate length of stay and utilization of resources, a review of the patient medical record has revealed the following indicators:    - Admitted for Acute on Chronic Diastolic Heart Failure   - Known history of Stage III CKD and episode of Acute Kidney Injury this admission   - Cardiomegaly by CXR this admission   - Mild LVH per Echo 01/2013   - Known history of HTN   Based on your clinical judgment, please document in the progress notes and discharge summary if a condition below provides greater specificity regarding the relationship between the patient's Heart Failure, HTN, LVH, CM and CKD:   - Hypertensive Heart and Kidney Disease   - Other Condition   - Unable to Clinically Determine   In responding to this query please exercise your independent judgment.    The fact that a query is asked, does not imply that any particular answer is desired or expected.  Reviewed: additional documentation in the medical record  Thank You,  Jerral Ralph  RN BSN CCDS Certified Clinical Documentation Specialist: [phone] Health Information Management [place] ______________________________________________________ She has hypertensive heart and kidney disease. -Dr. Rennis Golden

## 2013-03-26 NOTE — Progress Notes (Signed)
Utilization Review Completed.   Onis Markoff, RN, BSN Nurse Case Manager  [phone]  

## 2013-03-26 NOTE — Evaluation (Signed)
Occupational Therapy Evaluation Patient Details Name: Joanna Salas MRN: [id number] DOB: 1925-04-14 Today's Date: 03/26/2013 Time: 8657-8469 OT Time Calculation (min): 31 min  OT Assessment / Plan / Recommendation History of present illness The patient presents today with increasing SOB, urinary retention, N/V and ~10# weight gain.  She's had no response to the Lasix.  She has been drinking water and had a large cup of tea yesterday to try and facilitate urination.  Daughter is very concerned that patient should not have to go home with foley, that it should be figured out as an inpatient what is wrong, not on an outpatient basis--made RN aware of daughter's concern.   Clinical Impression   Pt admitted with above. Pt currently with functional limitations due to the deficits listed below (see OT Problem List). Pt will benefit from skilled OT to increase their safety and independence with ADL and functional mobility for ADL to facilitate discharge to venue listed below.       OT Assessment  Patient needs continued OT Services    Follow Up Recommendations  Home health OT    Barriers to Discharge Decreased caregiver support    Equipment Recommendations  None recommended by OT       Frequency  Min 2X/week    Precautions / Restrictions Precautions Precautions: None;Fall Precaution Comments: If she has to manage O2 tubing Restrictions Weight Bearing Restrictions: No   Pertinent Vitals/Pain Sats 89-90% on RA at rest as well as the same 89-90% on 2liters with ambulation    ADL  Eating/Feeding: Independent;Simulated Where Assessed - Eating/Feeding: Chair Grooming: Performed;Set up;Supervision/safety Where Assessed - Grooming: Unsupported standing Upper Body Bathing: Simulated;Set up;Supervision/safety Where Assessed - Upper Body Bathing: Unsupported sitting Lower Body Bathing: Simulated;Supervision/safety;Set up Where Assessed - Lower Body Bathing: Unsupported sit to  stand Upper Body Dressing: Simulated;Set up;Supervision/safety Where Assessed - Upper Body Dressing: Unsupported sitting Lower Body Dressing: Simulated;Set up;Supervision/safety Where Assessed - Lower Body Dressing: Unsupported sit to stand Toilet Transfer: Therapist, sports:  (Bed>out door down hall>to recliner in room) Toileting - Architect and Hygiene: Simulated;Supervision/safety Where Assessed - Engineer, mining and Hygiene: Standing Equipment Used: Gait belt;Rolling walker Transfers/Ambulation Related to ADLs: S for all with RW ADL Comments: crosses legs to get to feet    OT Diagnosis: Generalized weakness  OT Problem List: Decreased activity tolerance;Decreased knowledge of use of DME or AE OT Treatment Interventions: Self-care/ADL training;Energy conservation;DME and/or AE instruction;Balance training;Patient/family education   OT Goals(Current goals can be found in the care plan section) Acute Rehab OT Goals Patient Stated Goal: Get urinary problem figured out before I go home OT Goal Formulation: With patient Time For Goal Achievement: 04/02/13 Potential to Achieve Goals: Good  Visit Information  Last OT Received On: 03/26/13 Assistance Needed: +1 PT/OT Co-Evaluation/Treatment: Yes History of Present Illness: The patient presents today with increasing SOB, urinary retention, N/V and ~10# weight gain.  She's had no response to the Lasix.  She has been drinking water and had a large cup of tea yesterday to try and facilitate urination.         Prior Functioning     Home Living Family/patient expects to be discharged to:: Private residence Living Arrangements: Children Available Help at Discharge: Family;Available PRN/intermittently Type of Home: House Home Access: Ramped entrance Home Layout: One level Home Equipment: Cane - single point;Shower seat;Grab bars - tub/shower;Bedside commode;Walker - 2  wheels Prior Function Level of Independence: Independent with assistive device(s);Needs assistance Gait / Transfers Assistance  Needed: Daughter has her going back to using her RW ADL's / Homemaking Assistance Needed: Does not do IADLs Communication Communication: No difficulties Dominant Hand: Right         Vision/Perception Vision - History Patient Visual Report: No change from baseline   Cognition  Cognition Behavior During Therapy: WFL for tasks assessed/performed Overall Cognitive Status: Within Functional Limits for tasks assessed    Extremity/Trunk Assessment Upper Extremity Assessment Upper Extremity Assessment: Overall WFL for tasks assessed Lower Extremity Assessment Lower Extremity Assessment: Overall WFL for tasks assessed     Mobility Bed Mobility Bed Mobility: Supine to Sit;Sitting - Scoot to Edge of Bed Supine to Sit: 6: Modified independent (Device/Increase time);HOB elevated;With rails Sitting - Scoot to Edge of Bed: 6: Modified independent (Device/Increase time);With rail Transfers Transfers: Sit to Stand;Stand to Sit Sit to Stand: 5: Supervision;With upper extremity assist;From bed Stand to Sit: 5: Supervision;With upper extremity assist;With armrests;To chair/3-in-1 Details for Transfer Assistance: VCs for safe hand placement           End of Session OT - End of Session Equipment Utilized During Treatment: Gait belt;Rolling walker Activity Tolerance: Patient tolerated treatment well Patient left: in chair       Evette Georges [phone] 03/26/2013, 10:44 AM

## 2013-03-27 ENCOUNTER — Inpatient Hospital Stay (HOSPITAL_COMMUNITY): Payer: Medicare Other

## 2013-03-27 ENCOUNTER — Ambulatory Visit: Payer: Medicare Other | Admitting: Cardiology

## 2013-03-27 LAB — BASIC METABOLIC PANEL
BUN: 16 mg/dL (ref 6–23)
CO2: 41 mEq/L (ref 19–32)
Calcium: 8.9 mg/dL (ref 8.4–10.5)
Chloride: 94 mEq/L — ABNORMAL LOW (ref 96–112)
Creatinine, Ser: 1 mg/dL (ref 0.50–1.10)
GFR calc Af Amer: 57 mL/min — ABNORMAL LOW (ref >90)
GFR calc non Af Amer: 50 mL/min — ABNORMAL LOW (ref >90)
Glucose, Bld: 89 mg/dL (ref 70–99)
Potassium: 3.5 mEq/L (ref 3.5–5.1)
Sodium: 140 mEq/L (ref 135–145)

## 2013-03-27 LAB — PROTIME-INR
INR: 2.28 — ABNORMAL HIGH (ref 0.00–1.49)
Prothrombin Time: 24.4 seconds — ABNORMAL HIGH (ref 11.6–15.2)

## 2013-03-27 MED ORDER — SODIUM CHLORIDE 0.9 % IV BOLUS (SEPSIS)
250.0000 mL | Freq: Once | INTRAVENOUS | Status: AC
  Administered 2013-03-27: 250 mL via INTRAVENOUS

## 2013-03-27 MED ORDER — FUROSEMIDE 10 MG/ML IJ SOLN
40.0000 mg | Freq: Once | INTRAMUSCULAR | Status: AC
  Administered 2013-03-27: 40 mg via INTRAVENOUS

## 2013-03-27 MED ORDER — TAMSULOSIN HCL 0.4 MG PO CAPS
0.4000 mg | ORAL_CAPSULE | Freq: Every day | ORAL | Status: DC
  Administered 2013-03-27 – 2013-03-28 (×2): 0.4 mg via ORAL
  Filled 2013-03-27 (×3): qty 1

## 2013-03-27 MED ORDER — WARFARIN SODIUM 2.5 MG PO TABS
2.5000 mg | ORAL_TABLET | Freq: Once | ORAL | Status: AC
  Administered 2013-03-27: 2.5 mg via ORAL
  Filled 2013-03-27: qty 1

## 2013-03-27 MED ORDER — FUROSEMIDE 40 MG PO TABS
40.0000 mg | ORAL_TABLET | Freq: Every day | ORAL | Status: DC
  Filled 2013-03-27: qty 1

## 2013-03-27 MED ORDER — METOPROLOL TARTRATE 50 MG PO TABS
50.0000 mg | ORAL_TABLET | Freq: Two times a day (BID) | ORAL | Status: DC
  Administered 2013-03-28: 50 mg via ORAL
  Filled 2013-03-27 (×3): qty 1

## 2013-03-27 MED ORDER — SODIUM CHLORIDE 0.9 % IV SOLN: INTRAVENOUS | Status: DC

## 2013-03-27 NOTE — Care Management Note (Signed)
    Page 1 of 1   03/27/2013     3:36:56 PM   CARE MANAGEMENT NOTE 03/27/2013  Patient:  Joanna Salas, Joanna Salas   Account Number:  [id number]  Date Initiated:  03/27/2013  Documentation initiated by:  Tera Mater  Subjective/Objective Assessment:   79yo female admitted with CHF.     Action/Plan:   discharge planning   Anticipated DC Date:  03/28/2013   Anticipated DC Plan:  SKILLED NURSING FACILITY  In-house referral  Clinical Social Worker      DC Planning Services  CM consult      Choice offered to / List presented to:             Status of service:  In process, will continue to follow Medicare Important Message given?   (If response is "NO", the following Medicare IM given date fields will be blank) Date Medicare IM given:   Date Additional Medicare IM given:    Discharge Disposition:    Per UR Regulation:  Reviewed for med. necessity/level of care/duration of stay  If discussed at Long Length of Stay Meetings, dates discussed:    Comments:  03/27/13 1535 Noted CM referral for heart failure home health screen.  PT has recommended SNF.  Lupita Leash, CSW, is aware and pt. may dc to [facility] tomorrow. Tera Mater, RN, BSN NCM [phone]

## 2013-03-27 NOTE — Progress Notes (Signed)
Physical Therapy Treatment Patient Details Name: Joanna Salas MRN: [id number] DOB: Jun 10, 1925 Today's Date: 03/27/2013 Time: 1324-4010 PT Time Calculation (min): 27 min  PT Assessment / Plan / Recommendation  History of Present Illness The patient presents today with increasing SOB, urinary retention, N/V and ~10# weight gain.  She's had no response to the Lasix.  She has been drinking water and had a large cup of tea yesterday to try and facilitate urination.     PT Comments   Pt pleasant & willing to participate in therapy.  Mobility limited by fatigue & SOB with activity.  Trialed ambulation without RW & without supplemental 02 due to 02 sats 97% 2L at rest & pt reports she does not use 02 at home.  02 Sats dropped to low 80's without supplemental 02.  Pt requires closer guarding without RW & not quite as steady as with use of RW.   Pt & pt's daughter feel pt would best benefit from ST-SNF at d/c to maximize functional recovery & return back to independence as she was PTA.  D/c plans updated.     Follow Up Recommendations  SNF     Does the patient have the potential to tolerate intense rehabilitation     Barriers to Discharge        Equipment Recommendations  None recommended by PT    Recommendations for Other Services    Frequency Min 3X/week   Progress towards PT Goals Progress towards PT goals: Progressing toward goals  Plan Discharge plan needs to be updated    Precautions / Restrictions Precautions Precautions: None;Fall Precaution Comments: If she has to manage O2 tubing Restrictions Weight Bearing Restrictions: No   Pertinent Vitals/Pain 02 sats 97% 2L at rest upon arrival.  Sp02 low 80's RA with ambulation.  2nd trial of ambulation with supplemental 02 & Sp02 94% 2L.      Mobility  Bed Mobility Bed Mobility: Not assessed Transfers Transfers: Sit to Stand;Stand to Sit Sit to Stand: 5: Supervision;With upper extremity assist;With armrests;From  chair/3-in-1 Stand to Sit: 5: Supervision;With upper extremity assist;With armrests;To chair/3-in-1 Details for Transfer Assistance: Performed 4x's throughout session.  Cues to reinforce safest hand placement Ambulation/Gait Ambulation/Gait Assistance: 4: Min guard;5: Supervision Ambulation Distance (Feet): 200 Feet (100' x 2) Assistive device: Rolling walker;None Ambulation/Gait Assistance Details:  100' without AD & 100' with RW.  Trialed ambulation with & without RW per pt's request.  Pt with increased steadiness & fluidity of gait with use of RW.  Sp02 dropped to low 80's RA & pt +SOB Gait Pattern: Step-through pattern;Decreased stride length Gait velocity: slow without RW but increased with RW Stairs: No      PT Goals (current goals can now be found in the care plan section) Acute Rehab PT Goals PT Goal Formulation: With patient Time For Goal Achievement: 04/02/13 Potential to Achieve Goals: Good  Visit Information  Last PT Received On: 03/27/13 Assistance Needed: +1 History of Present Illness: The patient presents today with increasing SOB, urinary retention, N/V and ~10# weight gain.  She's had no response to the Lasix.  She has been drinking water and had a large cup of tea yesterday to try and facilitate urination.      Subjective Data      Cognition  Cognition Arousal/Alertness: Awake/alert Behavior During Therapy: WFL for tasks assessed/performed Overall Cognitive Status: Within Functional Limits for tasks assessed    Balance     End of Session PT - End of Session Equipment  Utilized During Treatment: Gait belt;Oxygen Activity Tolerance: Patient tolerated treatment well Patient left: in chair;with call bell/phone within reach;with family/visitor present Nurse Communication: Mobility status     Verdell Face, [place] [phone] 03/27/2013

## 2013-03-27 NOTE — Progress Notes (Signed)
Joanna Salas failed a trial of voiding yesterday with an estimated PVR of 350 after voiding 50cc Spoke with nurses yesterday and today Spoke with daughter last pm Recommend send home with foley and have daughter call us at [phone] to make f/up appointment I can see her as soon as daughter wishes though short term management will not differ  ** i have taken the liberty of starting patient on flomax to help next voiding trial

## 2013-03-27 NOTE — Progress Notes (Signed)
MD aware of pts. BP. RN will hold scheduled BP medication and continue to monitor pt.for changes in condition. Yeraldin Litzenberger, Cheryll Dessert

## 2013-03-27 NOTE — Progress Notes (Signed)
RN paged On call MD for [facility].

## 2013-03-27 NOTE — Progress Notes (Signed)
Agree with d/c updates.  [place], [place] DPT [phone]

## 2013-03-27 NOTE — Progress Notes (Signed)
CRITICAL VALUE ALERT  Critical value received:  CO2 41  Date of notification:  730  Time of notification:  730  Critical value read back:yes  Nurse who received alert:  Tarquin Welcher Tirpp   MD notified (1st page):  [phone], brittany  Time of first page:  731  MD notified (2nd page):  Time of second page:  Responding MD:    Time MD responded:

## 2013-03-27 NOTE — Progress Notes (Signed)
ANTICOAGULATION CONSULT NOTE   Pharmacy Consult for Coumadin Indication: atrial fibrillation  Allergies  Allergen Reactions  . Pneumococcal Vaccines   . Atorvastatin Other (See Comments)    Severe Leg Cramps  . Ciprofloxacin Hcl Nausea And Vomiting  . Morphine Sulfate Nausea Only  . Penicillins     REACTION: unspecified  . Prednisone     unknown  . Sotalol Hcl   . Sulfonamide Derivatives Nausea Only    Labs:  Recent Labs  03/25/13 0510 03/26/13 0545 03/27/13 0522  LABPROT 36.5* 30.1* 24.4*  INR 3.87* 3.00* 2.28*  CREATININE 1.51* 1.16* 1.00    Estimated Creatinine Clearance: 38.6 ml/min (by C-G formula based on Cr of 1).  Assessment: On Coumadin PTA for Afib.  Admitted with elevated INR.  INR now at 2.28  Dose PTA = Coumadin 1.25 mg Sun, Tues, Thurs, 2.5 mg MWFSat  Goal of Therapy:  INR 2-3 Monitor platelets by anticoagulation protocol: Yes   Plan:  1) Coumadin 2.5 mg po x 1 today 2) Daily INR  Thank you. [name], PharmD  [name] 03/27/2013,8:40 AM

## 2013-03-27 NOTE — Progress Notes (Signed)
Pt. Seen and examined. Agree with the NP/PA-C note as written.  She appears to be doing much better. She failed voiding trial. Appreciate urology input. Still hypoxic with ambulation, may need to go on home oxygen. Continue diuresis. Check portable CXR today. Family wishes for her to go to short term rehab, given the problems with her bladder, difficulty managing the foley and continued low O2 sats. This seems reasonable - PT agrees. Will ask Social Work to assist with [facility] placement if possible.  Chrystie Nose, MD, [facility] Attending Cardiologist [facility]

## 2013-03-27 NOTE — Progress Notes (Signed)
Bp bp 84/60 manual. Pt stated feeling tried sitting in chair place pt in bed. No complaints of light headedness. PA aware will continue to monitor

## 2013-03-27 NOTE — Progress Notes (Signed)
PT. With BP of 90/53. On call for [facility] paged. RN will continue to monitor pt. Karishma Unrein, Cheryll Dessert

## 2013-03-27 NOTE — Progress Notes (Signed)
O2 stat when ambulating see PT note

## 2013-03-27 NOTE — Progress Notes (Signed)
Subjective:  She says she is still SOB when up with PT. Repotedly her O2 sats dropped into the 80s.  Objective:  Vital Signs in the last 24 hours: Temp:  [98 F (36.7 C)-98.7 F (37.1 C)] 98.7 F (37.1 C) (08/14 0628) Pulse Rate:  [12-115] 12 (08/14 0628) Resp:  [18-19] 19 (08/14 0628) BP: (118-142)/(59-79) 118/79 mmHg (08/14 0628) SpO2:  [95 %-97 %] 95 % (08/14 0628) Weight:  [148 lb 6.4 oz (67.314 kg)] 148 lb 6.4 oz (67.314 kg) (08/14 0628)  Intake/Output from previous day:  Intake/Output Summary (Last 24 hours) at 03/27/13 0936 Last data filed at 03/27/13 0831  Gross per 24 hour  Intake    840 ml  Output   1900 ml  Net  -1060 ml    Physical Exam: General appearance: alert, cooperative and no distress Lungs: decreased breath sounds Rt base Heart: irregularly irregular rhythm Extremities: no edema   Rate: 90  Rhythm: atrial fibrillation  Lab Results: No results found for this basename: WBC, HGB, PLT,  in the last 72 hours  Recent Labs  03/26/13 0545 03/27/13 0522  NA 143 140  K 3.6 3.5  CL 100 94*  CO2 37* 41*  GLUCOSE 89 89  BUN 19 16  CREATININE 1.16* 1.00   No results found for this basename: TROPONINI, CK, MB,  in the last 72 hours Hepatic Function Panel No results found for this basename: PROT, ALBUMIN, AST, ALT, ALKPHOS, BILITOT, BILIDIR, IBILI,  in the last 72 hours No results found for this basename: CHOL,  in the last 72 hours  Recent Labs  03/27/13 0522  INR 2.28*    Imaging: Imaging results have been reviewed  Cardiac Studies:  Assessment/Plan:   Principal Problem:   Acute diastolic CHF (congestive heart failure) Active Problems:   AKI (acute kidney injury)- apparently secondary to urinary retention   Acute respiratory failure with hypoxia   Oliguria   Atrial fibrillation, permanent   UROLITHIASIS, HX OF   Anticoagulated on Coumadin   CKD (chronic kidney disease) stage 3, GFR 30-59 ml/min   HYPERTENSION   Long term (current)  use of anticoagulants   Recent Closed fracture of multiple pubic rami after fall at home. Sent to 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 Will check RA O2 sat with ambulation. The pt and her daughter feel she may be better off in a nursing home for rehab for a few weeks. I changed Lasix to 40 mg po and decreased to once a day.  [name] PA-C Beeper [phone] 03/27/2013, 9:36 AM

## 2013-03-28 ENCOUNTER — Telehealth: Payer: Self-pay | Admitting: *Deleted

## 2013-03-28 DIAGNOSIS — Z7901 Long term (current) use of anticoagulants: Secondary | ICD-10-CM

## 2013-03-28 DIAGNOSIS — I10 Essential (primary) hypertension: Secondary | ICD-10-CM

## 2013-03-28 DIAGNOSIS — Z5181 Encounter for therapeutic drug level monitoring: Secondary | ICD-10-CM

## 2013-03-28 LAB — BASIC METABOLIC PANEL
BUN: 18 mg/dL (ref 6–23)
CO2: 41 mEq/L (ref 19–32)
Calcium: 9 mg/dL (ref 8.4–10.5)
Chloride: 94 mEq/L — ABNORMAL LOW (ref 96–112)
Creatinine, Ser: 1.01 mg/dL (ref 0.50–1.10)
GFR calc Af Amer: 57 mL/min — ABNORMAL LOW (ref >90)
GFR calc non Af Amer: 49 mL/min — ABNORMAL LOW (ref >90)
Glucose, Bld: 86 mg/dL (ref 70–99)
Potassium: 3.8 mEq/L (ref 3.5–5.1)
Sodium: 140 mEq/L (ref 135–145)

## 2013-03-28 LAB — PROTIME-INR: Prothrombin Time: 23.9 seconds — ABNORMAL HIGH (ref 11.6–15.2)

## 2013-03-28 MED ORDER — TAMSULOSIN HCL 0.4 MG PO CAPS: 0.4000 mg | ORAL_CAPSULE | Freq: Every day | ORAL | Status: DC

## 2013-03-28 MED ORDER — VERAPAMIL HCL ER 120 MG PO TBCR
120.0000 mg | EXTENDED_RELEASE_TABLET | Freq: Every day | ORAL | Status: DC
  Administered 2013-03-28: 120 mg via ORAL
  Filled 2013-03-28: qty 1

## 2013-03-28 MED ORDER — WARFARIN SODIUM 2.5 MG PO TABS
2.5000 mg | ORAL_TABLET | Freq: Once | ORAL | Status: DC
  Filled 2013-03-28: qty 1

## 2013-03-28 MED ORDER — VERAPAMIL HCL ER 120 MG PO TBCR: 120.0000 mg | EXTENDED_RELEASE_TABLET | Freq: Every day | ORAL | Status: DC

## 2013-03-28 NOTE — Telephone Encounter (Signed)
Returned call to pt - dr. Perley Jain does not have pt by this name - by chance is it the urologist? Joanna Haste, RN-BSN

## 2013-03-28 NOTE — Progress Notes (Signed)
CRITICAL VALUE ALERT  Critical value received:  CO2 41    Date of notification:  03/27/13   Time of notification:  0750   Critical value read back: yes   Nurse who received alert:  Cala Bradford    MD notified (1st page):  [name] NP  Time of first page:  [phone]  MD notified (2nd page):  Time of second page:  Responding MD: [name]  Time MD responded:  [phone]

## 2013-03-28 NOTE — Progress Notes (Signed)
ANTICOAGULATION CONSULT NOTE   Pharmacy Consult for Coumadin Indication: atrial fibrillation  Allergies  Allergen Reactions  . Pneumococcal Vaccines   . Atorvastatin Other (See Comments)    Severe Leg Cramps  . Ciprofloxacin Hcl Nausea And Vomiting  . Morphine Sulfate Nausea Only  . Penicillins     REACTION: unspecified  . Prednisone     unknown  . Sotalol Hcl   . Sulfonamide Derivatives Nausea Only    Labs:  Recent Labs  03/26/13 0545 03/27/13 0522 03/28/13 0600  LABPROT 30.1* 24.4* 23.9*  INR 3.00* 2.28* 2.22*  CREATININE 1.16* 1.00 1.01    Estimated Creatinine Clearance: 38.2 ml/min (by C-G formula based on Cr of 1.01).  Assessment: On Coumadin PTA for Afib.  Admitted with elevated INR.  INR now at 2.28  Dose PTA = Coumadin 1.25 mg Sun, Tues, Thurs, 2.5 mg MWFSat  Goal of Therapy:  INR 2-3 Monitor platelets by anticoagulation protocol: Yes   Plan:  1) Coumadin 2.5 mg po x 1 today (resume home dose) 2) Daily INR  Thank you. Joanna Salas, PharmD  Joanna Salas 03/28/2013,8:27 AM

## 2013-03-28 NOTE — Discharge Summary (Signed)
Patient ID: Joanna Salas,  MRN: [id number], DOB/AGE: 77-Jan-1926 77 y.o.  Admit date: 03/23/2013 Discharge date: 03/28/2013  Primary Care Provider:  Primary Cardiologist: Dr Allyson Sabal  Discharge Diagnoses Principal Problem:   Acute diastolic CHF (congestive heart failure) Active Problems:   AKI (acute kidney injury)- apparently secondary to urinary retention   Acute respiratory failure with hypoxia   Oliguria   Atrial fibrillation, permanent   UROLITHIASIS, HX OF   Anticoagulated on Coumadin   CKD (chronic kidney disease) stage 3, GFR 30-59 ml/min   HYPERTENSION   Long term (current) use of anticoagulants   Recent Closed fracture of multiple pubic rami after fall at home. 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 Allyson Sabal for cardiology. She has a history of permanent chronic atrial fibrillation on Coumadin. Other history includes hypertension and hyperlipidemia her. Her last Myoview was 2007 and was a low-risk study. Her last echo was June 2014 and showed normal LVF. The patient was hospitalized in June at [facility] after a pelvic fracture from fall at home. During the hospitalization she developed acute renal failure her ARB was discontinued. She had been discharged to skilled nursing facility for further rehab. She presented back to the emergency room 01/19/2013 secondary to lower extremity edema dyspnea on exertion. In the emergency room her Pro BNP was 5000 and she was about 20lbs over her usual weight. She was admitted and diuresed. Her renal function remained stable. Her AF was hard to control despite significant doses of beta blocker and Verapamil. Echo showed preserved LVF with mild to mod MR and mild pulmonary HTN. She was discharged back to Rehab at the nursing home  01/27/13. She is here now with her daughter for follow up.  Since discharge she has done well from a cardiac standpoint. She is still weak but able to participate in PT. She was seen in the office 03/21/13. She had run out her Lasix three days prior. Her wgt was up. Her weight was up to 159. I resumed her Lasix and was going to see her in follow up but she presented to the ER 03/23/13 with SOB, increased SCr, urinary retention, and CHF. She improved with a foley and diuresis. We contacted Dr Sherron Monday, her urologist. She failed a voiding trial and was discharged with a foley cath in place. Flomax was added and the pt was to follow up as an OP. It was felt the pt was too weak to go home and she will go to SNF for rehab. We did decrease her Calan as her B/P was low at times. Her AF has remained stable and her SCr has normalized. She was hypoxic with ambulation with O2 sats in the 80s, at rest her O2 sats are in the 90s.    Discharge Vitals:  Blood pressure 108/54, pulse 109, temperature 97.6 F (36.4 C), temperature source Oral, resp. rate 21, height 5' 6.5" (1.689 m), weight 147 lb 6.4 oz (66.86 kg), SpO2 96.00%.    Labs: Results for orders placed during the hospital encounter of 03/23/13 (from the past 48 hour(s))  PROTIME-INR     Status: Abnormal   Collection Time    03/27/13  5:22 AM      Result Value Range  Prothrombin Time 24.4 (*) 11.6 - 15.2 seconds   INR 2.28 (*) 0.00 - 1.49  BASIC METABOLIC PANEL     Status: Abnormal   Collection Time    03/27/13  5:22 AM      Result Value Range   Sodium 140  135 - 145 mEq/L   Potassium 3.5  3.5 - 5.1 mEq/L   Chloride 94 (*) 96 - 112 mEq/L   CO2 41 (*) 19 - 32 mEq/L   Comment: CRITICAL RESULT CALLED TO, READ BACK BY AND VERIFIED WITH:     M.LEONARD,RN 0707 03/27/13 CLARK,S   Glucose, Bld 89  70 - 99 mg/dL   BUN 16  6 - 23 mg/dL   Creatinine, Ser 4.54  0.50 - 1.10 mg/dL   Calcium 8.9  8.4 - 09.8 mg/dL   GFR calc non Af Amer 50 (*) >90 mL/min   GFR  calc Af Amer 57 (*) >90 mL/min   Comment: (NOTE)     The eGFR has been calculated using the CKD EPI equation.     This calculation has not been validated in all clinical situations.     eGFR's persistently <90 mL/min signify possible Chronic Kidney     Disease.  PROTIME-INR     Status: Abnormal   Collection Time    03/28/13  6:00 AM      Result Value Range   Prothrombin Time 23.9 (*) 11.6 - 15.2 seconds   INR 2.22 (*) 0.00 - 1.49  BASIC METABOLIC PANEL     Status: Abnormal   Collection Time    03/28/13  6:00 AM      Result Value Range   Sodium 140  135 - 145 mEq/L   Potassium 3.8  3.5 - 5.1 mEq/L   Chloride 94 (*) 96 - 112 mEq/L   CO2 41 (*) 19 - 32 mEq/L   Comment: CRITICAL RESULT CALLED TO, READ BACK BY AND VERIFIED WITH:     K.LUTTERLOH,RN 0747 03/28/13 CLARK,S   Glucose, Bld 86  70 - 99 mg/dL   BUN 18  6 - 23 mg/dL   Creatinine, Ser 1.19  0.50 - 1.10 mg/dL   Calcium 9.0  8.4 - 14.7 mg/dL   GFR calc non Af Amer 49 (*) >90 mL/min   GFR calc Af Amer 57 (*) >90 mL/min   Comment: (NOTE)     The eGFR has been calculated using the CKD EPI equation.     This calculation has not been validated in all clinical situations.     eGFR's persistently <90 mL/min signify possible Chronic Kidney     Disease.    Disposition:  Follow-up Information   Follow up with MCDIARMID,TODD D, MD On 04/03/2013. (10:00am)    Specialty:  Family Medicine   Contact information:   [address] [place] [address] [phone]       Follow up with Runell Gess, MD On 04/17/2013. (2:30pm)    Specialty:  Cardiology   Contact information:   [address] [place] [address] [phone]       Discharge Medications:    Medication List    STOP taking these medications       DIOVAN 320 MG tablet  Generic drug:  valsartan     traMADol 50 MG tablet  Commonly known as:  ULTRAM     verapamil 240 MG 24 hr capsule  Commonly known as:  VERELAN PM  Replaced by:   verapamil 120 MG CR  tablet      TAKE these medications       acetaminophen 500 MG tablet  Commonly known as:  TYLENOL  Take 1,000 mg by mouth every 6 (six) hours as needed for pain.     albuterol 108 (90 BASE) MCG/ACT inhaler  Commonly known as:  PROVENTIL HFA;VENTOLIN HFA  Inhale 2 puffs into the lungs every 4 (four) hours as needed for wheezing or shortness of breath.     ALPRAZolam 0.25 MG tablet  Commonly known as:  XANAX  Take 0.25 mg by mouth at bedtime as needed for sleep.     alum & mag hydroxide-simeth 200-200-20 MG/5ML suspension  Commonly known as:  MAALOX/MYLANTA  Take 15 mLs by mouth every 4 (four) hours as needed for indigestion.     cetirizine 10 MG tablet  Commonly known as:  ZYRTEC  Take 10 mg by mouth at bedtime.     citalopram 10 MG tablet  Commonly known as:  CELEXA  Take 1 tablet (10 mg total) by mouth daily.     furosemide 40 MG tablet  Commonly known as:  LASIX  Take 1 tablet (40 mg total) by mouth daily.     meclizine 25 MG tablet  Commonly known as:  ANTIVERT  Take 25-50 mg by mouth 3 (three) times daily as needed for dizziness.     metoprolol 100 MG tablet  Commonly known as:  LOPRESSOR  Take 1 tablet (100 mg total) by mouth 2 (two) times daily.     pantoprazole 40 MG tablet  Commonly known as:  PROTONIX  Take 1 tablet (40 mg total) by mouth daily.     polyethylene glycol packet  Commonly known as:  MIRALAX / GLYCOLAX  Take 17 g by mouth daily.     potassium chloride SA 20 MEQ tablet  Commonly known as:  K-DUR,KLOR-CON  Take 1 tablet (20 mEq total) by mouth daily.     promethazine 12.5 MG tablet  Commonly known as:  PHENERGAN  Take 12.5-25 mg by mouth at bedtime as needed for nausea (dizziness).     raloxifene 60 MG tablet  Commonly known as:  EVISTA  Take 60 mg by mouth daily.     sennosides-docusate sodium 8.6-50 MG tablet  Commonly known as:  SENOKOT-S  Take 1 tablet by mouth at bedtime as needed for constipation.      tamsulosin 0.4 MG Caps capsule  Commonly known as:  FLOMAX  Take 1 capsule (0.4 mg total) by mouth daily after breakfast.     verapamil 120 MG CR tablet  Commonly known as:  CALAN-SR  Take 1 tablet (120 mg total) by mouth daily.     warfarin 2.5 MG tablet  Commonly known as:  COUMADIN  Take 1.25-2.5 mg by mouth daily. 0.5 tab on Sun, Tues, and Thurs; 1 tab all other days.         Duration of Discharge Encounter: Greater than 30 minutes including physician time.  Jolene Provost PA-C 03/28/2013 12:22 PM

## 2013-03-28 NOTE — Progress Notes (Signed)
SATURATION QUALIFICATIONS: (This note is used to comply with regulatory documentation for home oxygen)  Patient Saturations on Room Air at Rest = 88%  Patient Saturations on 2 Liters of oxygen while Ambulating = 93%   

## 2013-03-28 NOTE — Progress Notes (Signed)
Co-signed for Kimberly Lutterloh RN assessments, IV assessments, I's and O's, care plans, patient education, progress notes, Vital signs, and medications administration. Lenyx Boody M, RN/BSN 

## 2013-03-28 NOTE — Progress Notes (Addendum)
D/c IV, d/c tele, d/c to SNF [facility]. Discharge instructions and medications were given to patient and family members. Patient and family members denied any questions or concerns at this time. Report given to recieving RN at facility. Patient leaving via EMS, with foley catheter, and appears and in acute distress.

## 2013-03-28 NOTE — Progress Notes (Signed)
Subjective:  Up in chair, still SOB with exertion  Objective:  Vital Signs in the last 24 hours: Temp:  [97.6 F (36.4 C)-98.2 F (36.8 C)] 97.6 F (36.4 C) (08/15 0518) Pulse Rate:  [19-88] 19 (08/15 0518) Resp:  [18-22] 21 (08/15 0518) BP: (84-111)/(39-67) 111/67 mmHg (08/15 0518) SpO2:  [96 %-98 %] 96 % (08/15 0518) Weight:  [147 lb 6.4 oz (66.86 kg)] 147 lb 6.4 oz (66.86 kg) (08/15 0518)  Intake/Output from previous day:  Intake/Output Summary (Last 24 hours) at 03/28/13 1043 Last data filed at 03/28/13 1012  Gross per 24 hour  Intake    840 ml  Output   1125 ml  Net   -285 ml    Physical Exam: General appearance: alert, cooperative and no distress Lungs: decreased breath sounds Rt base Heart: irregularly irregular rhythm   Rate: 80  Rhythm: atrial fibrillation  Lab Results: No results found for this basename: WBC, HGB, PLT,  in the last 72 hours  Recent Labs  03/27/13 0522 03/28/13 0600  NA 140 140  K 3.5 3.8  CL 94* 94*  CO2 41* 41*  GLUCOSE 89 86  BUN 16 18  CREATININE 1.00 1.01   No results found for this basename: TROPONINI, CK, MB,  in the last 72 hours Hepatic Function Panel No results found for this basename: PROT, ALBUMIN, AST, ALT, ALKPHOS, BILITOT, BILIDIR, IBILI,  in the last 72 hours No results found for this basename: CHOL,  in the last 72 hours  Recent Labs  03/28/13 0600  INR 2.22*    Imaging: Imaging results have been reviewed- small effusions, Rt basilar atelectasis  Cardiac Studies:  Assessment/Plan:   Principal Problem:   Acute diastolic CHF (congestive heart failure) Active Problems:   AKI (acute kidney injury)- apparently secondary to urinary retention   Acute respiratory failure with hypoxia   Oliguria   Atrial fibrillation, permanent   UROLITHIASIS, HX OF   Anticoagulated on Coumadin   CKD (chronic kidney disease) stage 3, GFR 30-59 ml/min   HYPERTENSION   Long term (current) use of anticoagulants   Recent  Closed fracture of multiple pubic rami after fall at home. 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 Will hold Lasix and decrease Calan (though she has had difficult to control AF in the past). She may need another 24-48hrs before going to SNF.  MD to see.  [name] PA-C Beeper [phone] 03/28/2013, 10:43 AM   I have seen and examined the patient along with [name] PA-C.  I have reviewed the chart, notes and new data.  I agree with PA's note.  Key new complaints: feels much better; some dizziness standing up Key examination changes: no JVD, S3, rales, edema Key new findings / data: creat completely normalized  PLAN: Hypotension likely secondary to mild hypovolemia (concomitant postobstructive diuresis and loop diuretic therapy) and initiation of alpha blocker. Verapamil dose reduced and furosemide temporarily held to compensate. She is ready for [facility] if bed is available. Arrange early office followup (1-2 weeks).  Thurmon Fair, MD, [facility] [phone] 03/28/2013, 11:31 AM

## 2013-04-01 ENCOUNTER — Ambulatory Visit (INDEPENDENT_AMBULATORY_CARE_PROVIDER_SITE_OTHER): Payer: Medicare Other | Admitting: Pharmacist Clinician (PhC)/ Clinical Pharmacy Specialist

## 2013-04-01 VITALS — BP 110/70 | HR 76

## 2013-04-01 DIAGNOSIS — I4891 Unspecified atrial fibrillation: Secondary | ICD-10-CM

## 2013-04-01 DIAGNOSIS — Z7901 Long term (current) use of anticoagulants: Secondary | ICD-10-CM

## 2013-04-01 DIAGNOSIS — I4821 Permanent atrial fibrillation: Secondary | ICD-10-CM

## 2013-04-02 NOTE — Clinical Social Work Psychosocial (Addendum)
    Clinical Social Work Department BRIEF PSYCHOSOCIAL ASSESSMENT 04/02/2013  Patient:  Joanna Salas, Joanna Salas     Account Number:  [id number]     Admit date:  03/23/2013  Clinical Social Worker:  Tiburcio Pea  Date/Time:  03/27/2013 11:00 AM  Referred by:  Physician  Date Referred:  03/27/2013 Referred for  SNF Placement   Other Referral:   Interview type:  Other - See comment Other interview type:   Patient and daughter Fannie Knee    PSYCHOSOCIAL DATA Living Status:  FAMILY Admitted from facility:   Level of care:   Primary support name:  [name]  (c) [phone] Primary support relationship to patient:  CHILD, ADULT Degree of support available:   Strong support    CURRENT CONCERNS Current Concerns  Post-Acute Placement   Other Concerns:    SOCIAL WORK ASSESSMENT / PLAN 79 year old female admitted from home and lives with her daughter.  Original plan was for patient to return home with her daughter but at the last minute decided to seek SNF placement. Her daughter feels that she cannot manage her mother's care at this time as she recently had a fractured femur with rod placement and cannot lift or pull on her mother.  Bed seach process discussed; patient and daughter hope for [facility].  Bed search initiated; Fl2 placed on chart for MDs signature.   Assessment/plan status:  Psychosocial Support/Ongoing Assessment of Needs Other assessment/ plan:   Information/referral to community resources:   SNF bed list provided to patient and daughter    PATIENT'S/FAMILY'S RESPONSE TO PLAN OF CARE: Patient and her daughter are agreeable to SNF search and were appreciative of CSW's assistance.  Patient hopes to be able to get strong enough to return home after rehab.  Her daughter is very invovled and supportive.  Bed search in place.

## 2013-04-02 NOTE — Clinical Social Work Placement (Addendum)
    Clinical Social Work Department CLINICAL SOCIAL WORK PLACEMENT NOTE 04/02/2013  Patient:  Joanna Salas, Joanna Salas  Account Number:  [id number] Admit date:  03/23/2013  Clinical Social Worker:  Lupita Leash Mayelin Panos, LCSWA  Date/time:  03/27/2013 03:30 PM  Clinical Social Work is seeking post-discharge placement for this patient at the following level of care:   SKILLED NURSING   (*CSW will update this form in Epic as items are completed)   03/27/2013  Patient/family provided with [facility] Department of Clinical Social Work's list of facilities offering this level of care within the geographic area requested by the patient (or if unable, by the patient's family).  03/27/2013  Patient/family informed of their freedom to choose among providers that offer the needed level of care, that participate in Medicare, Medicaid or managed care program needed by the patient, have an available bed and are willing to accept the patient.  03/27/2013  Patient/family informed of [facility]' ownership interest in [facility], as well as of the fact that they are under no obligation to receive care at this facility.  PASARR submitted to EDS on 03/27/2013 PASARR number received from EDS on 03/27/2013  FL2 transmitted to all facilities in geographic area requested by pt/family on  03/27/2013 FL2 transmitted to all facilities within larger geographic area on   Patient informed that his/her managed care company has contracts with or will negotiate with  certain facilities, including the following:   NA     Patient/family informed of bed offers received:  03/28/2013 Patient chooses bed at [facility] Physician recommends and patient chooses bed at    Patient to be transferred to [facility] REH[facility] on  03/28/2013 Patient to be transferred to facility by Ambulance  Sharin Mons)  The following physician request were entered in Epic:   Additional Comments: 03/28/13   Ok per MD for d/c today to [facility] for short term SNF. She hopes to return home with her daughter after rehab. Notified SNF and patient's nurse who will call report. No further CSW needs identified. CSW signing off. Lorri Frederick. Otila Starn, LCSWA  [phone]

## 2013-04-07 ENCOUNTER — Emergency Department (HOSPITAL_BASED_OUTPATIENT_CLINIC_OR_DEPARTMENT_OTHER): Payer: Medicare Other

## 2013-04-07 ENCOUNTER — Encounter (HOSPITAL_BASED_OUTPATIENT_CLINIC_OR_DEPARTMENT_OTHER): Payer: Self-pay

## 2013-04-07 ENCOUNTER — Inpatient Hospital Stay (HOSPITAL_BASED_OUTPATIENT_CLINIC_OR_DEPARTMENT_OTHER)
Admission: EM | Admit: 2013-04-07 | Discharge: 2013-04-10 | DRG: 372 | Disposition: A | Discharge status: Home Health | Payer: Medicare Other | Attending: Internal Medicine | Admitting: Internal Medicine

## 2013-04-07 DIAGNOSIS — I4821 Permanent atrial fibrillation: Secondary | ICD-10-CM | POA: Diagnosis present

## 2013-04-07 DIAGNOSIS — F329 Major depressive disorder, single episode, unspecified: Secondary | ICD-10-CM

## 2013-04-07 DIAGNOSIS — J302 Other seasonal allergic rhinitis: Secondary | ICD-10-CM

## 2013-04-07 DIAGNOSIS — Z860100 Personal history of colon polyps, unspecified: Secondary | ICD-10-CM

## 2013-04-07 DIAGNOSIS — Z87442 Personal history of urinary calculi: Secondary | ICD-10-CM

## 2013-04-07 DIAGNOSIS — I10 Essential (primary) hypertension: Secondary | ICD-10-CM

## 2013-04-07 DIAGNOSIS — I38 Endocarditis, valve unspecified: Secondary | ICD-10-CM

## 2013-04-07 DIAGNOSIS — D689 Coagulation defect, unspecified: Secondary | ICD-10-CM

## 2013-04-07 DIAGNOSIS — E872 Acidosis, unspecified: Secondary | ICD-10-CM

## 2013-04-07 DIAGNOSIS — N281 Cyst of kidney, acquired: Secondary | ICD-10-CM

## 2013-04-07 DIAGNOSIS — S32599A Other specified fracture of unspecified pubis, initial encounter for closed fracture: Secondary | ICD-10-CM

## 2013-04-07 DIAGNOSIS — R339 Retention of urine, unspecified: Secondary | ICD-10-CM | POA: Diagnosis present

## 2013-04-07 DIAGNOSIS — E875 Hyperkalemia: Secondary | ICD-10-CM | POA: Diagnosis present

## 2013-04-07 DIAGNOSIS — E860 Dehydration: Secondary | ICD-10-CM | POA: Diagnosis present

## 2013-04-07 DIAGNOSIS — I509 Heart failure, unspecified: Secondary | ICD-10-CM | POA: Diagnosis present

## 2013-04-07 DIAGNOSIS — I132 Hypertensive heart and chronic kidney disease with heart failure and with stage 5 chronic kidney disease, or end stage renal disease: Secondary | ICD-10-CM

## 2013-04-07 DIAGNOSIS — F411 Generalized anxiety disorder: Secondary | ICD-10-CM

## 2013-04-07 DIAGNOSIS — E785 Hyperlipidemia, unspecified: Secondary | ICD-10-CM

## 2013-04-07 DIAGNOSIS — Z8719 Personal history of other diseases of the digestive system: Secondary | ICD-10-CM

## 2013-04-07 DIAGNOSIS — R531 Weakness: Secondary | ICD-10-CM | POA: Diagnosis present

## 2013-04-07 DIAGNOSIS — M1990 Unspecified osteoarthritis, unspecified site: Secondary | ICD-10-CM

## 2013-04-07 DIAGNOSIS — A0472 Enterocolitis due to Clostridium difficile, not specified as recurrent: Principal | ICD-10-CM | POA: Diagnosis present

## 2013-04-07 DIAGNOSIS — R600 Localized edema: Secondary | ICD-10-CM

## 2013-04-07 DIAGNOSIS — R110 Nausea: Secondary | ICD-10-CM | POA: Diagnosis present

## 2013-04-07 DIAGNOSIS — Z79899 Other long term (current) drug therapy: Secondary | ICD-10-CM

## 2013-04-07 DIAGNOSIS — N390 Urinary tract infection, site not specified: Secondary | ICD-10-CM

## 2013-04-07 DIAGNOSIS — I5032 Chronic diastolic (congestive) heart failure: Secondary | ICD-10-CM | POA: Diagnosis present

## 2013-04-07 DIAGNOSIS — I5031 Acute diastolic (congestive) heart failure: Secondary | ICD-10-CM

## 2013-04-07 DIAGNOSIS — M810 Age-related osteoporosis without current pathological fracture: Secondary | ICD-10-CM

## 2013-04-07 DIAGNOSIS — I4891 Unspecified atrial fibrillation: Secondary | ICD-10-CM

## 2013-04-07 DIAGNOSIS — J45909 Unspecified asthma, uncomplicated: Secondary | ICD-10-CM

## 2013-04-07 DIAGNOSIS — R34 Anuria and oliguria: Secondary | ICD-10-CM

## 2013-04-07 DIAGNOSIS — E119 Type 2 diabetes mellitus without complications: Secondary | ICD-10-CM | POA: Diagnosis present

## 2013-04-07 DIAGNOSIS — N179 Acute kidney failure, unspecified: Secondary | ICD-10-CM | POA: Diagnosis present

## 2013-04-07 DIAGNOSIS — K5730 Diverticulosis of large intestine without perforation or abscess without bleeding: Secondary | ICD-10-CM

## 2013-04-07 DIAGNOSIS — I129 Hypertensive chronic kidney disease with stage 1 through stage 4 chronic kidney disease, or unspecified chronic kidney disease: Secondary | ICD-10-CM | POA: Diagnosis present

## 2013-04-07 DIAGNOSIS — J9601 Acute respiratory failure with hypoxia: Secondary | ICD-10-CM

## 2013-04-07 DIAGNOSIS — N183 Chronic kidney disease, stage 3 unspecified: Secondary | ICD-10-CM

## 2013-04-07 DIAGNOSIS — I130 Hypertensive heart and chronic kidney disease with heart failure and stage 1 through stage 4 chronic kidney disease, or unspecified chronic kidney disease: Secondary | ICD-10-CM

## 2013-04-07 DIAGNOSIS — B9620 Unspecified Escherichia coli [E. coli] as the cause of diseases classified elsewhere: Secondary | ICD-10-CM

## 2013-04-07 DIAGNOSIS — F32A Depression, unspecified: Secondary | ICD-10-CM

## 2013-04-07 DIAGNOSIS — Z7901 Long term (current) use of anticoagulants: Secondary | ICD-10-CM

## 2013-04-07 HISTORY — DX: Heart failure, unspecified: I50.9

## 2013-04-07 LAB — COMPREHENSIVE METABOLIC PANEL
ALT: 19 U/L (ref 0–35)
BUN: 41 mg/dL — ABNORMAL HIGH (ref 6–23)
CO2: 26 mEq/L (ref 19–32)
Calcium: 10 mg/dL (ref 8.4–10.5)
Creatinine, Ser: 2.5 mg/dL — ABNORMAL HIGH (ref 0.50–1.10)
GFR calc Af Amer: 19 mL/min — ABNORMAL LOW (ref >90)
GFR calc non Af Amer: 16 mL/min — ABNORMAL LOW (ref >90)
Glucose, Bld: 167 mg/dL — ABNORMAL HIGH (ref 70–99)
Sodium: 137 mEq/L (ref 135–145)
Total Protein: 6.6 g/dL (ref 6.0–8.3)

## 2013-04-07 LAB — CBC WITH DIFFERENTIAL/PLATELET
Eosinophils Absolute: 0 10*3/uL (ref 0.0–0.7)
Eosinophils Relative: 0 % (ref 0–5)
HCT: 39.3 % (ref 36.0–46.0)
Lymphs Abs: 1.6 10*3/uL (ref 0.7–4.0)
MCH: 27.8 pg (ref 26.0–34.0)
MCV: 89.5 fL (ref 78.0–100.0)
Monocytes Absolute: 0.7 10*3/uL (ref 0.1–1.0)
Platelets: 178 10*3/uL (ref 150–400)
RBC: 4.39 MIL/uL (ref 3.87–5.11)

## 2013-04-07 LAB — PROTIME-INR: Prothrombin Time: 27.2 seconds — ABNORMAL HIGH (ref 11.6–15.2)

## 2013-04-07 LAB — LIPASE, BLOOD: Lipase: 132 U/L — ABNORMAL HIGH (ref 11–59)

## 2013-04-07 LAB — PRO B NATRIURETIC PEPTIDE: Pro B Natriuretic peptide (BNP): 10489 pg/mL — ABNORMAL HIGH (ref 0–450)

## 2013-04-07 MED ORDER — PROMETHAZINE HCL 25 MG/ML IJ SOLN
12.5000 mg | Freq: Once | INTRAMUSCULAR | Status: AC
  Administered 2013-04-07: 12.5 mg via INTRAVENOUS
  Filled 2013-04-07: qty 1

## 2013-04-07 MED ORDER — SODIUM CHLORIDE 0.9 % IV BOLUS (SEPSIS): 500.0000 mL | Freq: Once | INTRAVENOUS | Status: DC

## 2013-04-07 MED ORDER — SODIUM CHLORIDE 0.9 % IV SOLN
INTRAVENOUS | Status: DC
  Administered 2013-04-07: via INTRAVENOUS

## 2013-04-07 MED ORDER — SODIUM CHLORIDE 0.9 % IV SOLN: Freq: Once | INTRAVENOUS | Status: DC

## 2013-04-07 MED ORDER — ONDANSETRON HCL 4 MG/2ML IJ SOLN
4.0000 mg | Freq: Once | INTRAMUSCULAR | Status: AC
  Administered 2013-04-07: 4 mg via INTRAVENOUS
  Filled 2013-04-07: qty 2

## 2013-04-07 MED ORDER — SODIUM CHLORIDE 0.9 % IV BOLUS (SEPSIS)
500.0000 mL | Freq: Once | INTRAVENOUS | Status: AC
  Administered 2013-04-07: 500 mL via INTRAVENOUS

## 2013-04-07 MED ORDER — ONDANSETRON HCL 4 MG/2ML IJ SOLN: 4.0000 mg | Freq: Three times a day (TID) | INTRAMUSCULAR | Status: DC | PRN

## 2013-04-07 MED ORDER — FUROSEMIDE 10 MG/ML IJ SOLN: 80.0000 mg | Freq: Once | INTRAMUSCULAR | Status: DC

## 2013-04-07 NOTE — ED Notes (Signed)
Transported to xray 

## 2013-04-07 NOTE — ED Notes (Addendum)
Pt c/o vomiting since lunch time. Pt states she started taking abx for UTI x 2 days ago. Pt states she has had nausea with sulfa drugs previously and is currently taking septra.

## 2013-04-07 NOTE — ED Notes (Addendum)
In room to place foley and hang fluid. Pt and family asking why MD ordered these things. Told family I would have MD come explain before performing task. Dr. Manus Gunning made aware and will go in to talk with pt and family again.

## 2013-04-07 NOTE — ED Notes (Signed)
Pt refuses IV fluids. MD aware.

## 2013-04-07 NOTE — ED Notes (Signed)
Pt unable to void at this time. 

## 2013-04-07 NOTE — ED Provider Notes (Signed)
Scribed for Joanna Kief, MD, the patient was seen in room 6E26C/6E26C-01. This chart was scribed by Lewanda Rife, ED scribe. Patient's care was started at 2203  CSN: [id number]     Arrival date & time 04/07/13  2121 History   First MD Initiated Contact with Patient 04/07/13 2154     Chief Complaint  Patient presents with  . Emesis   (Consider location/radiation/quality/duration/timing/severity/associated sxs/prior Treatment) The history is provided by the patient, a relative and medical records.   HPI Comments: Joanna Salas is a 79 y.o. female who presents to the Emergency Department complaining of constant worsening nausea onset 6 days. Reports associated several episodes of emesis since noon today. Denies associated chest pain, constipation, diarrhea, abdominal pain, and fever. Reports still taking abx for UTI since hospital disposition last week. Reports nausea is aggravated by sulfa abx and alleviated by nothing. Hx of similar nausea secondary to sulfa abx. Daughter reports pt is in her care at home at this time. Denies taking any antiemetics at home PTA. Reports hx of Atrial fibrillation and currently on warfarin.  Past Medical History  Diagnosis Date  . Personal history of colonic polyps   . Allergic rhinitis, cause unspecified   . Unspecified asthma(493.90)   . Personal history of other diseases of digestive system   . Personal history of urinary calculi   . Osteoporosis, unspecified   . DJD (degenerative joint disease)   . HTN (hypertension)   . Hyperlipidemia   . Anxiety state, unspecified   . Mitral valve regurgitation, mod. on TEE 2004, & mild to mod TR, EF 60%. 01/20/2013  . Pelvic fracture, recent history of 01/20/2013  . Atrial fibrillation, permanent     Hx. of failed DCCV  . Diabetes mellitus without complication   . CHF (congestive heart failure)    Past Surgical History  Procedure Laterality Date  . Appendectomy    . Tonsillectomy    . Hemicolectomy       right   Family History  Problem Relation Age of Onset  . Heart disease Mother   . Heart disease Father    History  Substance Use Topics  . Smoking status: Never Smoker   . Smokeless tobacco: Never Used  . Alcohol Use: No   OB History   Grav Para Term Preterm Abortions TAB SAB Ect Mult Living                 Review of Systems  Gastrointestinal: Positive for nausea.   A complete 10 system review of systems was obtained and all systems are negative except as noted in the HPI and PMH.    Allergies  Pneumococcal vaccines; Atorvastatin; Ciprofloxacin hcl; Morphine sulfate; Penicillins; Prednisone; Sotalol hcl; and Sulfonamide derivatives  Home Medications   No current outpatient prescriptions on file. BP 120/69  Pulse 101  Temp(Src) 97.6 F (36.4 C) (Oral)  Resp 18  Ht 5\' 6"  (1.676 m)  Wt 145 lb 8.1 oz (66 kg)  BMI 23.5 kg/m2  SpO2 99% Physical Exam  Nursing note and vitals reviewed. Constitutional: She is oriented to person, place, and time. She appears well-developed and well-nourished. No distress.  Uncomfortable, dry heaving  HENT:  Head: Normocephalic and atraumatic.  Dry mucus membranes  Eyes: EOM are normal.  Neck: Neck supple. No tracheal deviation present.  Cardiovascular: Normal rate, regular rhythm, normal heart sounds and intact distal pulses.   No murmur heard. Pulmonary/Chest: Effort normal and breath sounds normal. No respiratory distress.  Abdominal:  Soft. Bowel sounds are normal. There is no tenderness.  Musculoskeletal: Normal range of motion. She exhibits no edema and no tenderness.  Neurological: She is alert and oriented to person, place, and time. No cranial nerve deficit. She exhibits normal muscle tone. Coordination normal.  Skin: Skin is warm and dry.  Psychiatric: She has a normal mood and affect. Her behavior is normal.    ED Course  Procedures (including critical care time) Medications  albuterol (PROVENTIL HFA;VENTOLIN HFA) 108  (90 BASE) MCG/ACT inhaler 2 puff (not administered)  ALPRAZolam (XANAX) tablet 0.25 mg (not administered)  alum & mag hydroxide-simeth (MAALOX/MYLANTA) 200-200-20 MG/5ML suspension 15 mL (not administered)  tamsulosin (FLOMAX) capsule 0.4 mg (0.4 mg Oral Given 04/08/13 0821)  verapamil (CALAN-SR) CR tablet 120 mg (120 mg Oral Given 04/08/13 1121)  citalopram (CELEXA) tablet 10 mg (10 mg Oral Given 04/08/13 1121)  loratadine (CLARITIN) tablet 10 mg (10 mg Oral Given 04/08/13 1121)  meclizine (ANTIVERT) tablet 25-50 mg (not administered)  metoprolol (LOPRESSOR) tablet 100 mg (100 mg Oral Given 04/08/13 1122)  pantoprazole (PROTONIX) EC tablet 40 mg (40 mg Oral Given 04/08/13 1122)  promethazine (PHENERGAN) tablet 12.5-25 mg (not administered)  acetaminophen (TYLENOL) tablet 650 mg (not administered)    Or  acetaminophen (TYLENOL) suppository 650 mg (not administered)  ondansetron (ZOFRAN) tablet 4 mg (not administered)    Or  ondansetron (ZOFRAN) injection 4 mg (not administered)  sodium chloride 0.9 % injection 3 mL (3 mLs Intravenous Given 04/08/13 1122)  0.9 %  sodium chloride infusion ( Intravenous New Bag/Given 04/08/13 0234)  warfarin (COUMADIN) tablet 1.25 mg (not administered)  Warfarin - Pharmacist Dosing Inpatient (not administered)  ondansetron (ZOFRAN) injection 4 mg (4 mg Intravenous Given 04/07/13 2217)  promethazine (PHENERGAN) injection 12.5 mg (12.5 mg Intravenous Given 04/07/13 2217)  sodium chloride 0.9 % bolus 500 mL (500 mLs Intravenous New Bag/Given 04/07/13 2336)  sodium polystyrene (KAYEXALATE) 15 GM/60ML suspension 30 g (30 g Oral Given 04/08/13 0232)    Labs Review Labs Reviewed  COMPREHENSIVE METABOLIC PANEL - Abnormal; Notable for the following:    Potassium 5.7 (*)    Glucose, Bld 167 (*)    BUN 41 (*)    Creatinine, Ser 2.50 (*)    GFR calc non Af Amer 16 (*)    GFR calc Af Amer 19 (*)    All other components within normal limits  LIPASE, BLOOD - Abnormal; Notable  for the following:    Lipase 132 (*)    All other components within normal limits  URINALYSIS, ROUTINE W REFLEX MICROSCOPIC - Abnormal; Notable for the following:    APPearance CLOUDY (*)    Protein, ur 30 (*)    Leukocytes, UA SMALL (*)    All other components within normal limits  PRO B NATRIURETIC PEPTIDE - Abnormal; Notable for the following:    Pro B Natriuretic peptide (BNP) 10489.0 (*)    All other components within normal limits  PROTIME-INR - Abnormal; Notable for the following:    Prothrombin Time 27.2 (*)    INR 2.63 (*)    All other components within normal limits  URINE MICROSCOPIC-ADD ON - Abnormal; Notable for the following:    Squamous Epithelial / LPF FEW (*)    Bacteria, UA MANY (*)    Casts HYALINE CASTS (*)    All other components within normal limits  BASIC METABOLIC PANEL - Abnormal; Notable for the following:    Glucose, Bld 112 (*)    BUN  37 (*)    Creatinine, Ser 2.12 (*)    GFR calc non Af Amer 20 (*)    GFR calc Af Amer 23 (*)    All other components within normal limits  CBC - Abnormal; Notable for the following:    Hemoglobin 11.0 (*)    HCT 35.3 (*)    Platelets 137 (*)    All other components within normal limits  PROTIME-INR - Abnormal; Notable for the following:    Prothrombin Time 27.5 (*)    INR 2.67 (*)    All other components within normal limits  CG4 I-STAT (LACTIC ACID) - Abnormal; Notable for the following:    Lactic Acid, Venous 3.25 (*)    All other components within normal limits  URINE CULTURE  CLOSTRIDIUM DIFFICILE BY PCR  CBC WITH DIFFERENTIAL  SODIUM, URINE, RANDOM  CREATININE, URINE, RANDOM  LACTIC ACID, PLASMA   Imaging Review Ct Abdomen Pelvis Wo Contrast  04/08/2013   *RADIOLOGY REPORT*  Clinical Data: Nausea.  CT ABDOMEN AND PELVIS WITHOUT CONTRAST  Technique:  Multidetector CT imaging of the abdomen and pelvis was performed following the standard protocol without intravenous contrast.  Comparison: 03/23/2013   Findings: There is a moderate right pleural effusion and a small left effusion.  There is overlying compressive type atelectasis associated with both effusions.  Heart size is enlarged.  There are calcifications involving the LAD and left circumflex coronary arteries.  No focal liver abnormalities identified.  There is mild perihepatic ascites and mild pericholecystic fluid.  Stones and/or sludge are layering within the dependent portion of the gallbladder.  No biliary dilatation.  The pancreas appears within normal limits. Normal appearance of the spleen.   The adrenal glands both appear normal.  Mild bilateral renal parenchymal volume loss is noted.  No hydronephrosis.  No hydroureter or evidence of ureterolithiasis.  Multiple tiny punctate renal calculi are identified bilaterally.  There are multiple left renal cysts of varying complexity.  Cyst within the upper pole the left kidney measures 2.5 cm and contains a small punctate focus of mural calcification, image 30/series 2. Similarly there is had a interpolar left renal cyst measuring 2.6 cm containing a small area of increased attenuation within the dependent wall.  These cysts are all incompletely characterized without IV contrast material.  The urinary bladder is collapsed around a Foley catheter balloon.  The uterus and the adnexal structures have a normal physiologic appearance for patient's age.  The stomach is normal.  The small bowel loops have a normal course and caliber without obstruction.  Normal appearance of the colon.  Review of the visualized osseous structures shows diffuse osteopenia. Healing fractures of the right superior and inferior pubic rami fractures are again identified.  Suspect insufficiency fractures involving bilateral sacral wings.  These are new when compared with 12/13/2011.  IMPRESSION:  1.  New bilateral pleural effusions and abdominal ascites. 2.  Suspect gallstones. 3.  Bilateral renal calculi without evidence for  obstructive uropathy.  4.  Multiple left renal cysts which are incompletely characterized without IV contrast. 5.  Healing of left superior inferior pubic rami fractures. 6.  Bilateral sacral insufficiency fractures.   Original Report Authenticated By: Signa Kell, M.D.   Dg Chest 2 View  04/08/2013   *RADIOLOGY REPORT*  Clinical Data: Emesis, nausea, vomiting, history of atrial fibrillation  CHEST - 2 VIEW  Comparison: 03/27/2013; chest CT - 01/19/2013  Findings:  Grossly unchanged enlarged cardiac silhouette and mediastinal contours. Chronic bilateral pleural effusions  with associated bibasilar opacities, right greater than left.  Chronic pulmonary venous congestion without frank evidence of edema.  No pneumothorax.  Grossly unchanged bones.   IMPRESSION: Chronic findings of pulmonary venous congestion and bilateral pleural effusions, right greater than left, without definite acute cardiopulmonary disease.   Original Report Authenticated By: Tacey Ruiz, MD    MDM   1. Acute renal failure   2. Lactic acidosis   3. Nausea   4. Weakness    Vomiting since 12 pm, nausea for the past several days thought to be due to bactrim. No fevers.  Recent hospitalization for CHF and urinary retention.  Abdomen soft without peritoneal signs. Patient given gentle IV hydration, antiemetics. Cr 2.5 from 1.  BUN 41. Appears prerenal.  Recent admission for IV diuresis.  Also put on bactrim as outpatient. Hyperkalemia without EKG changes.  Kayexalate given.  Lactic acidosis likely 2/2 dehydration.  No abdominal pain. Doubt mesenteric ischemia. Lipase 132.  Tenuous fluid balance with hx of diastolic CHF.  Hold lasix and gently hydrate for now. Admission dw Dr. Onalee Hua.  Date: 04/07/2013  Rate: 61  Rhythm: atrial fibrillation  QRS Axis: normal  Intervals: normal  ST/T Wave abnormalities: normal  Conduction Disutrbances:none  Narrative Interpretation:   Old EKG Reviewed: unchanged   I personally performed  the services described in this documentation, which was scribed in my presence. The recorded information has been reviewed and is accurate.    Glynn Octave, MD 04/08/13 1218

## 2013-04-07 NOTE — ED Notes (Signed)
MD at bedside. 

## 2013-04-07 NOTE — ED Notes (Signed)
Pt on abx for UTI approx 5-6 days-c/o vomiting since approx 12pm

## 2013-04-08 ENCOUNTER — Encounter (HOSPITAL_COMMUNITY): Payer: Self-pay | Admitting: Internal Medicine

## 2013-04-08 ENCOUNTER — Inpatient Hospital Stay (HOSPITAL_COMMUNITY): Payer: Medicare Other

## 2013-04-08 DIAGNOSIS — E872 Acidosis: Secondary | ICD-10-CM

## 2013-04-08 DIAGNOSIS — I509 Heart failure, unspecified: Secondary | ICD-10-CM

## 2013-04-08 DIAGNOSIS — R5381 Other malaise: Secondary | ICD-10-CM

## 2013-04-08 DIAGNOSIS — Q613 Polycystic kidney, unspecified: Secondary | ICD-10-CM

## 2013-04-08 DIAGNOSIS — R531 Weakness: Secondary | ICD-10-CM | POA: Diagnosis present

## 2013-04-08 DIAGNOSIS — Z5181 Encounter for therapeutic drug level monitoring: Secondary | ICD-10-CM

## 2013-04-08 DIAGNOSIS — K8020 Calculus of gallbladder without cholecystitis without obstruction: Secondary | ICD-10-CM

## 2013-04-08 DIAGNOSIS — Z7901 Long term (current) use of anticoagulants: Secondary | ICD-10-CM

## 2013-04-08 DIAGNOSIS — N179 Acute kidney failure, unspecified: Secondary | ICD-10-CM

## 2013-04-08 DIAGNOSIS — I5031 Acute diastolic (congestive) heart failure: Secondary | ICD-10-CM

## 2013-04-08 DIAGNOSIS — N200 Calculus of kidney: Secondary | ICD-10-CM

## 2013-04-08 DIAGNOSIS — R110 Nausea: Secondary | ICD-10-CM

## 2013-04-08 HISTORY — DX: Polycystic kidney, unspecified: Q61.3

## 2013-04-08 HISTORY — DX: Calculus of gallbladder without cholecystitis without obstruction: K80.20

## 2013-04-08 HISTORY — DX: Calculus of kidney: N20.0

## 2013-04-08 LAB — URINE MICROSCOPIC-ADD ON

## 2013-04-08 LAB — BASIC METABOLIC PANEL
CO2: 27 mEq/L (ref 19–32)
GFR calc non Af Amer: 20 mL/min — ABNORMAL LOW (ref >90)
Glucose, Bld: 112 mg/dL — ABNORMAL HIGH (ref 70–99)
Potassium: 5.1 mEq/L (ref 3.5–5.1)
Sodium: 139 mEq/L (ref 135–145)

## 2013-04-08 LAB — CBC
HCT: 35.3 % — ABNORMAL LOW (ref 36.0–46.0)
Hemoglobin: 11 g/dL — ABNORMAL LOW (ref 12.0–15.0)
MCHC: 31.2 g/dL (ref 30.0–36.0)
MCV: 87.4 fL (ref 78.0–100.0)
Platelets: 137 10*3/uL — ABNORMAL LOW (ref 150–400)
RBC: 4.04 MIL/uL (ref 3.87–5.11)
RDW: 15.3 % (ref 11.5–15.5)
WBC: 5.9 10*3/uL (ref 4.0–10.5)

## 2013-04-08 LAB — URINALYSIS, ROUTINE W REFLEX MICROSCOPIC
Nitrite: NEGATIVE
Protein, ur: 30 mg/dL — AB
Specific Gravity, Urine: 1.015 (ref 1.005–1.030)
Urobilinogen, UA: 1 mg/dL (ref 0.0–1.0)

## 2013-04-08 LAB — SODIUM, URINE, RANDOM: Sodium, Ur: 34 mEq/L

## 2013-04-08 LAB — PROTIME-INR: INR: 2.67 — ABNORMAL HIGH (ref 0.00–1.49)

## 2013-04-08 MED ORDER — WARFARIN - PHARMACIST DOSING INPATIENT
Freq: Every day | Status: DC
  Administered 2013-04-09: 17:00:00

## 2013-04-08 MED ORDER — CITALOPRAM HYDROBROMIDE 10 MG PO TABS
10.0000 mg | ORAL_TABLET | Freq: Every day | ORAL | Status: DC
  Administered 2013-04-08 – 2013-04-10 (×3): 10 mg via ORAL
  Filled 2013-04-08 (×3): qty 1

## 2013-04-08 MED ORDER — ALBUTEROL SULFATE HFA 108 (90 BASE) MCG/ACT IN AERS: 2.0000 | INHALATION_SPRAY | RESPIRATORY_TRACT | Status: DC | PRN

## 2013-04-08 MED ORDER — WARFARIN 1.25 MG HALF TABLET
1.2500 mg | ORAL_TABLET | Freq: Once | ORAL | Status: AC
  Administered 2013-04-08: 1.25 mg via ORAL
  Filled 2013-04-08: qty 1

## 2013-04-08 MED ORDER — PANTOPRAZOLE SODIUM 40 MG PO TBEC
40.0000 mg | DELAYED_RELEASE_TABLET | Freq: Every day | ORAL | Status: DC
  Administered 2013-04-08 – 2013-04-10 (×3): 40 mg via ORAL
  Filled 2013-04-08 (×3): qty 1

## 2013-04-08 MED ORDER — TAMSULOSIN HCL 0.4 MG PO CAPS
0.4000 mg | ORAL_CAPSULE | Freq: Every day | ORAL | Status: DC
  Administered 2013-04-08 – 2013-04-10 (×3): 0.4 mg via ORAL
  Filled 2013-04-08 (×4): qty 1

## 2013-04-08 MED ORDER — ALUM & MAG HYDROXIDE-SIMETH 200-200-20 MG/5ML PO SUSP
15.0000 mL | ORAL | Status: DC | PRN
  Filled 2013-04-08: qty 30

## 2013-04-08 MED ORDER — LORATADINE 10 MG PO TABS
10.0000 mg | ORAL_TABLET | Freq: Every day | ORAL | Status: DC
  Administered 2013-04-08 – 2013-04-10 (×3): 10 mg via ORAL
  Filled 2013-04-08 (×3): qty 1

## 2013-04-08 MED ORDER — ACETAMINOPHEN 325 MG PO TABS
650.0000 mg | ORAL_TABLET | Freq: Four times a day (QID) | ORAL | Status: DC | PRN
  Administered 2013-04-09: 650 mg via ORAL
  Filled 2013-04-08: qty 2

## 2013-04-08 MED ORDER — METRONIDAZOLE 500 MG PO TABS
500.0000 mg | ORAL_TABLET | Freq: Three times a day (TID) | ORAL | Status: DC
  Administered 2013-04-08 – 2013-04-10 (×6): 500 mg via ORAL
  Filled 2013-04-08 (×9): qty 1

## 2013-04-08 MED ORDER — SODIUM CHLORIDE 0.9 % IV SOLN
INTRAVENOUS | Status: AC
  Administered 2013-04-08: 03:00:00 via INTRAVENOUS

## 2013-04-08 MED ORDER — BACID PO TABS
2.0000 | ORAL_TABLET | Freq: Three times a day (TID) | ORAL | Status: DC
  Filled 2013-04-08 (×2): qty 2

## 2013-04-08 MED ORDER — METOPROLOL TARTRATE 100 MG PO TABS
100.0000 mg | ORAL_TABLET | Freq: Two times a day (BID) | ORAL | Status: DC
  Administered 2013-04-08 – 2013-04-09 (×4): 100 mg via ORAL
  Filled 2013-04-08 (×6): qty 1

## 2013-04-08 MED ORDER — SODIUM POLYSTYRENE SULFONATE 15 GM/60ML PO SUSP
30.0000 g | Freq: Once | ORAL | Status: AC
  Administered 2013-04-08: 30 g via ORAL
  Filled 2013-04-08: qty 120

## 2013-04-08 MED ORDER — RISAQUAD PO CAPS
2.0000 | ORAL_CAPSULE | Freq: Three times a day (TID) | ORAL | Status: DC
  Administered 2013-04-08 – 2013-04-10 (×6): 2 via ORAL
  Filled 2013-04-08 (×9): qty 2

## 2013-04-08 MED ORDER — ALPRAZOLAM 0.25 MG PO TABS
0.2500 mg | ORAL_TABLET | Freq: Every evening | ORAL | Status: DC | PRN
  Administered 2013-04-08 – 2013-04-09 (×2): 0.25 mg via ORAL
  Filled 2013-04-08 (×2): qty 1

## 2013-04-08 MED ORDER — SODIUM CHLORIDE 0.9 % IJ SOLN
3.0000 mL | Freq: Two times a day (BID) | INTRAMUSCULAR | Status: DC
  Administered 2013-04-08 – 2013-04-10 (×5): 3 mL via INTRAVENOUS

## 2013-04-08 MED ORDER — ONDANSETRON HCL 4 MG/2ML IJ SOLN: 4.0000 mg | Freq: Four times a day (QID) | INTRAMUSCULAR | Status: DC | PRN

## 2013-04-08 MED ORDER — ONDANSETRON HCL 4 MG PO TABS: 4.0000 mg | ORAL_TABLET | Freq: Four times a day (QID) | ORAL | Status: DC | PRN

## 2013-04-08 MED ORDER — PROMETHAZINE HCL 25 MG PO TABS: 12.5000 mg | ORAL_TABLET | Freq: Every evening | ORAL | Status: DC | PRN

## 2013-04-08 MED ORDER — MECLIZINE HCL 25 MG PO TABS
25.0000 mg | ORAL_TABLET | Freq: Three times a day (TID) | ORAL | Status: DC | PRN
  Filled 2013-04-08: qty 2

## 2013-04-08 MED ORDER — ACETAMINOPHEN 650 MG RE SUPP: 650.0000 mg | Freq: Four times a day (QID) | RECTAL | Status: DC | PRN

## 2013-04-08 MED ORDER — VERAPAMIL HCL ER 120 MG PO TBCR
120.0000 mg | EXTENDED_RELEASE_TABLET | Freq: Every day | ORAL | Status: DC
  Administered 2013-04-08 – 2013-04-09 (×2): 120 mg via ORAL
  Filled 2013-04-08 (×3): qty 1

## 2013-04-08 NOTE — H&P (Addendum)
[facility] History and Physical  GRETNA BERGIN WJX:[id number] DOB: 11/04/1924 DOA: 04/07/2013  Referring physician: ER physician. PCP: Arlan Organ., MD  Specialists: [facility].  Chief Complaint: Weakness and nausea.  HPI: Joanna Salas is a 79 y.o. female with history of diastolic CHF last EF measured in June of this year 5560%, A. fib on Coumadin, chronic kidney disease stage III, hypertension was brought to the ER by patient's daughter after patient was having nausea and feeling weak. Patient was feeling weak for last 2-3 days with poor appetite and nausea. Denies any abdominal pain. Patient also had couple of episodes of diarrhea. Patient was dismissed her on Bactrim for possible UTI by patient's urologist 3 days ago. In the ER patient was found to have a elevated creatinine from her baseline. Patient was placed on IV fluid boluses and admitted for further management. On my exam patient's abdomen is benign. Denies any chest pain or shortness of breath. Patient has been having couple episodes of diarrhea last 2 days. Her nausea has improved.  Patient was recently admitted for CHF exacerbation and was subsequently discharged to rehabilitation after which patient presently is living in her daughter's house. Patient was also recently admitted for closed pelvic fracture and was managed conservatively.  Review of Systems: As presented in the history of presenting illness, rest negative.  Past Medical History  Diagnosis Date  . Personal history of colonic polyps   . Allergic rhinitis, cause unspecified   . Unspecified asthma(493.90)   . Personal history of other diseases of digestive system   . Personal history of urinary calculi   . Osteoporosis, unspecified   . DJD (degenerative joint disease)   . HTN (hypertension)   . Hyperlipidemia   . Anxiety state, unspecified   . Mitral valve regurgitation, mod. on TEE 2004, & mild to mod TR, EF 60%. 01/20/2013   . Pelvic fracture, recent history of 01/20/2013  . Atrial fibrillation, permanent     Hx. of failed DCCV  . Diabetes mellitus without complication   . CHF (congestive heart failure)    Past Surgical History  Procedure Laterality Date  . Appendectomy    . Tonsillectomy    . Hemicolectomy      right   Social History:  reports that she has never smoked. She has never used smokeless tobacco. She reports that she does not drink alcohol or use illicit drugs. Home. where does patient live-- Can do ADLs. Can patient participate in ADLs?  Allergies  Allergen Reactions  . Pneumococcal Vaccines   . Atorvastatin Other (See Comments)    Severe Leg Cramps  . Ciprofloxacin Hcl Nausea And Vomiting  . Morphine Sulfate Nausea Only  . Penicillins     REACTION: unspecified  . Prednisone     unknown  . Sotalol Hcl   . Sulfonamide Derivatives Nausea Only    Family History  Problem Relation Age of Onset  . Heart disease Mother   . Heart disease Father       Prior to Admission medications   Medication Sig Start Date End Date Taking? Authorizing Provider  Sulfamethoxazole-Trimethoprim (SEPTRA PO) Take by mouth.   Yes Historical Provider, MD  acetaminophen (TYLENOL) 500 MG tablet Take 1,000 mg by mouth every 6 (six) hours as needed for pain.    Historical Provider, MD  albuterol (PROVENTIL HFA;VENTOLIN HFA) 108 (90 BASE) MCG/ACT inhaler Inhale 2 puffs into the lungs every 4 (four) hours as needed for wheezing or shortness of  breath.    Historical Provider, MD  ALPRAZolam Prudy Feeler) 0.25 MG tablet Take 0.25 mg by mouth at bedtime as needed for sleep.    Historical Provider, MD  alum & mag hydroxide-simeth (MAALOX/MYLANTA) 200-200-20 MG/5ML suspension Take 15 mLs by mouth every 4 (four) hours as needed for indigestion.    Historical Provider, MD  cetirizine (ZYRTEC) 10 MG tablet Take 10 mg by mouth at bedtime.    Historical Provider, MD  citalopram (CELEXA) 10 MG tablet Take 1 tablet (10 mg total) by  mouth daily. 02/10/13   Abelino Derrick, PA-C  furosemide (LASIX) 40 MG tablet Take 1 tablet (40 mg total) by mouth daily. 03/20/13   Abelino Derrick, PA-C  meclizine (ANTIVERT) 25 MG tablet Take 25-50 mg by mouth 3 (three) times daily as needed for dizziness.    Historical Provider, MD  metoprolol (LOPRESSOR) 100 MG tablet Take 1 tablet (100 mg total) by mouth 2 (two) times daily. 01/16/13   Pamella Pert, MD  pantoprazole (PROTONIX) 40 MG tablet Take 1 tablet (40 mg total) by mouth daily. 01/14/13   Marinda Elk, MD  polyethylene glycol [facility] / Ethelene Hal) packet Take 17 g by mouth daily. 01/11/13   Marinda Elk, MD  potassium chloride SA (K-DUR,KLOR-CON) 20 MEQ tablet Take 1 tablet (20 mEq total) by mouth daily. 03/20/13   Abelino Derrick, PA-C  promethazine (PHENERGAN) 12.5 MG tablet Take 12.5-25 mg by mouth at bedtime as needed for nausea (dizziness).    Historical Provider, MD  raloxifene (EVISTA) 60 MG tablet Take 60 mg by mouth daily.      Historical Provider, MD  sennosides-docusate sodium (SENOKOT-S) 8.6-50 MG tablet Take 1 tablet by mouth at bedtime as needed for constipation.    Historical Provider, MD  tamsulosin (FLOMAX) 0.4 MG CAPS capsule Take 1 capsule (0.4 mg total) by mouth daily after breakfast. 03/28/13   Abelino Derrick, PA-C  verapamil (CALAN-SR) 120 MG CR tablet Take 1 tablet (120 mg total) by mouth daily. 03/28/13   Abelino Derrick, PA-C  warfarin (COUMADIN) 2.5 MG tablet Take 1.25-2.5 mg by mouth daily. 0.5 tab on Sun, Tues, and Thurs; 1 tab all other days.    Historical Provider, MD   Physical Exam: Filed Vitals:   04/07/13 2245 04/07/13 2248 04/07/13 2345 04/08/13 0125  BP: 133/60 126/56 141/55 145/68  Pulse: 66 68 68 70  Temp:   98.2 F (36.8 C) 98 F (36.7 C)  TempSrc:   Oral Oral  Resp:   20 18  Height:    5\' 6"  (1.676 m)  Weight:    66 kg (145 lb 8.1 oz)  SpO2:   96% 96%     General:  Well-developed well-nourished.  Eyes: Anicteric no pallor.  ENT: No  discharge from the ears eyes nose mouth.  Neck: No mass felt.  Cardiovascular: S1-S2 heard.  Respiratory: No rhonchi or crepitations.  Abdomen: Soft nontender bowel sounds present.  Skin: No rash.  Musculoskeletal: No edema.  Psychiatric: Appears normal.  Neurologic: Alert awake oriented to time place and person. Moves all extremities.  Labs on Admission:  Basic Metabolic Panel:  Recent Labs Lab 04/07/13 2141  NA 137  K 5.7*  CL 98  CO2 26  GLUCOSE 167*  BUN 41*  CREATININE 2.50*  CALCIUM 10.0   Liver Function Tests:  Recent Labs Lab 04/07/13 2141  AST 32  ALT 19  ALKPHOS 92  BILITOT 0.5  PROT 6.6  ALBUMIN 3.6  Recent Labs Lab 04/07/13 2210  LIPASE 132*   No results found for this basename: AMMONIA,  in the last 168 hours CBC:  Recent Labs Lab 04/07/13 2141  WBC 8.7  NEUTROABS 6.3  HGB 12.2  HCT 39.3  MCV 89.5  PLT 178   Cardiac Enzymes: No results found for this basename: CKTOTAL, CKMB, CKMBINDEX, TROPONINI,  in the last 168 hours  BNP (last 3 results)  Recent Labs  03/23/13 1248 03/26/13 0545 04/07/13 2210  PROBNP 5835.0* 3496.0* 10489.0*   CBG: No results found for this basename: GLUCAP,  in the last 168 hours  Radiological Exams on Admission: Dg Chest 2 View  04/08/2013   *RADIOLOGY REPORT*  Clinical Data: Emesis, nausea, vomiting, history of atrial fibrillation  CHEST - 2 VIEW  Comparison: 03/27/2013; chest CT - 01/19/2013  Findings:  Grossly unchanged enlarged cardiac silhouette and mediastinal contours. Chronic bilateral pleural effusions with associated bibasilar opacities, right greater than left.  Chronic pulmonary venous congestion without frank evidence of edema.  No pneumothorax.  Grossly unchanged bones.   IMPRESSION: Chronic findings of pulmonary venous congestion and bilateral pleural effusions, right greater than left, without definite acute cardiopulmonary disease.   Original Report Authenticated By: Tacey Ruiz, MD     EKG: Independently reviewed. Atrial fibrillation rate controlled.  Assessment/Plan Principal Problem:   Weakness Active Problems:   Atrial fibrillation, permanent   Renal failure (ARF), acute on chronic   Nausea   1. Acute on chronic kidney disease with hyperkalemia - patient did receive 1 L normal saline bolus in the ER. At this time I have ordered for gentle hydration for 5 more hours. Hold Lasix and discontinue potassium for now. Closely follow her respiratory status given patient's history of CHF. Closely follow intake output and metabolic panel. Renal failure could be due to poor oral intake and in addition use of Bactrim. Discontinue Bactrim. 2. Nausea and poor oral intake with weakness - not sure of what is causing the nausea. Check CT abdomen without contrast. Weakness probably from dehydration. 3. CHF - presently looks compensated. Closely follow intake output and metabolic panel. Presently Lasix on hold due to renal failure. 4. Atrial fibrillation on Coumadin - rate controlled. Coumadin per pharmacy. 5. Diarrhea - check C. difficile.  Patient is UA shows possibility of UTI but I have not started any antibiotics. Follow urine cultures.  Code Status: Full code.  Family Communication: Patient's daughter at the bedside.  Disposition Plan: Admit to inpatient.    Terence Googe N. [facility] Pager [phone].  If 7PM-7AM, please contact night-coverage [email or web address] Password [facility] 04/08/2013, 2:17 AM

## 2013-04-08 NOTE — ED Notes (Signed)
Daughter asking to speak with MD again. MD made aware.

## 2013-04-08 NOTE — Progress Notes (Deleted)
admit  To  Room 6e  26 Orientation  To  Room  Call bell  Safety plan  Medications and procedures Mental  Status alert and  oriented Living arrangements from home with daughter /prior home alone Skin intact Family  In attendance / daughter Assessment  See  Doc flow  sheet

## 2013-04-08 NOTE — Progress Notes (Signed)
admit  To  Room 6E  [facility] from [facility] Orientation  To  Room  Call bell  Safety plan and  Medications and  procedures Mental  Status alert and oriented  X 3 Living arrangements from home with daughter, prior at home  alone Family  In attendance / daughter Assessment  See  Doc flow  sheet

## 2013-04-08 NOTE — Progress Notes (Signed)
Utilization review completed.  

## 2013-04-08 NOTE — Progress Notes (Addendum)
H & P from this AM reviewed. I agree with the assessment and plan as noted  Pt presented to the ED with complaints of weakness in the setting of decreased appetite and nausea.  1. Acute on chronic kidney disease with hyperkalemia  1. Improved overnight 2. patient did receive 1 L normal saline bolus in the ER.  3. Cont hydration as tolerated.  4. Cont to hold Lasix and discontinue potassium for now.  5. Closely follow her respiratory status given patient's history of CHF.  6. Closely follow intake output and metabolic panel. 2. Nausea and poor oral intake with weakness  1. Check CT abdomen without contrast . Weakness probably from dehydration. 3. CHF  1. presently compensated 2. Closely follow intake output and metabolic panel.  3. Lasix on hold due to renal failure 4. Atrial fibrillation on Coumadin  1. rate controlled 2. Cont coumadin per pharmacy. 5. Diarrhea 1. C. Difficile is positive 2. Will treat with PO flagyl and start probiotic 6. DVT prophylaxis 1. On therapeutic anticoagulation

## 2013-04-08 NOTE — ED Notes (Signed)
MD at bedside. 

## 2013-04-08 NOTE — Progress Notes (Signed)
ANTICOAGULATION CONSULT NOTE - Initial Consult  Pharmacy Consult for coumadin Indication: atrial fibrillation  Allergies  Allergen Reactions  . Pneumococcal Vaccines   . Atorvastatin Other (See Comments)    Severe Leg Cramps  . Ciprofloxacin Hcl Nausea And Vomiting  . Morphine Sulfate Nausea Only  . Penicillins     REACTION: unspecified  . Prednisone     unknown  . Sotalol Hcl   . Sulfonamide Derivatives Nausea Only    Patient Measurements: Height: 5\' 6"  (167.6 cm) Weight: 145 lb 8.1 oz (66 kg) IBW/kg (Calculated) : 59.3   Vital Signs: Temp: 98 F (36.7 C) (08/26 0125) Temp src: Oral (08/26 0125) BP: 145/68 mmHg (08/26 0125) Pulse Rate: 70 (08/26 0125)  Labs:  Recent Labs  04/07/13 2141 04/07/13 2210  HGB 12.2  --   HCT 39.3  --   PLT 178  --   LABPROT  --  27.2*  INR  --  2.63*  CREATININE 2.50*  --     Estimated Creatinine Clearance: 15.1 ml/min (by C-G formula based on Cr of 2.5).   Medical History: Past Medical History  Diagnosis Date  . Personal history of colonic polyps   . Allergic rhinitis, cause unspecified   . Unspecified asthma(493.90)   . Personal history of other diseases of digestive system   . Personal history of urinary calculi   . Osteoporosis, unspecified   . DJD (degenerative joint disease)   . HTN (hypertension)   . Hyperlipidemia   . Anxiety state, unspecified   . Mitral valve regurgitation, mod. on TEE 2004, & mild to mod TR, EF 60%. 01/20/2013  . Pelvic fracture, recent history of 01/20/2013  . Atrial fibrillation, permanent     Hx. of failed DCCV  . Diabetes mellitus without complication   . CHF (congestive heart failure)     Medications:  Prescriptions prior to admission  Medication Sig Dispense Refill  . Sulfamethoxazole-Trimethoprim (SEPTRA PO) Take by mouth.      Marland Kitchen acetaminophen (TYLENOL) 500 MG tablet Take 1,000 mg by mouth every 6 (six) hours as needed for pain.      Marland Kitchen albuterol (PROVENTIL HFA;VENTOLIN HFA) 108 (90  BASE) MCG/ACT inhaler Inhale 2 puffs into the lungs every 4 (four) hours as needed for wheezing or shortness of breath.      . ALPRAZolam (XANAX) 0.25 MG tablet Take 0.25 mg by mouth at bedtime as needed for sleep.      Marland Kitchen alum & mag hydroxide-simeth (MAALOX/MYLANTA) 200-200-20 MG/5ML suspension Take 15 mLs by mouth every 4 (four) hours as needed for indigestion.      . cetirizine (ZYRTEC) 10 MG tablet Take 10 mg by mouth at bedtime.      . citalopram (CELEXA) 10 MG tablet Take 1 tablet (10 mg total) by mouth daily.  30 tablet  5  . furosemide (LASIX) 40 MG tablet Take 1 tablet (40 mg total) by mouth daily.  90 tablet  2  . meclizine (ANTIVERT) 25 MG tablet Take 25-50 mg by mouth 3 (three) times daily as needed for dizziness.      . metoprolol (LOPRESSOR) 100 MG tablet Take 1 tablet (100 mg total) by mouth 2 (two) times daily.  60 tablet  0  . pantoprazole (PROTONIX) 40 MG tablet Take 1 tablet (40 mg total) by mouth daily.      . polyethylene glycol (MIRALAX / GLYCOLAX) packet Take 17 g by mouth daily.  14 each  0  . potassium chloride SA (K-DUR,KLOR-CON)  20 MEQ tablet Take 1 tablet (20 mEq total) by mouth daily.  30 tablet  11  . promethazine (PHENERGAN) 12.5 MG tablet Take 12.5-25 mg by mouth at bedtime as needed for nausea (dizziness).      . raloxifene (EVISTA) 60 MG tablet Take 60 mg by mouth daily.        . sennosides-docusate sodium (SENOKOT-S) 8.6-50 MG tablet Take 1 tablet by mouth at bedtime as needed for constipation.      . tamsulosin (FLOMAX) 0.4 MG CAPS capsule Take 1 capsule (0.4 mg total) by mouth daily after breakfast.  30 capsule  5  . verapamil (CALAN-SR) 120 MG CR tablet Take 1 tablet (120 mg total) by mouth daily.      Marland Kitchen warfarin (COUMADIN) 2.5 MG tablet Take 1.25-2.5 mg by mouth daily. 0.5 tab on Sun, Tues, and Thurs; 1 tab all other days.        Assessment: Mrs. Cerone is an 79 yo F to continue her coumadin for afib per pharmacy protocol.  Her SNF coumadin dose is 2.5 mg  MWFSat and 1.25 mg TTSunday.   Her admission INR is therapeutic at 2.63.  Her CBC is WNL.  She was started on Septra as an outpt for a UTI which interacts with coumadin elevating the INR.  Per ED RN note pt on abx for UTI approx 5-6 days.    Goal of Therapy:  INR 2-3    Plan:  1. Coumadin 1.25 mg po x 1 today as per her home dose 2. Daily INR Herby Abraham, Pharm.D. [phone] 04/08/2013 3:02 AM

## 2013-04-09 LAB — BASIC METABOLIC PANEL
BUN: 22 mg/dL (ref 6–23)
Chloride: 100 mEq/L (ref 96–112)
Creatinine, Ser: 1.25 mg/dL — ABNORMAL HIGH (ref 0.50–1.10)
Glucose, Bld: 88 mg/dL (ref 70–99)
Potassium: 4.7 mEq/L (ref 3.5–5.1)

## 2013-04-09 LAB — URINE CULTURE: Colony Count: 9000

## 2013-04-09 MED ORDER — WARFARIN SODIUM 2.5 MG PO TABS
2.5000 mg | ORAL_TABLET | ORAL | Status: DC
  Filled 2013-04-09: qty 1

## 2013-04-09 MED ORDER — WARFARIN 1.25 MG HALF TABLET
1.2500 mg | ORAL_TABLET | ORAL | Status: DC
  Administered 2013-04-09: 1.25 mg via ORAL
  Filled 2013-04-09: qty 1

## 2013-04-09 NOTE — Progress Notes (Signed)
Pt's foley catheter removed at 1100 this morning. Pt voided a large amount of urine around 1600 (amount not documented, mixed with stool). Bladder scanned pt at approx 1615 with results of . Pt states she feels like she emptied her bladder effectively. Will continue to monitor.

## 2013-04-09 NOTE — Evaluation (Signed)
Physical Therapy Evaluation Patient Details Name: Joanna Salas MRN: [id number] DOB: [birth year] Today's Date: 04/09/2013 Time: 4098-1191 PT Time Calculation (min): 12 min  PT Assessment / Plan / Recommendation History of Present Illness  79 y.o. female with history of diastolic CHF last EF measured in June of this year 5560%, A. fib on Coumadin, chronic kidney disease stage III, hypertension was brought to the ER by patient's daughter after patient was having nausea and feeling weak. Patient was feeling weak for last 2-3 days with poor appetite and nausea. Denies any abdominal pain. Patient also had couple of episodes of diarrhea. Patient was dismissed her on Bactrim for possible UTI by patient's urologist 3 days ago. In the ER patient was found to have a elevated creatinine from her baseline. Patient was placed on IV fluid boluses and admitted for further management. On my exam patient's abdomen is benign. Denies any chest pain or shortness of breath. Patient has been having couple episodes of diarrhea last 2 days. Her nausea has improved.  Also with recent admission for CHF and conservatively treated for closed pelvic fx    Clinical Impression  Pt admitted with above. Pt currently with functional limitations due to the deficits listed below (see PT Problem List).  Pt will benefit from skilled PT to increase their independence and safety with mobility to allow discharge to the venue listed below.  Pt reports recent d/c from SNF to daughter's home and she is hopeful to get back to her house soon.  Pt reports increased fatigue, SOB and LE weakness during ambulation and encouraged pt to ambulate with staff during acute stay.     PT Assessment  Patient needs continued PT services    Follow Up Recommendations  Home health PT;Supervision for mobility/OOB    Does the patient have the potential to tolerate intense rehabilitation      Barriers to Discharge        Equipment Recommendations  None  recommended by PT    Recommendations for Other Services     Frequency Min 3X/week    Precautions / Restrictions Precautions Precautions: Fall Restrictions Weight Bearing Restrictions: No   Pertinent Vitals/Pain SaO2 93% room air after ambulation      Mobility  Bed Mobility Bed Mobility: Supine to Sit;Sit to Supine Supine to Sit: 6: Modified independent (Device/Increase time);HOB elevated;With rails Sitting - Scoot to Edge of Bed: 6: Modified independent (Device/Increase time);With rail Details for Bed Mobility Assistance: increased time Transfers Transfers: Sit to Stand;Stand to Sit Sit to Stand: 4: Min guard;With upper extremity assist;From bed Stand to Sit: With upper extremity assist;4: Min guard;To bed Details for Transfer Assistance: verbal cues for safe technique Ambulation/Gait Ambulation/Gait Assistance: 4: Min guard Ambulation Distance (Feet): 160 Feet Assistive device: Rolling walker Ambulation/Gait Assistance Details: pt reports LE fatigue and weakness, also SOB end of ambulation, SaO2 93% on room air Gait Pattern: Step-through pattern;Decreased stride length;Trunk flexed General Gait Details: verbal cue for RW distance    Exercises     PT Diagnosis: Generalized weakness  PT Problem List: Decreased strength;Decreased activity tolerance;Decreased mobility PT Treatment Interventions: DME instruction;Gait training;Functional mobility training;Therapeutic activities;Therapeutic exercise;Patient/family education     PT Goals(Current goals can be found in the care plan section) Acute Rehab PT Goals PT Goal Formulation: With patient Time For Goal Achievement: 04/23/13 Potential to Achieve Goals: Good  Visit Information  Last PT Received On: 04/09/13 Assistance Needed: +1 History of Present Illness: 79 y.o. female with history of diastolic CHF last EF  measured in June of this year 5560%, A. fib on Coumadin, chronic kidney disease stage III, hypertension was  brought to the ER by patient's daughter after patient was having nausea and feeling weak. Patient was feeling weak for last 2-3 days with poor appetite and nausea. Denies any abdominal pain. Patient also had couple of episodes of diarrhea. Patient was dismissed her on Bactrim for possible UTI by patient's urologist 3 days ago. In the ER patient was found to have a elevated creatinine from her baseline. Patient was placed on IV fluid boluses and admitted for further management. On my exam patient's abdomen is benign. Denies any chest pain or shortness of breath. Patient has been having couple episodes of diarrhea last 2 days. Her nausea has improved.  Also with recent admission for CHF and conservatively treated for closed pelvic fx        Prior Functioning  Home Living Family/patient expects to be discharged to:: Private residence Living Arrangements: Children Available Help at Discharge: Family;Available 24 hours/day Type of Home: House Home Access: Ramped entrance Home Layout: One level Home Equipment: Cane - single point;Shower seat;Grab bars - tub/shower;Bedside commode Additional Comments: recently d/c from SNF to daughter's home Prior Function Level of Independence: Independent with assistive device(s);Needs assistance ADL's / Homemaking Assistance Needed: Does not do IADLs Communication Communication: No difficulties Dominant Hand: Right    Cognition  Cognition Arousal/Alertness: Awake/alert Behavior During Therapy: WFL for tasks assessed/performed Overall Cognitive Status: Within Functional Limits for tasks assessed    Extremity/Trunk Assessment Lower Extremity Assessment Lower Extremity Assessment: Generalized weakness   Balance    End of Session PT - End of Session Activity Tolerance: Patient limited by fatigue Patient left: in bed;with call bell/phone within reach;with bed alarm set  GP     [name],KATHrine E 04/09/2013, 2:34 PM Zenovia Jarred, PT, DPT 04/09/2013 Pager:  [phone]

## 2013-04-09 NOTE — Progress Notes (Signed)
ANTICOAGULATION CONSULT NOTE  Pharmacy Consult for coumadin Indication: atrial fibrillation  Allergies  Allergen Reactions  . Pneumococcal Vaccines   . Atorvastatin Other (See Comments)    Severe Leg Cramps  . Ciprofloxacin Hcl Nausea And Vomiting  . Morphine Sulfate Nausea Only  . Penicillins Nausea And Vomiting  . Prednisone Other (See Comments)    Weight gain and md said shouldn't take with afib meds  . Sotalol Hcl Nausea And Vomiting  . Sulfonamide Derivatives Nausea Only    Patient Measurements: Height: 5\' 6"  (167.6 cm) Weight: 147 lb 14.9 oz (67.1 kg) IBW/kg (Calculated) : 59.3   Vital Signs: Temp: 98.6 F (37 C) (08/27 0600) Temp src: Oral (08/27 0600) BP: 135/82 mmHg (08/27 0600) Pulse Rate: 110 (08/27 0600)  Labs:  Recent Labs  04/07/13 2141 04/07/13 2210 04/08/13 0440 04/09/13 0635  HGB 12.2  --  11.0*  --   HCT 39.3  --  35.3*  --   PLT 178  --  137*  --   LABPROT  --  27.2* 27.5* 28.7*  INR  --  2.63* 2.67* 2.82*  CREATININE 2.50*  --  2.12*  --     Estimated Creatinine Clearance: 17.8 ml/min (by C-G formula based on Cr of 2.12).   Medical History: Past Medical History  Diagnosis Date  . Personal history of colonic polyps   . Allergic rhinitis, cause unspecified   . Unspecified asthma(493.90)   . Personal history of other diseases of digestive system   . Personal history of urinary calculi   . Osteoporosis, unspecified   . DJD (degenerative joint disease)   . HTN (hypertension)   . Hyperlipidemia   . Anxiety state, unspecified   . Mitral valve regurgitation, mod. on TEE 2004, & mild to mod TR, EF 60%. 01/20/2013  . Pelvic fracture, recent history of 01/20/2013  . Atrial fibrillation, permanent     Hx. of failed DCCV  . Diabetes mellitus without complication   . CHF (congestive heart failure)     Medications:  Prescriptions prior to admission  Medication Sig Dispense Refill  . acetaminophen (TYLENOL) 500 MG tablet Take 1,000 mg by  mouth every 6 (six) hours as needed for pain.      Marland Kitchen albuterol (PROVENTIL HFA;VENTOLIN HFA) 108 (90 BASE) MCG/ACT inhaler Inhale 2 puffs into the lungs every 4 (four) hours as needed for wheezing or shortness of breath.      . ALPRAZolam (XANAX) 0.25 MG tablet Take 0.25 mg by mouth at bedtime as needed for sleep.      . cetirizine (ZYRTEC) 10 MG tablet Take 10 mg by mouth at bedtime.      . citalopram (CELEXA) 10 MG tablet Take 1 tablet (10 mg total) by mouth daily.  30 tablet  5  . furosemide (LASIX) 40 MG tablet Take 1 tablet (40 mg total) by mouth daily.  90 tablet  2  . metoprolol (LOPRESSOR) 50 MG tablet Take 75 mg by mouth 2 (two) times daily.      . ondansetron (ZOFRAN) 4 MG tablet Take 4 mg by mouth every 6 (six) hours as needed for nausea.      . pantoprazole (PROTONIX) 40 MG tablet Take 40 mg by mouth daily as needed (heartburn/indigestion).      . potassium chloride SA (K-DUR,KLOR-CON) 20 MEQ tablet Take 20 mEq by mouth 2 (two) times daily.      . promethazine (PHENERGAN) 12.5 MG tablet Take 12.5-25 mg by mouth at bedtime as  needed for nausea (dizziness).      . ranitidine (ZANTAC) 150 MG tablet Take 150 mg by mouth daily as needed for heartburn (indigestion).      . sennosides-docusate sodium (SENOKOT-S) 8.6-50 MG tablet Take 1 tablet by mouth at bedtime as needed for constipation.      Marland Kitchen trimethoprim (TRIMPEX) 100 MG tablet Take 100 mg by mouth 2 (two) times daily. 7 day course filled 04/03/13      . verapamil (CALAN-SR) 240 MG CR tablet Take 240 mg by mouth daily.      Marland Kitchen warfarin (COUMADIN) 2.5 MG tablet Take 1.25-2.5 mg by mouth daily. 0.5 tab on Sun, Tues, and Thurs; 1 tab all other days.        Assessment: Joanna Salas is an 79 yo F to continue her coumadin for afib per pharmacy protocol.  Her SNF coumadin dose is 2.5 mg MWFSat and 1.25 mg TTSunday.   Her admission INR is therapeutic at 2.63.  Her CBC is WNL.   INR today = 2.82, started on Flagyl  Goal of Therapy:  INR 2-3     Plan:  1. Decrease Coumadin dose since started Flagyl - 1.25 mg MWFSat, 2.5 mg TTSunday 2. Continue to follow daily INR  Thank you. [name], PharmD [phone]

## 2013-04-09 NOTE — Progress Notes (Signed)
[facility]                                                                                Patient Demographics  Joanna Salas, is a 79 y.o. female, DOB - 1925/08/11, WUJ:[id number], NFA:[id number]  Admit date - 04/07/2013  Admitting Physician Eduard Clos, MD  Outpatient Primary MD for the patient is MANNING, Adelene Amas., MD  LOS - 2   Chief Complaint  Patient presents with  . Emesis        Assessment & Plan    Acute on chronic kidney disease with hyperkalemia -  Secondary to dehydration and prerenal azotemia caused by C. difficile colitis and diarrhea, renal function much improved after IV fluids and hydration, continue to hold nephrotoxins including ACE/ARB and diuretics. Monitor with gentle hydration.     Diarrhea along with Nausea and poor oral intake with weakness -  Secondary to colitis caused by C. difficile, symptomatically much improved, diarrhea nausea vomiting almost completely resolved, tolerating oral diet continue to monitor closely with supportive care. Along with oral Flagyl.     History of chronic diastolic grade 1 CHF recent EF is 55%  She is clinically compensated from CHF standpoint, monitor with supportive care.     Atrial fibrillation on Coumadin -   rate controlled on beta blocker and verapamil, Coumadin monitored by pharmacy. Goal is rate controlled.     Urinary retention  Continue Flomax, try to remove Foley and monitor.     Code Status: Full  Family Communication: Daughter bedside  Disposition Plan: Home with home health   Procedures CT scan abdomen pelvis   Consults      DVT Prophylaxis  Coumadin  Lab Results  Component Value Date   INR 2.82* 04/09/2013   INR 2.67* 04/08/2013   INR 2.63* 04/07/2013     Lab Results  Component Value Date   PLT 137* 04/08/2013    Medications  Scheduled Meds: . acidophilus  2 capsule Oral TID  . citalopram  10 mg Oral Daily  . loratadine  10 mg Oral Daily  .  metoprolol  100 mg Oral BID  . metroNIDAZOLE  500 mg Oral Q8H  . pantoprazole  40 mg Oral Daily  . sodium chloride  3 mL Intravenous Q12H  . tamsulosin  0.4 mg Oral QPC breakfast  . verapamil  120 mg Oral Daily  . warfarin  1.25 mg Oral Custom  . [START ON 04/10/2013] warfarin  2.5 mg Oral Custom  . Warfarin - Pharmacist Dosing Inpatient   Does not apply q1800   Continuous Infusions:  PRN Meds:.acetaminophen, acetaminophen, albuterol, ALPRAZolam, alum & mag hydroxide-simeth, meclizine, ondansetron (ZOFRAN) IV, ondansetron, promethazine  Antibiotics    Anti-infectives   Start     Dose/Rate Route Frequency Ordered Stop   04/08/13 1500  metroNIDAZOLE (FLAGYL) tablet 500 mg     500 mg Oral 3 times per day 04/08/13 1432         Time Spent in minutes   35   Chaunice Obie K M.D on 04/09/2013 at 11:48 AM  Between 7am to 7pm - Pager - [phone]  After 7pm go to [email or web address] - password [email or web address]  And look  for the night coverage person covering for me after hours  [facility] Office  [phone]    Subjective:   Darenda Fike today has, No headache, No chest pain, No abdominal pain - No Nausea, No new weakness tingling or numbness, No Cough - SOB. Almost resolved diarrhea.  Objective:   Filed Vitals:   04/08/13 1836 04/08/13 2054 04/09/13 0600 04/09/13 1021  BP: 103/50 111/68 135/82 132/72  Pulse: 93 101 110 78  Temp: 98.2 F (36.8 C) 98.5 F (36.9 C) 98.6 F (37 C) 98 F (36.7 C)  TempSrc: Oral Oral Oral Oral  Resp: 18 18 18 18   Height:      Weight:  67.1 kg (147 lb 14.9 oz)    SpO2: 98% 96% 95% 94%    Wt Readings from Last 3 Encounters:  04/08/13 67.1 kg (147 lb 14.9 oz)  03/28/13 66.86 kg (147 lb 6.4 oz)  03/20/13 72.53 kg (159 lb 14.4 oz)     Intake/Output Summary (Last 24 hours) at 04/09/13 1148 Last data filed at 04/09/13 0900  Gross per 24 hour  Intake    720 ml  Output    550 ml  Net    170 ml    Exam Awake Alert, Oriented X 3, No  new F.N deficits, Normal affect [place].AT,PERRAL Supple Neck,No JVD, No cervical lymphadenopathy appriciated.  Symmetrical Chest wall movement, Good air movement bilaterally, CTAB RRR,No Gallops,Rubs or new Murmurs, No Parasternal Heave +ve B.Sounds, Abd Soft, Non tender, No organomegaly appriciated, No rebound - guarding or rigidity. No Cyanosis, Clubbing or edema, No new Rash or bruise      Data Review   Micro Results Recent Results (from the past 240 hour(s))  URINE CULTURE     Status: None   Collection Time    04/07/13 11:44 PM      Result Value Range Status   Specimen Description URINE, CATHETERIZED   Final   Special Requests NONE   Final   Culture  Setup Time     Final   Value: 04/08/2013 08:50     Performed at [facility] Count     Final   Value: 9,000 COLONIES/ML     Performed at [facility]   Culture     Final   Value: INSIGNIFICANT GROWTH     Performed at [facility]   Report Status 04/09/2013 FINAL   Final  CLOSTRIDIUM DIFFICILE BY PCR     Status: Abnormal   Collection Time    04/08/13 10:00 AM      Result Value Range Status   C difficile by pcr POSITIVE (*) NEGATIVE Final   Comment: CRITICAL RESULT CALLED TO, READ BACK BY AND VERIFIED WITH:     A. MINTZ RN 13:55 04/08/13 ([email or web address])    Radiology Reports Ct Abdomen Pelvis Wo Contrast  04/08/2013   *RADIOLOGY REPORT*  Clinical Data: Nausea.  CT ABDOMEN AND PELVIS WITHOUT CONTRAST  Technique:  Multidetector CT imaging of the abdomen and pelvis was performed following the standard protocol without intravenous contrast.  Comparison: 03/23/2013  Findings: There is a moderate right pleural effusion and a small left effusion.  There is overlying compressive type atelectasis associated with both effusions.  Heart size is enlarged.  There are calcifications involving the LAD and left circumflex coronary arteries.  No focal liver abnormalities identified.  There is mild perihepatic ascites and  mild pericholecystic fluid.  Stones and/or sludge are layering within the dependent portion  of the gallbladder.  No biliary dilatation.  The pancreas appears within normal limits. Normal appearance of the spleen.   The adrenal glands both appear normal.  Mild bilateral renal parenchymal volume loss is noted.  No hydronephrosis.  No hydroureter or evidence of ureterolithiasis.  Multiple tiny punctate renal calculi are identified bilaterally.  There are multiple left renal cysts of varying complexity.  Cyst within the upper pole the left kidney measures 2.5 cm and contains a small punctate focus of mural calcification, image 30/series 2. Similarly there is had a interpolar left renal cyst measuring 2.6 cm containing a small area of increased attenuation within the dependent wall.  These cysts are all incompletely characterized without IV contrast material.  The urinary bladder is collapsed around a Foley catheter balloon.  The uterus and the adnexal structures have a normal physiologic appearance for patient's age.  The stomach is normal.  The small bowel loops have a normal course and caliber without obstruction.  Normal appearance of the colon.  Review of the visualized osseous structures shows diffuse osteopenia. Healing fractures of the right superior and inferior pubic rami fractures are again identified.  Suspect insufficiency fractures involving bilateral sacral wings.  These are new when compared with 12/13/2011.  IMPRESSION:  1.  New bilateral pleural effusions and abdominal ascites. 2.  Suspect gallstones. 3.  Bilateral renal calculi without evidence for obstructive uropathy.  4.  Multiple left renal cysts which are incompletely characterized without IV contrast. 5.  Healing of left superior inferior pubic rami fractures. 6.  Bilateral sacral insufficiency fractures.   Original Report Authenticated By: Signa Kell, M.D.   Dg Chest 2 View  04/08/2013   *RADIOLOGY REPORT*  Clinical Data: Emesis, nausea,  vomiting, history of atrial fibrillation  CHEST - 2 VIEW  Comparison: 03/27/2013; chest CT - 01/19/2013  Findings:  Grossly unchanged enlarged cardiac silhouette and mediastinal contours. Chronic bilateral pleural effusions with associated bibasilar opacities, right greater than left.  Chronic pulmonary venous congestion without frank evidence of edema.  No pneumothorax.  Grossly unchanged bones.   IMPRESSION: Chronic findings of pulmonary venous congestion and bilateral pleural effusions, right greater than left, without definite acute cardiopulmonary disease.   Original Report Authenticated By: Tacey Ruiz, MD       CBC  Recent Labs Lab 04/07/13 2141 04/08/13 0440  WBC 8.7 5.9  HGB 12.2 11.0*  HCT 39.3 35.3*  PLT 178 137*  MCV 89.5 87.4  MCH 27.8 27.2  MCHC 31.0 31.2  RDW 15.3 15.3  LYMPHSABS 1.6  --   MONOABS 0.7  --   EOSABS 0.0  --   BASOSABS 0.0  --     Chemistries   Recent Labs Lab 04/07/13 2141 04/08/13 0440  NA 137 139  K 5.7* 5.1  CL 98 104  CO2 26 27  GLUCOSE 167* 112*  BUN 41* 37*  CREATININE 2.50* 2.12*  CALCIUM 10.0 9.0  AST 32  --   ALT 19  --   ALKPHOS 92  --   BILITOT 0.5  --    ------------------------------------------------------------------------------------------------------------------ estimated creatinine clearance is 17.8 ml/min (by C-G formula based on Cr of 2.12). ------------------------------------------------------------------------------------------------------------------ No results found for this basename: HGBA1C,  in the last 72 hours ------------------------------------------------------------------------------------------------------------------ No results found for this basename: CHOL, HDL, LDLCALC, TRIG, CHOLHDL, LDLDIRECT,  in the last 72 hours ------------------------------------------------------------------------------------------------------------------ No results found for this basename: TSH, T4TOTAL, FREET3, T3FREE,  THYROIDAB,  in the last 72 hours ------------------------------------------------------------------------------------------------------------------ No results found for this  basename: VITAMINB12, FOLATE, FERRITIN, TIBC, IRON, RETICCTPCT,  in the last 72 hours  Coagulation profile  Recent Labs Lab 04/07/13 2210 04/08/13 0440 04/09/13 0635  INR 2.63* 2.67* 2.82*    No results found for this basename: DDIMER,  in the last 72 hours  Cardiac Enzymes No results found for this basename: CK, CKMB, TROPONINI, MYOGLOBIN,  in the last 168 hours ------------------------------------------------------------------------------------------------------------------ No components found with this basename: POCBNP,

## 2013-04-10 LAB — BASIC METABOLIC PANEL
CO2: 30 mEq/L (ref 19–32)
Glucose, Bld: 79 mg/dL (ref 70–99)
Potassium: 4.9 mEq/L (ref 3.5–5.1)
Sodium: 137 mEq/L (ref 135–145)

## 2013-04-10 MED ORDER — VERAPAMIL HCL ER 120 MG PO TBCR
120.0000 mg | EXTENDED_RELEASE_TABLET | Freq: Every day | ORAL | Status: DC
  Administered 2013-04-10: 120 mg via ORAL
  Filled 2013-04-10: qty 1

## 2013-04-10 MED ORDER — TAMSULOSIN HCL 0.4 MG PO CAPS: 0.4000 mg | ORAL_CAPSULE | Freq: Every day | ORAL | Status: DC

## 2013-04-10 MED ORDER — METRONIDAZOLE 500 MG PO TABS: 500.0000 mg | ORAL_TABLET | Freq: Three times a day (TID) | ORAL | Status: DC

## 2013-04-10 MED ORDER — METOPROLOL TARTRATE 100 MG PO TABS
100.0000 mg | ORAL_TABLET | Freq: Two times a day (BID) | ORAL | Status: DC
  Administered 2013-04-10: 100 mg via ORAL
  Filled 2013-04-10 (×3): qty 1

## 2013-04-10 NOTE — Progress Notes (Signed)
Discharge instructions reviewed with pt and pt's daughter. Reviewed medications - including new meds. Handouts given to patient on flagyl and flomax. PIV removed. Telemetry removed. Pt to be discharged to home with home health. Pt discharged via w/c at 1230. Kathlene November, Joanna Salas [place]

## 2013-04-10 NOTE — Progress Notes (Signed)
Pt c/o SOB and DOE at about 1030. Dr. Thedore Mins notified who asked to have pt ambulate in the hall and then check pulse ox levels. Pt ambulated from her room to nurse's station and then back to her room and pulse ox on RA was 89%. Pulse ox instantly went up to 97% RA after sitting down for a minute. Dr. Thedore Mins notified. No new orders. No need for home O2 at this time, per Dr. Thedore Mins. Will monitor.

## 2013-04-10 NOTE — Progress Notes (Signed)
ANTICOAGULATION CONSULT NOTE  Pharmacy Consult for coumadin Indication: atrial fibrillation  Allergies  Allergen Reactions  . Pneumococcal Vaccines   . Atorvastatin Other (See Comments)    Severe Leg Cramps  . Ciprofloxacin Hcl Nausea And Vomiting  . Morphine Sulfate Nausea Only  . Penicillins Nausea And Vomiting  . Prednisone Other (See Comments)    Weight gain and md said shouldn't take with afib meds  . Sotalol Hcl Nausea And Vomiting  . Sulfonamide Derivatives Nausea Only    Labs:  Recent Labs  04/07/13 2141  04/08/13 0440 04/09/13 0635 04/09/13 1130 04/10/13 0550  HGB 12.2  --  11.0*  --   --   --   HCT 39.3  --  35.3*  --   --   --   PLT 178  --  137*  --   --   --   LABPROT  --   < > 27.5* 28.7*  --  25.1*  INR  --   < > 2.67* 2.82*  --  2.37*  CREATININE 2.50*  --  2.12*  --  1.25* 1.11*  < > = values in this interval not displayed.  Estimated Creatinine Clearance: 35.4 ml/min (by C-G formula based on Cr of 1.11).  Assessment: Joanna Salas is an 79 yo F to continue her coumadin for afib per pharmacy protocol.  Her SNF coumadin dose is 2.5 mg MWFSat and 1.25 mg TTSunday.   Her admission INR is therapeutic at 2.63.  Her CBC is WNL.   INR today = 2.37, started on Flagyl, which may increase INR  Goal of Therapy:  INR 2-3  Plan:  1. Continue Coumadin 1.25 mg MWFSat, 2.5 mg TTSunday 2. Continue to follow daily INR  Thank you. [name], PharmD [phone]

## 2013-04-10 NOTE — Discharge Summary (Addendum)
[facility]                                                                                   Joanna Salas, is a 79 y.o. female  DOB 09/07/1924  MRN [id number].  Admission date:  04/07/2013  Discharge Date:  04/10/2013  Primary MD  MANNING, Adelene Amas., MD  Admitting Physician  Eduard Clos, MD  Admission Diagnosis  Lactic acidosis [276.2] Acute renal failure [584.9]  Discharge Diagnosis     Principal Problem:   Weakness Active Problems:   Atrial fibrillation, permanent   Renal failure (ARF), acute on chronic   Nausea      Past Medical History  Diagnosis Date  . Personal history of colonic polyps   . Allergic rhinitis, cause unspecified   . Unspecified asthma(493.90)   . Personal history of other diseases of digestive system   . Personal history of urinary calculi   . Osteoporosis, unspecified   . DJD (degenerative joint disease)   . HTN (hypertension)   . Hyperlipidemia   . Anxiety state, unspecified   . Mitral valve regurgitation, mod. on TEE 2004, & mild to mod TR, EF 60%. 01/20/2013  . Pelvic fracture, recent history of 01/20/2013  . Atrial fibrillation, permanent     Hx. of failed DCCV  . Diabetes mellitus without complication   . CHF (congestive heart failure)     Past Surgical History  Procedure Laterality Date  . Appendectomy    . Tonsillectomy    . Hemicolectomy      right     Recommendations for primary care physician for things to follow:   Please follow CBC BMP closely   Discharge Diagnoses:   Principal Problem:   Weakness Active Problems:   Atrial fibrillation, permanent   Renal failure (ARF), acute on chronic   Nausea    Discharge Condition: Stable   Diet recommendation: See Discharge Instructions below   Consults    History of present illness and  Hospital Course:     Kindly see H&P for history of present illness and admission details, please review complete Labs, Consult reports and Test reports for all details  in brief Joanna Salas, is a 79 y.o. female, patient with history of diarrhea data C. difficile colitis causing dehydration, prerenal azotemia and acute on chronic renal failure. She was treated with Flagyl with good results her stools already formed and diarrhea is much improved, she will be provided with 10 more days of Flagyl supply.   Her acute renal failure on chronic kidney disease stage III has resolved after IV fluids for hydration, she will resume her home medications at this time, note that she is on a diuretic at home, will request primary care physician to monitor her weight and BMP closely and adjust diuretic and blood pressure medications as needed.   History of chronic grade 1 diastolic CHF EF 55% she is compensated from the standpoint. She also has underlying atrial fibrillation for which she is on accommodation a pickup walker and verapamil along with Coumadin which will be continued unchanged .   She had developed mild urinary retention which is resolved after a couple  of doses of Flomax, Foley catheter was removed yesterday and she is voiding without any difficulties. I will provide her with one more week supply of Flomax.   She will be discharged home she will be living with her daughter and she will get home PT and RN.   She admitted in the hallway without much problems, pulse ox was over 90% on room air.         Today   Subjective:   Mailen Newborn today has no headache,no chest abdominal pain,no new weakness tingling or numbness, feels much better wants to go home today.    Objective:   Blood pressure 135/79, pulse 100, temperature 98.1 F (36.7 C), temperature source Oral, resp. rate 22, height 5\' 7"  (1.702 m), weight 67.087 kg (147 lb 14.4 oz), SpO2 95.00%.   Intake/Output Summary (Last 24 hours) at 04/10/13 1104 Last data filed at 04/10/13 0835  Gross per 24 hour  Intake    540 ml  Output   1120 ml  Net   -580 ml    Exam Awake Alert, Oriented  *3, No new F.N deficits, Normal affect Sentinel.AT,PERRAL Supple Neck,No JVD, No cervical lymphadenopathy appriciated.  Symmetrical Chest wall movement, Good air movement bilaterally, CTAB RRR,No Gallops,Rubs or new Murmurs, No Parasternal Heave +ve B.Sounds, Abd Soft, Non tender, No organomegaly appriciated, No rebound -guarding or rigidity. No Cyanosis, Clubbing or edema, No new Rash or bruise  Data Review   Major procedures and Radiology Reports - PLEASE review detailed and final reports for all details, in brief -       Ct Abdomen Pelvis Wo Contrast  04/08/2013   *RADIOLOGY REPORT*  Clinical Data: Nausea.  CT ABDOMEN AND PELVIS WITHOUT CONTRAST  Technique:  Multidetector CT imaging of the abdomen and pelvis was performed following the standard protocol without intravenous contrast.  Comparison: 03/23/2013  Findings: There is a moderate right pleural effusion and a small left effusion.  There is overlying compressive type atelectasis associated with both effusions.  Heart size is enlarged.  There are calcifications involving the LAD and left circumflex coronary arteries.  No focal liver abnormalities identified.  There is mild perihepatic ascites and mild pericholecystic fluid.  Stones and/or sludge are layering within the dependent portion of the gallbladder.  No biliary dilatation.  The pancreas appears within normal limits. Normal appearance of the spleen.   The adrenal glands both appear normal.  Mild bilateral renal parenchymal volume loss is noted.  No hydronephrosis.  No hydroureter or evidence of ureterolithiasis.  Multiple tiny punctate renal calculi are identified bilaterally.  There are multiple left renal cysts of varying complexity.  Cyst within the upper pole the left kidney measures 2.5 cm and contains a small punctate focus of mural calcification, image 30/series 2. Similarly there is had a interpolar left renal cyst measuring 2.6 cm containing a small area of increased attenuation  within the dependent wall.  These cysts are all incompletely characterized without IV contrast material.  The urinary bladder is collapsed around a Foley catheter balloon.  The uterus and the adnexal structures have a normal physiologic appearance for patient's age.  The stomach is normal.  The small bowel loops have a normal course and caliber without obstruction.  Normal appearance of the colon.  Review of the visualized osseous structures shows diffuse osteopenia. Healing fractures of the right superior and inferior pubic rami fractures are again identified.  Suspect insufficiency fractures involving bilateral sacral wings.  These are new when compared with 12/13/2011.  IMPRESSION:  1.  New bilateral pleural effusions and abdominal ascites. 2.  Suspect gallstones. 3.  Bilateral renal calculi without evidence for obstructive uropathy.  4.  Multiple left renal cysts which are incompletely characterized without IV contrast. 5.  Healing of left superior inferior pubic rami fractures. 6.  Bilateral sacral insufficiency fractures.   Original Report Authenticated By: Signa Kell, M.D.   Dg Chest 2 View  04/08/2013   *RADIOLOGY REPORT*  Clinical Data: Emesis, nausea, vomiting, history of atrial fibrillation  CHEST - 2 VIEW  Comparison: 03/27/2013; chest CT - 01/19/2013  Findings:  Grossly unchanged enlarged cardiac silhouette and mediastinal contours. Chronic bilateral pleural effusions with associated bibasilar opacities, right greater than left.  Chronic pulmonary venous congestion without frank evidence of edema.  No pneumothorax.  Grossly unchanged bones.   IMPRESSION: Chronic findings of pulmonary venous congestion and bilateral pleural effusions, right greater than left, without definite acute cardiopulmonary disease.   Original Report Authenticated By: Tacey Ruiz, MD   Dg Chest 2 View  03/23/2013   *RADIOLOGY REPORT*  Clinical Data: 79 year old female with shortness of breath and swelling.  CHEST - 2  VIEW  Comparison: 01/19/2013 and prior radiographs  Findings: Cardiomegaly and pulmonary vascular congestion noted. A moderate right and small left pleural effusion noted. Atelectasis/opacity within the mid and lower right lung noted. There is no evidence of pneumothorax. No acute bony abnormalities are present.  IMPRESSION: Moderate right and small left pleural effusions with mid and lower right lung atelectasis/airspace opacity.  Cardiomegaly with pulmonary vascular congestion.   Original Report Authenticated By: Harmon Pier, M.D.   US Renal  03/23/2013   *RADIOLOGY REPORT*  Clinical Data: Decreased urine output  RENAL/URINARY TRACT ULTRASOUND COMPLETE  Comparison:  12/13/2011 CT  Findings:  Right Kidney:  10.3 cm in length.  Increased cortical echogenicity. No hydronephrosis.  No definite mass.  Left Kidney:  11.0 cm in length.  Increased cortical echogenicity. Ill-defined cortical medullary junction.  Multiple benign-appearing cysts.  The largest is in the interpolar region measuring 2.7 cm.  Bladder:  Within normal limits.  IMPRESSION: No hydronephrosis.  Increased cortical echogenicity and chronic changes compatible with chronic medical renal parenchymal disease.  Benign cysts in the left kidney.   Original Report Authenticated By: Jolaine Click, M.D.   Dg Chest Port 1 View  03/27/2013   CLINICAL DATA:  Hypoxia. Pleural effusions.  EXAM: PORTABLE CHEST - 1 VIEW  COMPARISON:  03/23/2013  FINDINGS: Persistent small pleural effusions are seen, right side greater than left. Associated bibasilar atelectasis noted. No definite evidence of pulmonary consolidation or edema. Cardiomegaly is stable.  IMPRESSION: No significant change in small pleural effusions and bibasilar atelectasis, right side greater than left.   Electronically Signed   By: Myles Rosenthal   On: 03/27/2013 15:16    Micro Results     Lab Results  Component Value Date   INR 2.37* 04/10/2013   INR 2.82* 04/09/2013   INR 2.67* 04/08/2013      Recent Results (from the past 240 hour(s))  URINE CULTURE     Status: None   Collection Time    04/07/13 11:44 PM      Result Value Range Status   Specimen Description URINE, CATHETERIZED   Final   Special Requests NONE   Final   Culture  Setup Time     Final   Value: 04/08/2013 08:50     Performed at [facility] Count  Final   Value: 9,000 COLONIES/ML     Performed at [facility]   Culture     Final   Value: INSIGNIFICANT GROWTH     Performed at [facility]   Report Status 04/09/2013 FINAL   Final  CLOSTRIDIUM DIFFICILE BY PCR     Status: Abnormal   Collection Time    04/08/13 10:00 AM      Result Value Range Status   C difficile by pcr POSITIVE (*) NEGATIVE Final   Comment: CRITICAL RESULT CALLED TO, READ BACK BY AND VERIFIED WITH:     A. MINTZ RN 13:55 04/08/13 ([email or web address])     CBC w Diff: Lab Results  Component Value Date   WBC 5.9 04/08/2013   HGB 11.0* 04/08/2013   HCT 35.3* 04/08/2013   PLT 137* 04/08/2013   LYMPHOPCT 19 04/07/2013   MONOPCT 8 04/07/2013   EOSPCT 0 04/07/2013   BASOPCT 0 04/07/2013    CMP: Lab Results  Component Value Date   NA 137 04/10/2013   K 4.9 04/10/2013   CL 101 04/10/2013   CO2 30 04/10/2013   BUN 19 04/10/2013   CREATININE 1.11* 04/10/2013   PROT 6.6 04/07/2013   ALBUMIN 3.6 04/07/2013   BILITOT 0.5 04/07/2013   ALKPHOS 92 04/07/2013   AST 32 04/07/2013   ALT 19 04/07/2013  .   Discharge Instructions     Follow with Primary MD MANNING, Adelene Amas., MD in 3 days   Get CBC, CMP, INR checked 3 days by Primary MD and again as instructed by your Primary MD. Get a 2 view Chest X ray done next visit .  Get Medicines reviewed and adjusted.  Please request your Prim.MD to go over all Hospital Tests and Procedure/Radiological results at the follow up, please get all Hospital records sent to your Prim MD by signing hospital release before you go home.  Activity: As tolerated with Full fall precautions use  walker/cane & assistance as needed   Diet:  Heart healthy  For Heart failure patients - Check your Weight same time everyday, if you gain over 2 pounds, or you develop in leg swelling, experience more shortness of breath or chest pain, call your Primary MD immediately. Follow Cardiac Low Salt Diet and 1.8 lit/day fluid restriction.  Disposition Home   If you experience worsening of your admission symptoms, develop shortness of breath, life threatening emergency, suicidal or homicidal thoughts you must seek medical attention immediately by calling 911 or calling your MD immediately  if symptoms less severe.  You Must read complete instructions/literature along with all the possible adverse reactions/side effects for all the Medicines you take and that have been prescribed to you. Take any new Medicines after you have completely understood and accpet all the possible adverse reactions/side effects.   Do not drive and provide baby sitting services if your were admitted for syncope or siezures until you have seen by Primary MD or a Neurologist and advised to do so again.  Do not drive when taking Pain medications.    Do not take more than prescribed Pain, Sleep and Anxiety Medications  Special Instructions: If you have smoked or chewed Tobacco  in the last 2 yrs please stop smoking, stop any regular Alcohol  and or any Recreational drug use.  Wear Seat belts while driving.   Please note  You were cared for by a hospitalist during your hospital stay. If you have any questions about your discharge medications or the  care you received while you were in the hospital after you are discharged, you can call the unit and asked to speak with the hospitalist on call if the hospitalist that took care of you is not available. Once you are discharged, your primary care physician will handle any further medical issues. Please note that NO REFILLS for any discharge medications will be authorized once you are  discharged, as it is imperative that you return to your primary care physician (or establish a relationship with a primary care physician if you do not have one) for your aftercare needs so that they can reassess your need for medications and monitor your lab values.    Follow-up Information   Follow up with MANNING, Adelene Amas., MD. Schedule an appointment as soon as possible for a visit in 3 days.   Specialty:  Family Medicine   Contact information:   [address] [place] [address] [phone]         Discharge Medications     Medication List    STOP taking these medications       trimethoprim 100 MG tablet  Commonly known as:  TRIMPEX      TAKE these medications       acetaminophen 500 MG tablet  Commonly known as:  TYLENOL  Take 1,000 mg by mouth every 6 (six) hours as needed for pain.     albuterol 108 (90 BASE) MCG/ACT inhaler  Commonly known as:  PROVENTIL HFA;VENTOLIN HFA  Inhale 2 puffs into the lungs every 4 (four) hours as needed for wheezing or shortness of breath.     ALPRAZolam 0.25 MG tablet  Commonly known as:  XANAX  Take 0.25 mg by mouth at bedtime as needed for sleep.     cetirizine 10 MG tablet  Commonly known as:  ZYRTEC  Take 10 mg by mouth at bedtime.     citalopram 10 MG tablet  Commonly known as:  CELEXA  Take 1 tablet (10 mg total) by mouth daily.     furosemide 40 MG tablet  Commonly known as:  LASIX  Take 1 tablet (40 mg total) by mouth daily.     metoprolol 50 MG tablet  Commonly known as:  LOPRESSOR  Take 75 mg by mouth 2 (two) times daily.     metroNIDAZOLE 500 MG tablet  Commonly known as:  FLAGYL  Take 1 tablet (500 mg total) by mouth every 8 (eight) hours.     ondansetron 4 MG tablet  Commonly known as:  ZOFRAN  Take 4 mg by mouth every 6 (six) hours as needed for nausea.     pantoprazole 40 MG tablet  Commonly known as:  PROTONIX  Take 40 mg by mouth daily as needed (heartburn/indigestion).     potassium  chloride SA 20 MEQ tablet  Commonly known as:  K-DUR,KLOR-CON  Take 20 mEq by mouth 2 (two) times daily.     promethazine 12.5 MG tablet  Commonly known as:  PHENERGAN  Take 12.5-25 mg by mouth at bedtime as needed for nausea (dizziness).     ranitidine 150 MG tablet  Commonly known as:  ZANTAC  Take 150 mg by mouth daily as needed for heartburn (indigestion).     sennosides-docusate sodium 8.6-50 MG tablet  Commonly known as:  SENOKOT-S  Take 1 tablet by mouth at bedtime as needed for constipation.     verapamil 240 MG CR tablet  Commonly known as:  CALAN-SR  Take 240 mg  by mouth daily.     warfarin 2.5 MG tablet  Commonly known as:  COUMADIN  Take 1.25-2.5 mg by mouth daily. 0.5 tab on Sun, Tues, and Thurs; 1 tab all other days.           Total Time in preparing paper work, data evaluation and todays exam - 35 minutes  Leroy Sea M.D on 04/10/2013 at 11:04 AM  [facility] Office  [phone]

## 2013-04-10 NOTE — Progress Notes (Signed)
Talked to patient with daughter present about home health care choices; patient chose [facility]; [facility] with [facility] called for arrangements. Patient will be staying with her daughter after discharge. [name] (daughter) [address] [place]ie Cooley - tele # [phone]

## 2013-04-17 ENCOUNTER — Ambulatory Visit (INDEPENDENT_AMBULATORY_CARE_PROVIDER_SITE_OTHER): Payer: Medicare Other | Admitting: Pharmacist Clinician (PhC)/ Clinical Pharmacy Specialist

## 2013-04-17 ENCOUNTER — Ambulatory Visit (INDEPENDENT_AMBULATORY_CARE_PROVIDER_SITE_OTHER): Payer: Medicare Other | Admitting: Cardiovascular Disease

## 2013-04-17 ENCOUNTER — Ambulatory Visit (INDEPENDENT_AMBULATORY_CARE_PROVIDER_SITE_OTHER): Payer: Medicare Other | Admitting: Family Medicine

## 2013-04-17 ENCOUNTER — Encounter: Payer: Self-pay | Admitting: Family Medicine

## 2013-04-17 ENCOUNTER — Ambulatory Visit: Payer: Medicare Other | Admitting: Pharmacist Clinician (PhC)/ Clinical Pharmacy Specialist

## 2013-04-17 ENCOUNTER — Encounter: Payer: Self-pay | Admitting: Cardiovascular Disease

## 2013-04-17 VITALS — BP 102/72 | HR 68 | Ht 66.0 in | Wt 150.0 lb

## 2013-04-17 VITALS — BP 112/67 | HR 79 | Temp 98.6°F | Resp 18 | Ht 64.5 in | Wt 150.0 lb

## 2013-04-17 DIAGNOSIS — I4891 Unspecified atrial fibrillation: Secondary | ICD-10-CM

## 2013-04-17 DIAGNOSIS — I38 Endocarditis, valve unspecified: Secondary | ICD-10-CM

## 2013-04-17 DIAGNOSIS — Z7901 Long term (current) use of anticoagulants: Secondary | ICD-10-CM

## 2013-04-17 DIAGNOSIS — I509 Heart failure, unspecified: Secondary | ICD-10-CM

## 2013-04-17 DIAGNOSIS — A0472 Enterocolitis due to Clostridium difficile, not specified as recurrent: Secondary | ICD-10-CM

## 2013-04-17 DIAGNOSIS — R319 Hematuria, unspecified: Secondary | ICD-10-CM

## 2013-04-17 DIAGNOSIS — R310 Gross hematuria: Secondary | ICD-10-CM

## 2013-04-17 DIAGNOSIS — I4821 Permanent atrial fibrillation: Secondary | ICD-10-CM

## 2013-04-17 DIAGNOSIS — N183 Chronic kidney disease, stage 3 unspecified: Secondary | ICD-10-CM

## 2013-04-17 DIAGNOSIS — I5032 Chronic diastolic (congestive) heart failure: Secondary | ICD-10-CM

## 2013-04-17 LAB — URINALYSIS, ROUTINE W REFLEX MICROSCOPIC
Nitrite: POSITIVE
Total Protein, Urine: NEGATIVE
Urine Glucose: NEGATIVE
pH: 5.5 (ref 5.0–8.0)

## 2013-04-17 LAB — CBC WITH DIFFERENTIAL/PLATELET
Basophils Absolute: 0 10*3/uL (ref 0.0–0.1)
Eosinophils Absolute: 0 10*3/uL (ref 0.0–0.7)
Lymphocytes Relative: 20.2 % (ref 12.0–46.0)
MCHC: 31.3 g/dL (ref 30.0–36.0)
Monocytes Relative: 9.3 % (ref 3.0–12.0)
Neutro Abs: 4.7 10*3/uL (ref 1.4–7.7)
Neutrophils Relative %: 69.6 % (ref 43.0–77.0)
Platelets: 192 10*3/uL (ref 150.0–400.0)
RDW: 16.8 % — ABNORMAL HIGH (ref 11.5–14.6)

## 2013-04-17 LAB — BASIC METABOLIC PANEL
CO2: 33 mEq/L — ABNORMAL HIGH (ref 19–32)
Chloride: 101 mEq/L (ref 96–112)
Creatinine, Ser: 1.1 mg/dL (ref 0.4–1.2)
Potassium: 3.8 mEq/L (ref 3.5–5.1)
Sodium: 138 mEq/L (ref 135–145)

## 2013-04-17 LAB — POCT INR: INR: 3.4

## 2013-04-17 LAB — PROTIME-INR
INR: 3.1 ratio — ABNORMAL HIGH (ref 0.8–1.0)
Prothrombin Time: 32.2 s — ABNORMAL HIGH (ref 10.2–12.4)

## 2013-04-17 MED ORDER — POTASSIUM CHLORIDE CRYS ER 20 MEQ PO TBCR: EXTENDED_RELEASE_TABLET | ORAL | Status: DC

## 2013-04-17 MED ORDER — ALPRAZOLAM 0.25 MG PO TABS: 0.2500 mg | ORAL_TABLET | Freq: Every evening | ORAL | Status: DC | PRN

## 2013-04-17 NOTE — Assessment & Plan Note (Signed)
Chronic A. Fib on Coumadin anticoagulation, rate controlled

## 2013-04-17 NOTE — Progress Notes (Signed)
04/17/2013 Joanna Salas   Nov 22, 1924  [id number]  Primary Physician Jeoffrey Massed, MD Primary Cardiologist: Runell Gess MD Roseanne Reno   HPI:  The patient is an 79 year old, mildly overweight, recently widowed (husband of 40 years died a month ago) Caucasian female, mother of 1 living child who I last saw in October. She has a history of hypertension, chronic A-fib rate controlled on Coumadin anticoagulation. She saw Nada Boozer in the office on September 18, 2012, for dizziness thought to be related to inner ear. She otherwise is asymptomatic although she is very upset about the recent loss of her husband. Since I saw her in the office 11/15/12 she is admitted with iron overload/CHF. She was diureses. She does have normal LV systolic function with mild to moderate aortic stenosis, and aortic valve area of 1.2 cm.    Current Outpatient Prescriptions  Medication Sig Dispense Refill  . acetaminophen (TYLENOL) 500 MG tablet Take 1,000 mg by mouth every 6 (six) hours as needed for pain.      Marland Kitchen albuterol (PROVENTIL HFA;VENTOLIN HFA) 108 (90 BASE) MCG/ACT inhaler Inhale 2 puffs into the lungs every 4 (four) hours as needed for wheezing or shortness of breath.      . ALPRAZolam (XANAX) 0.25 MG tablet Take 1 tablet (0.25 mg total) by mouth at bedtime as needed for sleep.  90 tablet  0  . cetirizine (ZYRTEC) 10 MG tablet Take 10 mg by mouth at bedtime.      . citalopram (CELEXA) 10 MG tablet Take 1 tablet (10 mg total) by mouth daily.  30 tablet  5  . furosemide (LASIX) 40 MG tablet Take 1 tablet (40 mg total) by mouth daily.  90 tablet  2  . metoprolol (LOPRESSOR) 50 MG tablet Take 75 mg by mouth 2 (two) times daily.      . metroNIDAZOLE (FLAGYL) 500 MG tablet Take 1 tablet (500 mg total) by mouth every 8 (eight) hours.  30 tablet  0  . ondansetron (ZOFRAN) 4 MG tablet Take 4 mg by mouth every 6 (six) hours as needed for nausea.      . pantoprazole (PROTONIX) 40 MG tablet Take 40  mg by mouth daily as needed (heartburn/indigestion).      . potassium chloride SA (K-DUR,KLOR-CON) 20 MEQ tablet 2 tabs po qd  180 tablet  1  . promethazine (PHENERGAN) 12.5 MG tablet Take 12.5-25 mg by mouth at bedtime as needed for nausea (dizziness).      . ranitidine (ZANTAC) 150 MG tablet Take 150 mg by mouth daily as needed for heartburn (indigestion).      . sennosides-docusate sodium (SENOKOT-S) 8.6-50 MG tablet Take 1 tablet by mouth at bedtime as needed for constipation.      . verapamil (CALAN-SR) 240 MG CR tablet Take 240 mg by mouth daily.      Marland Kitchen warfarin (COUMADIN) 2.5 MG tablet Take 1.25-2.5 mg by mouth daily. 0.5 tab on Sun, Tues, and Thurs; 1 tab all other days.       No current facility-administered medications for this visit.    Allergies  Allergen Reactions  . Pneumococcal Vaccines   . Atorvastatin Other (See Comments)    Severe Leg Cramps  . Ciprofloxacin Hcl Nausea And Vomiting  . Morphine Sulfate Nausea Only  . Penicillins Nausea And Vomiting  . Prednisone Other (See Comments)    Weight gain and md said shouldn't take with afib meds  . Sotalol Hcl Nausea And Vomiting  .  Sulfonamide Derivatives Nausea Only    History   Social History  . Marital Status: Widowed    Spouse Name: N/A    Number of Children: 2  . Years of Education: N/A   Occupational History  . retired     Education officer, museum  .      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 Smoking status: Never Smoker   . Smokeless tobacco: Never Used  . Alcohol Use: No  . Drug Use: No  . Sexual Activity: Not on file   Other Topics Concern  . Not on file   Social History Narrative   Lives in [place].  Daughter, Joanna Salas, lives next door.   Lost a son (MVA he was 35 y/o), recently widowed 10/2012.   Occupation: 26 yrs for [employer]--retired.   No tob, alc, drugs.     Review of Systems: General: negative for chills, fever, night sweats or weight changes.  Cardiovascular: negative for chest  pain, dyspnea on exertion, edema, orthopnea, palpitations, paroxysmal nocturnal dyspnea or shortness of breath Dermatological: negative for rash Respiratory: negative for cough or wheezing Urologic: negative for hematuria Abdominal: negative for nausea, vomiting, diarrhea, bright red blood per rectum, melena, or hematemesis Neurologic: negative for visual changes, syncope, or dizziness All other systems reviewed and are otherwise negative except as noted above.    Blood pressure 102/72, pulse 68, height 5\' 6"  (1.676 m), weight 150 lb (68.04 kg).  General appearance: alert and no distress Neck: no adenopathy, no carotid bruit, no JVD, supple, symmetrical, trachea midline and thyroid not enlarged, symmetric, no tenderness/mass/nodules Lungs: clear to auscultation bilaterally Heart: irregularly irregular rhythm Extremities: extremities normal, atraumatic, no cyanosis or edema  EKG not performed today  ASSESSMENT AND PLAN:   Atrial fibrillation, permanent Chronic A. Fib on Coumadin anticoagulation, rate controlled  Valvular heart disease- mild to moderate AS and MR June 2014 Patient has been admitted with congestive heart failure, bibasilar. Her EF is 55-60% with mild to moderate aortic stenosis, valve area of 1.21 Center squared with mild AI. She was recently admitted on 03/23/13 and underwent IV diuresis for several days.      Runell Gess MD FACP,FACC,FAHA, [facility] 04/17/2013 3:03 PM

## 2013-04-17 NOTE — Assessment & Plan Note (Signed)
Patient has been admitted with congestive heart failure, bibasilar. Her EF is 55-60% with mild to moderate aortic stenosis, valve area of 1.21 Center squared with mild AI. She was recently admitted on 03/23/13 and underwent IV diuresis for several days.

## 2013-04-17 NOTE — Assessment & Plan Note (Signed)
No signs of fluid overload today. BP well controlled and pt on daily dose of lasix 40mg . Check BMET today, continue potassium supplement.

## 2013-04-17 NOTE — Assessment & Plan Note (Signed)
Recent acute renal failure secondary to dehydration--resolved with IVF in hosp. Will f/u BMET today.

## 2013-04-17 NOTE — Assessment & Plan Note (Signed)
Resolving clinically.  Finish flagyl.

## 2013-04-17 NOTE — Assessment & Plan Note (Signed)
Rate controlled with metoprolol and verapamil. Anticoagulated with coumadin. Check PT/INR today due to recent concurrent antibiotic use. Pt has scheduled f/u with her cardiologist, Dr. Allyson Sabal, for this afternoon.

## 2013-04-17 NOTE — Patient Instructions (Addendum)
Your physician wants you to follow-up in:3 months with an extender and  6 months with an extender and 1 year with Dr Allyson Sabal. You will receive a reminder letter in the mail two months in advance. If you don't receive a letter, please call our office to schedule the follow-up appointment.

## 2013-04-17 NOTE — Progress Notes (Signed)
Office Note 04/17/2013  CC:  Chief Complaint  Patient presents with  . Establish Care  . Atrial Fibrillation    HPI:  Joanna Salas is a 79 y.o. White female who is here to establish care, hospital f/u. Patient's most recent primary MD: Dr. Aleatha Borer at [facility] on [address] [address] [facility] [address] in [place]. Old records in Epic/HL EMR were reviewed prior to or during today's visit.  Pt recently in hosp 04/07/13 - 04/10/13 for diarrhea and dehydration, dx'd with C diff colitis and rx'd metronidazole and improved, ARF resolved with IVF.  Needs close f/u of CBC, BMET, and INR (a-fib). Her BMs have improved: has one BM per day and it is becoming more formed.  No blood or pus in stool. However, she thinks she saw blood in her urine this morning--early in the urine stream and then it cleared up as her bladder emptied.  Denies any urinary hesitancy or difficulty urinating.  No dysuria.  No vag d/c or bleeding.  ROS: appetite is fine, no abdominal pains, no bone or joint pain, no pelvic pain.  No SOB, no cough, no chest pains.  +Urinating frequently every since getting on diuretic.     Past Medical History  Diagnosis Date  . Personal history of colonic polyps   . Allergic rhinitis, cause unspecified   . Unspecified asthma(493.90)   . Personal history of other diseases of digestive system   . Personal history of urinary calculi   . Osteoporosis, unspecified   . DJD (degenerative joint disease)   . HTN (hypertension)   . Hyperlipidemia   . Anxiety state, unspecified   . Mitral valve regurgitation, mod. on TEE 2004, & mild to mod TR, EF 60%. 01/20/2013  . Pelvic fracture, recent history of 01/20/2013  . Atrial fibrillation, permanent     Hx. of failed DCCV  . CHF (congestive heart failure)     Diastolic    Past Surgical History  Procedure Laterality Date  . Appendectomy    . Tonsillectomy    . Hemicolectomy      right    Family History  Problem Relation Age of Onset  . Heart disease Mother   .  Heart disease Father     History   Social History  . Marital Status: Widowed    Spouse Name: N/A    Number of Children: 2  . Years of Education: N/A   Occupational History  . retired     Education officer, museum  .      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 Smoking status: Never Smoker   . Smokeless tobacco: Never Used  . Alcohol Use: No  . Drug Use: No  . Sexual Activity: Not on file   Other Topics Concern  . Not on file   Social History Narrative   Lives in [place].  Daughter, [name], lives next door.   Lost a son (MVA he was 68 y/o), recently widowed 10/2012.   Occupation: 26 yrs for [employer]--retired.   No tob, alc, drugs.    Outpatient Encounter Prescriptions as of 04/17/2013  Medication Sig Dispense Refill  . acetaminophen (TYLENOL) 500 MG tablet Take 1,000 mg by mouth every 6 (six) hours as needed for pain.      Marland Kitchen albuterol (PROVENTIL HFA;VENTOLIN HFA) 108 (90 BASE) MCG/ACT inhaler Inhale 2 puffs into the lungs every 4 (four) hours as needed for wheezing or shortness of breath.      . ALPRAZolam (XANAX) 0.25 MG  tablet Take 1 tablet (0.25 mg total) by mouth at bedtime as needed for sleep.  90 tablet  0  . cetirizine (ZYRTEC) 10 MG tablet Take 10 mg by mouth at bedtime.      . citalopram (CELEXA) 10 MG tablet Take 1 tablet (10 mg total) by mouth daily.  30 tablet  5  . furosemide (LASIX) 40 MG tablet Take 1 tablet (40 mg total) by mouth daily.  90 tablet  2  . metoprolol (LOPRESSOR) 50 MG tablet Take 75 mg by mouth 2 (two) times daily.      . metroNIDAZOLE (FLAGYL) 500 MG tablet Take 1 tablet (500 mg total) by mouth every 8 (eight) hours.  30 tablet  0  . ondansetron (ZOFRAN) 4 MG tablet Take 4 mg by mouth every 6 (six) hours as needed for nausea.      . pantoprazole (PROTONIX) 40 MG tablet Take 40 mg by mouth daily as needed (heartburn/indigestion).      . potassium chloride SA (K-DUR,KLOR-CON) 20 MEQ tablet 2 tabs po qd  180 tablet  1  . ranitidine (ZANTAC) 150  MG tablet Take 150 mg by mouth daily as needed for heartburn (indigestion).      . sennosides-docusate sodium (SENOKOT-S) 8.6-50 MG tablet Take 1 tablet by mouth at bedtime as needed for constipation.      . verapamil (CALAN-SR) 240 MG CR tablet Take 240 mg by mouth daily.      Marland Kitchen warfarin (COUMADIN) 2.5 MG tablet Take 1.25-2.5 mg by mouth daily. 0.5 tab on Sun, Tues, and Thurs; 1 tab all other days.      . [DISCONTINUED] ALPRAZolam (XANAX) 0.25 MG tablet Take 0.25 mg by mouth at bedtime as needed for sleep.      . [DISCONTINUED] potassium chloride SA (K-DUR,KLOR-CON) 20 MEQ tablet Take 20 mEq by mouth 2 (two) times daily.      . [DISCONTINUED] tamsulosin (FLOMAX) 0.4 MG CAPS capsule Take 1 capsule (0.4 mg total) by mouth daily.  10 capsule  0  . [DISCONTINUED] trimethoprim (TRIMPEX) 100 MG tablet       . promethazine (PHENERGAN) 12.5 MG tablet Take 12.5-25 mg by mouth at bedtime as needed for nausea (dizziness).       No facility-administered encounter medications on file as of 04/17/2013.    Allergies  Allergen Reactions  . Pneumococcal Vaccines   . Atorvastatin Other (See Comments)    Severe Leg Cramps  . Ciprofloxacin Hcl Nausea And Vomiting  . Morphine Sulfate Nausea Only  . Penicillins Nausea And Vomiting  . Prednisone Other (See Comments)    Weight gain and md said shouldn't take with afib meds  . Sotalol Hcl Nausea And Vomiting  . Sulfonamide Derivatives Nausea Only    ROS See HPI PE; Blood pressure 112/67, pulse 79, temperature 98.6 F (37 C), temperature source Temporal, resp. rate 18, height 5' 4.5" (1.638 m), weight 150 lb (68.04 kg), SpO2 95.00%. Gen: Alert, well appearing.  Patient is oriented to person, place, time, and situation. ENT: Eyes: no injection, icteris, swelling, or exudate.  EOMI, PERRLA. Nose: no drainage or turbinate edema/swelling.  No injection or focal lesion.  Mouth: lips without lesion/swelling.  Oral mucosa pink and moist. Upper and lower dentures in  place.  Oropharynx without erythema, exudate, or swelling.  Neck - No masses or thyromegaly or limitation in range of motion CV: Irreg Irreg rhythm, no murmur/rub/gallop Chest is clear, no wheezing or rales. Normal symmetric air entry throughout both lung  fields. No chest wall deformities or tenderness. EXT: 2+ pitting edema in both lower legs.  Both forearms and both lower legs have scattered ecchymoses.  Pertinent labs:  Lab Results  Component Value Date   INR 3.4 04/17/2013   INR 3.1* 04/17/2013   INR 2.37* 04/10/2013     Chemistry      Component Value Date/Time   NA 138 04/17/2013 1031   K 3.8 04/17/2013 1031   CL 101 04/17/2013 1031   CO2 33* 04/17/2013 1031   BUN 14 04/17/2013 1031   CREATININE 1.1 04/17/2013 1031      Component Value Date/Time   CALCIUM 9.1 04/17/2013 1031   ALKPHOS 92 04/07/2013 2141   AST 32 04/07/2013 2141   ALT 19 04/07/2013 2141   BILITOT 0.5 04/07/2013 2141     Lab Results  Component Value Date   WBC 6.7 04/17/2013   HGB 11.7* 04/17/2013   HCT 37.6 04/17/2013   MCV 85.7 04/17/2013   PLT 192.0 04/17/2013     ASSESSMENT AND PLAN:   New pt; obtain old records.  C. difficile colitis Resolving clinically.  Finish flagyl.  CKD (chronic kidney disease) stage 3, GFR 30-59 ml/min Recent acute renal failure secondary to dehydration--resolved with IVF in hosp. Will f/u BMET today.  Atrial fibrillation, permanent Rate controlled with metoprolol and verapamil. Anticoagulated with coumadin. Check PT/INR today due to recent concurrent antibiotic use. Pt has scheduled f/u with her cardiologist, Dr. Allyson Sabal, for this afternoon.  Chronic diastolic congestive heart failure No signs of fluid overload today. BP well controlled and pt on daily dose of lasix 40mg . Check BMET today, continue potassium supplement.  Gross hematuria Per pt report today, but she seemed less than 100% sure of what she saw. Will check UA with reflex micro today.   An After Visit Summary was printed  and given to the patient.  Return in about 1 month (around 05/17/2013) for f/u a-fib, recent c diff, hx of diast CHF.

## 2013-04-17 NOTE — Assessment & Plan Note (Signed)
Per pt report today, but she seemed less than 100% sure of what she saw. Will check UA with reflex [facility] today.

## 2013-04-21 ENCOUNTER — Telehealth: Payer: Self-pay | Admitting: Cardiovascular Disease

## 2013-04-21 NOTE — Telephone Encounter (Signed)
Would like yout o fax over the schedule of how much coumadin her mom takes and the times fax number is [phone] .Marland Kitchen Please call if you have any questions .Marland Kitchen    Thanks

## 2013-04-21 NOTE — Telephone Encounter (Signed)
Copy of last dose adjustment faxed to dtr.

## 2013-04-23 ENCOUNTER — Telehealth: Payer: Self-pay | Admitting: Cardiovascular Disease

## 2013-04-23 NOTE — Telephone Encounter (Signed)
Joanna Salas called regarding orders for home health. Sammuel Cooper is seeing her for physical therapy from another physician and noticed that she was under the care of Dr. Allyson Sabal. She was wanting to know if Dr. Allyson Sabal would consider her being seen as well for CDP program. Goal to keep congestive heart patients at home and prevent readmission to the hospital. She wanted him to cosign the orders for this if he was ok with her and this program.

## 2013-04-23 NOTE — Telephone Encounter (Signed)
Message forwarded to Dr. Berry for review and further instructions  

## 2013-04-24 ENCOUNTER — Telehealth: Payer: Self-pay | Admitting: Cardiovascular Disease

## 2013-04-24 NOTE — Telephone Encounter (Signed)
Debbie nurse case manager needs to speak to someone about home health orders

## 2013-04-24 NOTE — Telephone Encounter (Signed)
Returned call to [employer].  Informed message received yesterday and Dr. Allyson Sabal has been notified.  Awaiting response.  Debbie verbalized understanding and stated pt's daughter is pressing them and this has become an urgent matter.  Informed Dr. Allyson Sabal will be notified again and they will receive a call once a response is given.  Verbalized understanding.

## 2013-04-24 NOTE — Telephone Encounter (Signed)
Returned call and Debbie informed per instructions by Md.  Verbalized understanding and stated they will get on it now.

## 2013-04-24 NOTE — Telephone Encounter (Signed)
(  Copied from phone note 9.11.14)  Returned call to [employer]. Informed message received yesterday and Dr. Allyson Sabal has been notified. Awaiting response. Debbie verbalized understanding and stated pt's daughter is pressing them and this has become an urgent matter. Informed Dr. Allyson Sabal will be notified again and they will receive a call once a response is given. Verbalized understanding.

## 2013-04-24 NOTE — Telephone Encounter (Signed)
Okay to do CDP program at home

## 2013-05-01 ENCOUNTER — Ambulatory Visit (INDEPENDENT_AMBULATORY_CARE_PROVIDER_SITE_OTHER): Payer: Medicare Other | Admitting: Pharmacist Clinician (PhC)/ Clinical Pharmacy Specialist

## 2013-05-01 VITALS — BP 120/62 | HR 68

## 2013-05-01 DIAGNOSIS — I4891 Unspecified atrial fibrillation: Secondary | ICD-10-CM

## 2013-05-01 DIAGNOSIS — Z7901 Long term (current) use of anticoagulants: Secondary | ICD-10-CM

## 2013-05-01 DIAGNOSIS — I4821 Permanent atrial fibrillation: Secondary | ICD-10-CM

## 2013-05-07 ENCOUNTER — Telehealth: Payer: Self-pay | Admitting: Cardiovascular Disease

## 2013-05-07 NOTE — Telephone Encounter (Signed)
I spoke with the home health nurse.  She notified me that Joanna Salas took her medications and then threw up a few minutes afterward.  It was not immediately after taking the meds.  She asked if Joanna Salas should retake her medications.  I advised against that.  I told her to resume her normal daily medication routine.  She also asked about Joanna Salas's weight. Her weight dipped down below baseline a few days after discharge.  Joanna Salas admitted to eating poorly and small quantity.  Her weight is now returning to baseline.  Her lungs are clear, no edema, no complaints.  I advised to continue to monitor and call with any symptoms.

## 2013-05-12 ENCOUNTER — Ambulatory Visit (INDEPENDENT_AMBULATORY_CARE_PROVIDER_SITE_OTHER): Payer: Medicare Other | Admitting: Family Medicine

## 2013-05-12 ENCOUNTER — Encounter: Payer: Self-pay | Admitting: Family Medicine

## 2013-05-12 ENCOUNTER — Emergency Department (HOSPITAL_COMMUNITY): Payer: Medicare Other

## 2013-05-12 ENCOUNTER — Inpatient Hospital Stay (HOSPITAL_COMMUNITY)
Admission: EM | Admit: 2013-05-12 | Discharge: 2013-05-15 | DRG: 190 | Disposition: A | Discharge status: Home Health | Payer: Medicare Other | Attending: Internal Medicine | Admitting: Internal Medicine

## 2013-05-12 ENCOUNTER — Ambulatory Visit: Payer: Medicare Other | Admitting: Cardiology

## 2013-05-12 ENCOUNTER — Encounter (HOSPITAL_COMMUNITY): Payer: Self-pay | Admitting: Emergency Medicine

## 2013-05-12 VITALS — BP 123/67 | HR 68 | Temp 98.1°F | Resp 24 | Ht 64.5 in | Wt 148.0 lb

## 2013-05-12 DIAGNOSIS — M810 Age-related osteoporosis without current pathological fracture: Secondary | ICD-10-CM

## 2013-05-12 DIAGNOSIS — M1990 Unspecified osteoarthritis, unspecified site: Secondary | ICD-10-CM

## 2013-05-12 DIAGNOSIS — J441 Chronic obstructive pulmonary disease with (acute) exacerbation: Principal | ICD-10-CM | POA: Diagnosis present

## 2013-05-12 DIAGNOSIS — N390 Urinary tract infection, site not specified: Secondary | ICD-10-CM

## 2013-05-12 DIAGNOSIS — Z860100 Personal history of colon polyps, unspecified: Secondary | ICD-10-CM

## 2013-05-12 DIAGNOSIS — I4821 Permanent atrial fibrillation: Secondary | ICD-10-CM

## 2013-05-12 DIAGNOSIS — R310 Gross hematuria: Secondary | ICD-10-CM

## 2013-05-12 DIAGNOSIS — R062 Wheezing: Secondary | ICD-10-CM

## 2013-05-12 DIAGNOSIS — R0603 Acute respiratory distress: Secondary | ICD-10-CM

## 2013-05-12 DIAGNOSIS — I4891 Unspecified atrial fibrillation: Secondary | ICD-10-CM

## 2013-05-12 DIAGNOSIS — I10 Essential (primary) hypertension: Secondary | ICD-10-CM

## 2013-05-12 DIAGNOSIS — Z8719 Personal history of other diseases of the digestive system: Secondary | ICD-10-CM

## 2013-05-12 DIAGNOSIS — R34 Anuria and oliguria: Secondary | ICD-10-CM

## 2013-05-12 DIAGNOSIS — B9620 Unspecified Escherichia coli [E. coli] as the cause of diseases classified elsewhere: Secondary | ICD-10-CM

## 2013-05-12 DIAGNOSIS — R0600 Dyspnea, unspecified: Secondary | ICD-10-CM

## 2013-05-12 DIAGNOSIS — F411 Generalized anxiety disorder: Secondary | ICD-10-CM

## 2013-05-12 DIAGNOSIS — K5730 Diverticulosis of large intestine without perforation or abscess without bleeding: Secondary | ICD-10-CM

## 2013-05-12 DIAGNOSIS — J984 Other disorders of lung: Secondary | ICD-10-CM

## 2013-05-12 DIAGNOSIS — I130 Hypertensive heart and chronic kidney disease with heart failure and stage 1 through stage 4 chronic kidney disease, or unspecified chronic kidney disease: Secondary | ICD-10-CM

## 2013-05-12 DIAGNOSIS — F32A Depression, unspecified: Secondary | ICD-10-CM

## 2013-05-12 DIAGNOSIS — J449 Chronic obstructive pulmonary disease, unspecified: Secondary | ICD-10-CM | POA: Diagnosis present

## 2013-05-12 DIAGNOSIS — I509 Heart failure, unspecified: Secondary | ICD-10-CM | POA: Diagnosis present

## 2013-05-12 DIAGNOSIS — I38 Endocarditis, valve unspecified: Secondary | ICD-10-CM

## 2013-05-12 DIAGNOSIS — I5031 Acute diastolic (congestive) heart failure: Secondary | ICD-10-CM

## 2013-05-12 DIAGNOSIS — I5032 Chronic diastolic (congestive) heart failure: Secondary | ICD-10-CM

## 2013-05-12 DIAGNOSIS — N183 Chronic kidney disease, stage 3 unspecified: Secondary | ICD-10-CM

## 2013-05-12 DIAGNOSIS — I132 Hypertensive heart and chronic kidney disease with heart failure and with stage 5 chronic kidney disease, or end stage renal disease: Secondary | ICD-10-CM

## 2013-05-12 DIAGNOSIS — J811 Chronic pulmonary edema: Secondary | ICD-10-CM

## 2013-05-12 DIAGNOSIS — I059 Rheumatic mitral valve disease, unspecified: Secondary | ICD-10-CM | POA: Diagnosis present

## 2013-05-12 DIAGNOSIS — N179 Acute kidney failure, unspecified: Secondary | ICD-10-CM

## 2013-05-12 DIAGNOSIS — J45909 Unspecified asthma, uncomplicated: Secondary | ICD-10-CM

## 2013-05-12 DIAGNOSIS — R531 Weakness: Secondary | ICD-10-CM

## 2013-05-12 DIAGNOSIS — D689 Coagulation defect, unspecified: Secondary | ICD-10-CM

## 2013-05-12 DIAGNOSIS — A0472 Enterocolitis due to Clostridium difficile, not specified as recurrent: Secondary | ICD-10-CM

## 2013-05-12 DIAGNOSIS — R0609 Other forms of dyspnea: Secondary | ICD-10-CM

## 2013-05-12 DIAGNOSIS — R110 Nausea: Secondary | ICD-10-CM

## 2013-05-12 DIAGNOSIS — I5033 Acute on chronic diastolic (congestive) heart failure: Secondary | ICD-10-CM | POA: Diagnosis present

## 2013-05-12 DIAGNOSIS — F329 Major depressive disorder, single episode, unspecified: Secondary | ICD-10-CM

## 2013-05-12 DIAGNOSIS — Z7901 Long term (current) use of anticoagulants: Secondary | ICD-10-CM

## 2013-05-12 DIAGNOSIS — J9600 Acute respiratory failure, unspecified whether with hypoxia or hypercapnia: Secondary | ICD-10-CM | POA: Diagnosis present

## 2013-05-12 DIAGNOSIS — N281 Cyst of kidney, acquired: Secondary | ICD-10-CM

## 2013-05-12 DIAGNOSIS — E785 Hyperlipidemia, unspecified: Secondary | ICD-10-CM

## 2013-05-12 DIAGNOSIS — Z87442 Personal history of urinary calculi: Secondary | ICD-10-CM

## 2013-05-12 DIAGNOSIS — J302 Other seasonal allergic rhinitis: Secondary | ICD-10-CM

## 2013-05-12 DIAGNOSIS — S32599A Other specified fracture of unspecified pubis, initial encounter for closed fracture: Secondary | ICD-10-CM

## 2013-05-12 DIAGNOSIS — R600 Localized edema: Secondary | ICD-10-CM

## 2013-05-12 DIAGNOSIS — J9601 Acute respiratory failure with hypoxia: Secondary | ICD-10-CM

## 2013-05-12 HISTORY — DX: Calculus of kidney: N20.0

## 2013-05-12 HISTORY — DX: Polycystic kidney, unspecified: Q61.3

## 2013-05-12 HISTORY — DX: Calculus of gallbladder without cholecystitis without obstruction: K80.20

## 2013-05-12 HISTORY — DX: Personal history of (healed) traumatic fracture: Z87.81

## 2013-05-12 LAB — POCT I-STAT TROPONIN I: Troponin i, poc: 0.01 ng/mL (ref 0.00–0.08)

## 2013-05-12 LAB — POCT I-STAT 3, ART BLOOD GAS (G3+)
Acid-Base Excess: 8 mmol/L — ABNORMAL HIGH (ref 0.0–2.0)
O2 Saturation: 100 %
Patient temperature: 98.1
TCO2: 37 mmol/L (ref 0–100)
pO2, Arterial: 190 mmHg — ABNORMAL HIGH (ref 80.0–100.0)

## 2013-05-12 LAB — COMPREHENSIVE METABOLIC PANEL
ALT: 10 U/L (ref 0–35)
AST: 23 U/L (ref 0–37)
CO2: 33 mEq/L — ABNORMAL HIGH (ref 19–32)
Calcium: 9.1 mg/dL (ref 8.4–10.5)
Potassium: 4.2 mEq/L (ref 3.5–5.1)
Sodium: 142 mEq/L (ref 135–145)
Total Protein: 6.3 g/dL (ref 6.0–8.3)

## 2013-05-12 LAB — CBC WITH DIFFERENTIAL/PLATELET
Basophils Absolute: 0 10*3/uL (ref 0.0–0.1)
Eosinophils Absolute: 0.1 10*3/uL (ref 0.0–0.7)
Eosinophils Relative: 1 % (ref 0–5)
Lymphocytes Relative: 22 % (ref 12–46)
MCV: 83.7 fL (ref 78.0–100.0)
Neutrophils Relative %: 69 % (ref 43–77)
Platelets: 176 10*3/uL (ref 150–400)
RDW: 16.2 % — ABNORMAL HIGH (ref 11.5–15.5)
WBC: 5.1 10*3/uL (ref 4.0–10.5)

## 2013-05-12 LAB — URINALYSIS, ROUTINE W REFLEX MICROSCOPIC
Glucose, UA: NEGATIVE mg/dL
Hgb urine dipstick: NEGATIVE
Specific Gravity, Urine: 1.008 (ref 1.005–1.030)

## 2013-05-12 LAB — URINE MICROSCOPIC-ADD ON

## 2013-05-12 MED ORDER — CEFTRIAXONE SODIUM 1 G IJ SOLR
1.0000 g | Freq: Once | INTRAMUSCULAR | Status: AC
  Administered 2013-05-12: 1 g via INTRAMUSCULAR
  Filled 2013-05-12: qty 10

## 2013-05-12 MED ORDER — ALBUTEROL SULFATE (5 MG/ML) 0.5% IN NEBU
5.0000 mg | INHALATION_SOLUTION | Freq: Once | RESPIRATORY_TRACT | Status: AC
  Administered 2013-05-12: 5 mg via RESPIRATORY_TRACT
  Filled 2013-05-12: qty 1

## 2013-05-12 MED ORDER — LIDOCAINE HCL (PF) 1 % IJ SOLN
INTRAMUSCULAR | Status: AC
  Administered 2013-05-12: 2.1 mL
  Filled 2013-05-12: qty 5

## 2013-05-12 MED ORDER — FUROSEMIDE 10 MG/ML IJ SOLN
40.0000 mg | Freq: Once | INTRAMUSCULAR | Status: AC
  Administered 2013-05-12: 40 mg via INTRAVENOUS
  Filled 2013-05-12: qty 4

## 2013-05-12 MED ORDER — IPRATROPIUM BROMIDE 0.02 % IN SOLN
0.5000 mg | Freq: Once | RESPIRATORY_TRACT | Status: AC
  Administered 2013-05-12: 0.5 mg via RESPIRATORY_TRACT
  Filled 2013-05-12: qty 2.5

## 2013-05-12 MED ORDER — METHYLPREDNISOLONE SODIUM SUCC 125 MG IJ SOLR: 125.0000 mg | INTRAMUSCULAR | Status: DC

## 2013-05-12 NOTE — Assessment & Plan Note (Addendum)
Suspect pulm edema from acute-on-chronic diastolic CHF. Consider PE if CXR unremarkable, though. Oxygen status here is borderline--93% RA.  Resp rate about 50/min. She is reluctant to go to the ED but I feel like she needs further evaluation and management there, esp given her comorbid medical conditions and her age. Pt and daughter prefer transport by ED today and I think this is appropriate. EMS came today and transported her to [facility] ED.  She was in stable condition when leaving the office today.

## 2013-05-12 NOTE — ED Notes (Signed)
Called RT to come assess pt.

## 2013-05-12 NOTE — ED Provider Notes (Signed)
CSN: [id number]     Arrival date & time 05/12/13  1728 History   First MD Initiated Contact with Patient 05/12/13 1730     No chief complaint on file.  (Consider location/radiation/quality/duration/timing/severity/associated sxs/prior Treatment) The history is provided by the patient and medical records. No language interpreter was used.    Joanna Salas is an 79 year old female who presents emergency Department with 3 days of worsening dyspnea.  Patient states it has been progressive.  She's been using her albuterol MDI without resolution.  Patient has a history of Afib, asthma and CHF.  Patient denies fevers, chills, nausea, vomiting.  She's had a dry cough.  Patient has been taking her Lasix as well.  She denies any peripheral edema or JVD.  Patient has been on long-term warfarin for her ears atrial fibrillation.  She denies any bloody stools or melena. Denies fevers, chills, myalgias, arthralgias.  Denies dysuria, flank pain, suprapubic pain, frequency, urgency, or hematuria. Denies headaches, light headedness, weakness, visual disturbances. Denies abdominal pain, nausea, vomiting, diarrhea or constipation.    Past Medical History  Diagnosis Date  . Personal history of colonic polyps   . Allergic rhinitis, cause unspecified   . Unspecified asthma(493.90)   . Personal history of other diseases of digestive system   . Personal history of urinary calculi   . Osteoporosis, unspecified   . DJD (degenerative joint disease)   . HTN (hypertension)   . Hyperlipidemia   . Anxiety state, unspecified   . Mitral valve regurgitation, mod. on TEE 2004, & mild to mod TR, EF 60%. 01/20/2013  . Pelvic fracture, recent history of 01/20/2013    s/p fall from a ladder  . Atrial fibrillation, permanent     Hx. of failed DCCV  . CHF (congestive heart failure)     Diastolic  . Moderate aortic stenosis 01/2013   Past Surgical History  Procedure Laterality Date  . Appendectomy    . Tonsillectomy    .  Hemicolectomy      right   Family History  Problem Relation Age of Onset  . Heart disease Mother   . Heart disease Father    History  Substance Use Topics  . Smoking status: Never Smoker   . Smokeless tobacco: Never Used  . Alcohol Use: No   OB History   Grav Para Term Preterm Abortions TAB SAB Ect Mult Living                 Review of Systems Ten systems reviewed and are negative for acute change, except as noted in the HPI.   Allergies  Pneumococcal vaccines; Atorvastatin; Ciprofloxacin hcl; Morphine sulfate; Penicillins; Prednisone; Sotalol hcl; and Sulfonamide derivatives  Home Medications   Current Outpatient Rx  Name  Route  Sig  Dispense  Refill  . acetaminophen (TYLENOL) 500 MG tablet   Oral   Take 1,000 mg by mouth every 6 (six) hours as needed for pain.         Marland Kitchen albuterol (PROVENTIL HFA;VENTOLIN HFA) 108 (90 BASE) MCG/ACT inhaler   Inhalation   Inhale 2 puffs into the lungs every 4 (four) hours as needed for wheezing or shortness of breath.         . ALPRAZolam (XANAX) 0.25 MG tablet   Oral   Take 1 tablet (0.25 mg total) by mouth at bedtime as needed for sleep.   90 tablet   0   . cetirizine (ZYRTEC) 10 MG tablet   Oral   Take  10 mg by mouth at bedtime.         . citalopram (CELEXA) 10 MG tablet   Oral   Take 1 tablet (10 mg total) by mouth daily.   30 tablet   5   . furosemide (LASIX) 40 MG tablet   Oral   Take 1 tablet (40 mg total) by mouth daily.   90 tablet   2   . metoprolol (LOPRESSOR) 50 MG tablet   Oral   Take 75 mg by mouth 2 (two) times daily.         . metroNIDAZOLE (FLAGYL) 500 MG tablet   Oral   Take 1 tablet (500 mg total) by mouth every 8 (eight) hours.   30 tablet   0   . ondansetron (ZOFRAN) 4 MG tablet   Oral   Take 4 mg by mouth every 6 (six) hours as needed for nausea.         . pantoprazole (PROTONIX) 40 MG tablet   Oral   Take 40 mg by mouth daily as needed (heartburn/indigestion).         .  potassium chloride SA (K-DUR,KLOR-CON) 20 MEQ tablet      2 tabs po qd   180 tablet   1   . promethazine (PHENERGAN) 12.5 MG tablet   Oral   Take 12.5-25 mg by mouth at bedtime as needed for nausea (dizziness).         . ranitidine (ZANTAC) 150 MG tablet   Oral   Take 150 mg by mouth daily as needed for heartburn (indigestion).         . sennosides-docusate sodium (SENOKOT-S) 8.6-50 MG tablet   Oral   Take 1 tablet by mouth at bedtime as needed for constipation.         . verapamil (CALAN-SR) 240 MG CR tablet   Oral   Take 240 mg by mouth daily.         Marland Kitchen warfarin (COUMADIN) 2.5 MG tablet   Oral   Take 1.25-2.5 mg by mouth daily. 0.5 tab on Sun, Tues, and Thurs; 1 tab all other days.          Pulse 117  Resp 24  SpO2 100% Physical Exam  Constitutional: She is oriented to person, place, and time. She appears well-developed and well-nourished. No distress.  HENT:  Head: Normocephalic and atraumatic.  Eyes: Conjunctivae are normal. No scleral icterus.  Neck: Normal range of motion.  Cardiovascular: Normal rate, regular rhythm and normal heart sounds.  Exam reveals no gallop and no friction rub.   No murmur heard. Pulmonary/Chest: Effort normal. She has wheezes.  Patient is tachypneic, using accessory muscles with sternal and supraclavicular retractions.  Expiratory phase is prolonged.  Patient's air movement is very minimized throughout.  She has a tight wheeze at the end of her expiratory phase.  Abdominal: Soft. Bowel sounds are normal. She exhibits no distension and no mass. There is no tenderness. There is no guarding.  Neurological: She is alert and oriented to person, place, and time.  Skin: Skin is warm and dry. She is not diaphoretic.    ED Course  Procedures (including critical care time) Labs Review Labs Reviewed - No data to display Imaging Review No results found.  Date: 05/13/2013  Rate: 98  Rhythm: atrial fibrillation  QRS Axis: normal   Intervals: QT prolonged  ST/T Wave abnormalities: nonspecific ST/T changes  Conduction Disutrbances:none  Narrative Interpretation:   Old EKG Reviewed: unchanged  MDM  No diagnosis found. 8:04 PM Filed Vitals:   05/12/13 2002  BP: 118/62  Pulse: 87  Resp: 23   Patient test x-ray shows interstitial edema consistent with CHF.  She also has tightness and wheezing.  Patient has a mixed CHF asthma picture.  She has received one 5 mg albuterol and 0.5 mg ipratropium nebulizer treatment.  She is receiving a second dose of this at this time.  Initiated milligram IV Lasix.  Patient does not wish to be admitted and I will try to wean her from her 2 L oxygen via nasal cannula  Able to ambulate without her saturations dropping as long as her breathing problem is resolved.   Patient 02 sats dropped into the 90's during ambulation. They fell even lower into the 80's while restig and speaking with me. Patient ids followed By Dr. Fannie Knee whohas told the patient f=previously that she needs to be on home 02. The patient likely has a very low 02 sat at baseline, however I do not have record of this. i feel she meets overnight observation with breathing treatments. Follow up with Clint Young and establishemnt of 02 for home.  dr. Julian Reil will admit the patien.      Arthor Captain, PA-C 05/13/13 1614

## 2013-05-12 NOTE — ED Notes (Signed)
Per EMS - pt went into physician today c/o sob X 3 days. Pt was using accessory muscles to breath had O2 sats at 93% on room air, physician is concern about CHF and sent to ED for further evaluation. Lives with daughter. Pt had recent fall and broke pelvis. EMS started IV in right forearm. BP 126/70 HR 56-78 in a.fib has hx of a.fib. ems placed pt on 4liters/min oxygen raised up to 100%. ems attempted to take off oxygen and pt drop to 92% on room air. Pt in nad, skin warm and dry, resp e/u.

## 2013-05-12 NOTE — Progress Notes (Signed)
OFFICE NOTE  05/12/2013  CC:  Chief Complaint  Patient presents with  . Nausea    this am     HPI: Patient is a 79 y.o. Caucasian female who is here for SOB and nausea. Onset 2 nights ago with just orthopnea, has progressed to some ? resting SOB even when sitting up--conflicting info from pt and daughter about this point, gets worse with ambulation of just a few steps now.  No chest, jaw, or arm pain.  Denies palpitations or heart racing.   Feels nauseated, vomited some of her BF up this morning.  Reports compliance with meds and diet recently. Some cough and dizziness when laying on her back.  No fevers.  No abd pains, no constipation or diarrhea.  No change in LE swelling from baseline. Albuterol 2 puffs helps only about 1 hour.    Pertinent PMH:  Past Medical History  Diagnosis Date  . Personal history of colonic polyps   . Allergic rhinitis, cause unspecified   . Unspecified asthma(493.90)   . Personal history of other diseases of digestive system   . Personal history of urinary calculi   . Osteoporosis, unspecified   . DJD (degenerative joint disease)   . HTN (hypertension)   . Hyperlipidemia   . Anxiety state, unspecified   . Mitral valve regurgitation, mod. on TEE 2004, & mild to mod TR, EF 60%. 01/20/2013  . Pelvic fracture, recent history of 01/20/2013    s/p fall from a ladder  . Atrial fibrillation, permanent     Hx. of failed DCCV  . CHF (congestive heart failure)     Diastolic  . Moderate aortic stenosis 01/2013    MEDS:  Outpatient Prescriptions Prior to Visit  Medication Sig Dispense Refill  . albuterol (PROVENTIL HFA;VENTOLIN HFA) 108 (90 BASE) MCG/ACT inhaler Inhale 2 puffs into the lungs every 4 (four) hours as needed for wheezing or shortness of breath.      . ALPRAZolam (XANAX) 0.25 MG tablet Take 1 tablet (0.25 mg total) by mouth at bedtime as needed for sleep.  90 tablet  0  . cetirizine (ZYRTEC) 10 MG tablet Take 10 mg by mouth at bedtime.      .  citalopram (CELEXA) 10 MG tablet Take 1 tablet (10 mg total) by mouth daily.  30 tablet  5  . furosemide (LASIX) 40 MG tablet Take 1 tablet (40 mg total) by mouth daily.  90 tablet  2  . metoprolol (LOPRESSOR) 50 MG tablet Take 75 mg by mouth 2 (two) times daily.      . potassium chloride SA (K-DUR,KLOR-CON) 20 MEQ tablet 2 tabs po qd  180 tablet  1  . promethazine (PHENERGAN) 12.5 MG tablet Take 12.5-25 mg by mouth at bedtime as needed for nausea (dizziness).      . ranitidine (ZANTAC) 150 MG tablet Take 150 mg by mouth daily as needed for heartburn (indigestion).      . sennosides-docusate sodium (SENOKOT-S) 8.6-50 MG tablet Take 1 tablet by mouth at bedtime as needed for constipation.      . verapamil (CALAN-SR) 240 MG CR tablet Take 240 mg by mouth daily.      Marland Kitchen warfarin (COUMADIN) 2.5 MG tablet Take 1.25-2.5 mg by mouth daily. 0.5 tab on Sun, Tues, and Thurs; 1 tab all other days.      Marland Kitchen acetaminophen (TYLENOL) 500 MG tablet Take 1,000 mg by mouth every 6 (six) hours as needed for pain.      Marland Kitchen  metroNIDAZOLE (FLAGYL) 500 MG tablet Take 1 tablet (500 mg total) by mouth every 8 (eight) hours.  30 tablet  0  . ondansetron (ZOFRAN) 4 MG tablet Take 4 mg by mouth every 6 (six) hours as needed for nausea.      . pantoprazole (PROTONIX) 40 MG tablet Take 40 mg by mouth daily as needed (heartburn/indigestion).       No facility-administered medications prior to visit.    PE: Blood pressure 123/67, pulse 68, temperature 98.1 F (36.7 C), temperature source Temporal, resp. rate 24, height 5' 4.5" (1.638 m), weight 148 lb (67.132 kg), SpO2 93.00%. Gen: frail, elderly female breathing fast but she is in NAD.  Alert, oriented x 4. ENT:   Eyes: no injection, icteris, swelling, or exudate.  EOMI, PERRLA. Nose: no drainage or turbinate edema/swelling.  No injection or focal lesion.  Mouth: lips without lesion/swelling.  Oral mucosa pink and moist.    Oropharynx without erythema, exudate, or swelling.   Neck: no LAD or mass.  Trachea midline. CV: Irreg irreg, rate 60s, S1 and S2 distant.  No audible murmur, rub, or gallop. LUNGS: RR 50/min, bibasilar insp crackles (soft), diminished BS in both bases, no wheezing and no prolongation of expiratory phase.  No post-exhalation coughing.   ABD: soft, NT/ND EXT: no cyanosis or clubbing.  1+ bilat lower leg pitting edema  12 lead EKG today: a-fib, rate about 60-65, low voltage limb leads.  No ST segment or T wave abnormalities.  Isolated Q wave in aVF.   No change compared to EKG 04/08/13.  IMPRESSION AND PLAN:  Acute respiratory distress Suspect pulm edema from acute-on-chronic diastolic CHF. Consider PE if CXR unremarkable, though. Oxygen status here is borderline--93% RA.  Resp rate about 50/min. She is reluctant to go to the ED but I feel like she needs further evaluation and management there, esp given her comorbid medical conditions and her age. Pt and daughter prefer transport by ED today and I think this is appropriate.   An After Visit Summary was printed and given to the patient.   FOLLOW UP: 5d

## 2013-05-13 ENCOUNTER — Encounter (HOSPITAL_COMMUNITY): Payer: Self-pay

## 2013-05-13 DIAGNOSIS — I509 Heart failure, unspecified: Secondary | ICD-10-CM

## 2013-05-13 DIAGNOSIS — R0609 Other forms of dyspnea: Secondary | ICD-10-CM

## 2013-05-13 DIAGNOSIS — Z7901 Long term (current) use of anticoagulants: Secondary | ICD-10-CM

## 2013-05-13 DIAGNOSIS — I5031 Acute diastolic (congestive) heart failure: Secondary | ICD-10-CM

## 2013-05-13 DIAGNOSIS — A0472 Enterocolitis due to Clostridium difficile, not specified as recurrent: Secondary | ICD-10-CM

## 2013-05-13 DIAGNOSIS — J984 Other disorders of lung: Secondary | ICD-10-CM

## 2013-05-13 DIAGNOSIS — I4891 Unspecified atrial fibrillation: Secondary | ICD-10-CM

## 2013-05-13 DIAGNOSIS — J449 Chronic obstructive pulmonary disease, unspecified: Secondary | ICD-10-CM | POA: Diagnosis present

## 2013-05-13 DIAGNOSIS — Z5181 Encounter for therapeutic drug level monitoring: Secondary | ICD-10-CM

## 2013-05-13 DIAGNOSIS — N390 Urinary tract infection, site not specified: Secondary | ICD-10-CM | POA: Diagnosis present

## 2013-05-13 DIAGNOSIS — I5032 Chronic diastolic (congestive) heart failure: Secondary | ICD-10-CM

## 2013-05-13 DIAGNOSIS — J9600 Acute respiratory failure, unspecified whether with hypoxia or hypercapnia: Secondary | ICD-10-CM

## 2013-05-13 HISTORY — DX: Enterocolitis due to Clostridium difficile, not specified as recurrent: A04.72

## 2013-05-13 LAB — PROTIME-INR: Prothrombin Time: 23 seconds — ABNORMAL HIGH (ref 11.6–15.2)

## 2013-05-13 LAB — CBC
Hemoglobin: 11.2 g/dL — ABNORMAL LOW (ref 12.0–15.0)
MCH: 25.6 pg — ABNORMAL LOW (ref 26.0–34.0)
MCV: 83.8 fL (ref 78.0–100.0)
Platelets: 171 10*3/uL (ref 150–400)
RBC: 4.37 MIL/uL (ref 3.87–5.11)

## 2013-05-13 LAB — BASIC METABOLIC PANEL
CO2: 36 mEq/L — ABNORMAL HIGH (ref 19–32)
Calcium: 9.3 mg/dL (ref 8.4–10.5)
Creatinine, Ser: 1.06 mg/dL (ref 0.50–1.10)
Glucose, Bld: 104 mg/dL — ABNORMAL HIGH (ref 70–99)

## 2013-05-13 MED ORDER — IPRATROPIUM BROMIDE 0.02 % IN SOLN
0.5000 mg | Freq: Once | RESPIRATORY_TRACT | Status: AC
  Administered 2013-05-13: 0.5 mg via RESPIRATORY_TRACT

## 2013-05-13 MED ORDER — FUROSEMIDE 40 MG PO TABS
40.0000 mg | ORAL_TABLET | Freq: Every day | ORAL | Status: DC
  Administered 2013-05-13: 40 mg via ORAL
  Filled 2013-05-13: qty 1

## 2013-05-13 MED ORDER — WARFARIN SODIUM 2.5 MG PO TABS
2.5000 mg | ORAL_TABLET | ORAL | Status: DC
  Administered 2013-05-14: 2.5 mg via ORAL
  Filled 2013-05-13: qty 1

## 2013-05-13 MED ORDER — IPRATROPIUM BROMIDE 0.02 % IN SOLN
0.5000 mg | Freq: Four times a day (QID) | RESPIRATORY_TRACT | Status: DC
  Administered 2013-05-13 – 2013-05-14 (×4): 0.5 mg via RESPIRATORY_TRACT
  Filled 2013-05-13 (×4): qty 2.5

## 2013-05-13 MED ORDER — ALBUTEROL SULFATE (5 MG/ML) 0.5% IN NEBU
2.5000 mg | INHALATION_SOLUTION | RESPIRATORY_TRACT | Status: DC | PRN
  Administered 2013-05-13: 2.5 mg via RESPIRATORY_TRACT
  Filled 2013-05-13: qty 0.5

## 2013-05-13 MED ORDER — IPRATROPIUM BROMIDE 0.02 % IN SOLN: 0.5000 mg | RESPIRATORY_TRACT | Status: DC

## 2013-05-13 MED ORDER — ALBUTEROL SULFATE (5 MG/ML) 0.5% IN NEBU: 2.5000 mg | INHALATION_SOLUTION | RESPIRATORY_TRACT | Status: DC

## 2013-05-13 MED ORDER — WARFARIN 1.25 MG HALF TABLET
1.2500 mg | ORAL_TABLET | ORAL | Status: DC
  Administered 2013-05-13: 1.25 mg via ORAL
  Filled 2013-05-13 (×2): qty 1

## 2013-05-13 MED ORDER — METOPROLOL TARTRATE 50 MG PO TABS
75.0000 mg | ORAL_TABLET | Freq: Two times a day (BID) | ORAL | Status: DC
  Administered 2013-05-13 – 2013-05-15 (×5): 75 mg via ORAL
  Filled 2013-05-13 (×7): qty 1

## 2013-05-13 MED ORDER — ALBUTEROL SULFATE (5 MG/ML) 0.5% IN NEBU
INHALATION_SOLUTION | RESPIRATORY_TRACT | Status: AC
  Filled 2013-05-13: qty 0.5

## 2013-05-13 MED ORDER — ALPRAZOLAM 0.25 MG PO TABS
0.2500 mg | ORAL_TABLET | Freq: Every evening | ORAL | Status: DC | PRN
  Administered 2013-05-13 – 2013-05-14 (×2): 0.25 mg via ORAL
  Filled 2013-05-13 (×2): qty 1

## 2013-05-13 MED ORDER — CITALOPRAM HYDROBROMIDE 10 MG PO TABS
10.0000 mg | ORAL_TABLET | Freq: Every day | ORAL | Status: DC
  Administered 2013-05-13 – 2013-05-15 (×3): 10 mg via ORAL
  Filled 2013-05-13 (×3): qty 1

## 2013-05-13 MED ORDER — FUROSEMIDE 40 MG PO TABS
60.0000 mg | ORAL_TABLET | Freq: Every day | ORAL | Status: DC
  Filled 2013-05-13: qty 1

## 2013-05-13 MED ORDER — LORATADINE 10 MG PO TABS
10.0000 mg | ORAL_TABLET | Freq: Every day | ORAL | Status: DC
  Administered 2013-05-13 – 2013-05-15 (×3): 10 mg via ORAL
  Filled 2013-05-13 (×3): qty 1

## 2013-05-13 MED ORDER — WARFARIN - PHARMACIST DOSING INPATIENT
Freq: Every day | Status: DC
  Administered 2013-05-14: 18:00:00

## 2013-05-13 MED ORDER — VERAPAMIL HCL ER 240 MG PO TBCR
240.0000 mg | EXTENDED_RELEASE_TABLET | Freq: Every day | ORAL | Status: DC
  Administered 2013-05-13 – 2013-05-15 (×3): 240 mg via ORAL
  Filled 2013-05-13 (×3): qty 1

## 2013-05-13 MED ORDER — FUROSEMIDE 20 MG PO TABS
20.0000 mg | ORAL_TABLET | Freq: Once | ORAL | Status: AC
  Administered 2013-05-13: 20 mg via ORAL
  Filled 2013-05-13: qty 1

## 2013-05-13 MED ORDER — PANTOPRAZOLE SODIUM 40 MG PO TBEC: 40.0000 mg | DELAYED_RELEASE_TABLET | Freq: Every day | ORAL | Status: DC | PRN

## 2013-05-13 MED ORDER — ALBUTEROL SULFATE HFA 108 (90 BASE) MCG/ACT IN AERS: 2.0000 | INHALATION_SPRAY | RESPIRATORY_TRACT | Status: DC | PRN

## 2013-05-13 MED ORDER — ALBUTEROL SULFATE (5 MG/ML) 0.5% IN NEBU
2.5000 mg | INHALATION_SOLUTION | Freq: Once | RESPIRATORY_TRACT | Status: AC
  Administered 2013-05-13: 2.5 mg via RESPIRATORY_TRACT

## 2013-05-13 MED ORDER — FUROSEMIDE 40 MG PO TABS
60.0000 mg | ORAL_TABLET | Freq: Every day | ORAL | Status: DC
  Administered 2013-05-14: 10:00:00 60 mg via ORAL
  Filled 2013-05-13 (×2): qty 1

## 2013-05-13 MED ORDER — METRONIDAZOLE 500 MG PO TABS
500.0000 mg | ORAL_TABLET | Freq: Three times a day (TID) | ORAL | Status: DC
  Filled 2013-05-13 (×8): qty 1

## 2013-05-13 MED ORDER — PROMETHAZINE HCL 12.5 MG PO TABS: 12.5000 mg | ORAL_TABLET | Freq: Every evening | ORAL | Status: DC | PRN

## 2013-05-13 MED ORDER — SODIUM CHLORIDE 0.9 % IJ SOLN
3.0000 mL | Freq: Two times a day (BID) | INTRAMUSCULAR | Status: DC
  Administered 2013-05-13 – 2013-05-15 (×6): 3 mL via INTRAVENOUS

## 2013-05-13 MED ORDER — PREDNISONE 20 MG PO TABS
40.0000 mg | ORAL_TABLET | Freq: Every day | ORAL | Status: DC
  Administered 2013-05-14 – 2013-05-15 (×2): 40 mg via ORAL
  Filled 2013-05-13 (×3): qty 2

## 2013-05-13 MED ORDER — ALBUTEROL SULFATE (5 MG/ML) 0.5% IN NEBU
2.5000 mg | INHALATION_SOLUTION | Freq: Four times a day (QID) | RESPIRATORY_TRACT | Status: DC
  Administered 2013-05-13 – 2013-05-14 (×5): 2.5 mg via RESPIRATORY_TRACT
  Filled 2013-05-13 (×4): qty 0.5

## 2013-05-13 MED ORDER — DEXTROSE 5 % IV SOLN
1.0000 g | INTRAVENOUS | Status: DC
  Administered 2013-05-13: 1 g via INTRAVENOUS
  Filled 2013-05-13 (×3): qty 10

## 2013-05-13 MED ORDER — ALBUTEROL SULFATE (5 MG/ML) 0.5% IN NEBU
2.5000 mg | INHALATION_SOLUTION | RESPIRATORY_TRACT | Status: DC
  Filled 2013-05-13: qty 0.5

## 2013-05-13 MED ORDER — ONDANSETRON HCL 4 MG PO TABS: 4.0000 mg | ORAL_TABLET | Freq: Four times a day (QID) | ORAL | Status: DC | PRN

## 2013-05-13 MED ORDER — POTASSIUM CHLORIDE CRYS ER 20 MEQ PO TBCR
40.0000 meq | EXTENDED_RELEASE_TABLET | Freq: Every day | ORAL | Status: DC
  Administered 2013-05-13 – 2013-05-15 (×3): 40 meq via ORAL
  Filled 2013-05-13 (×3): qty 2

## 2013-05-13 MED ORDER — IPRATROPIUM BROMIDE 0.02 % IN SOLN
0.5000 mg | RESPIRATORY_TRACT | Status: DC
  Filled 2013-05-13: qty 2.5

## 2013-05-13 NOTE — H&P (Signed)
[facility] History and Physical  Joanna Salas ZOX:[id number] DOB: 05/11/1925 DOA: 05/12/2013  Referring physician: ED PCP: Jeoffrey Massed, MD  Chief Complaint: SOB  HPI: Joanna Salas is a 79 y.o. female who presents to the ED with wheezing, shortness of breath.  Symptoms onset 3 days ago, progressively worsening.  Albuterol MDI hasnt been helping.  Pt also has h/o A.Fib, CHF.  No F/C, N/V.  Work up in ED c/w a very mild COPD exacerbation, but when they got patient up to ambulate, she desaturated down to 89%.  Thankfully this resolved at rest and she is sating 97% while at rest.  There is some question as to what her baseline ambulatory saturation is on RA as it sounds as though her pulmonologist has brought up the issue of home oxygen with the patient previously (which would therefore suggest that her baseline O2 sat while ambulating on room air is < 88%).  Hospitalist has been asked to admit however as she is not currently on any home O2.  Review of Systems: 12 systems reviewed and otherwise negative.  Past Medical History  Diagnosis Date  . Personal history of colonic polyps   . Allergic rhinitis, cause unspecified   . Unspecified asthma(493.90)   . Personal history of other diseases of digestive system   . Personal history of urinary calculi   . Osteoporosis, unspecified   . DJD (degenerative joint disease)   . HTN (hypertension)   . Hyperlipidemia   . Anxiety state, unspecified   . Mitral valve regurgitation, mod. on TEE 2004, & mild to mod TR, EF 60%. 01/20/2013  . Pelvic fracture, recent history of 01/20/2013    s/p fall from a ladder  . Atrial fibrillation, permanent     Hx. of failed DCCV  . CHF (congestive heart failure)     Diastolic  . Moderate aortic stenosis 01/2013   Past Surgical History  Procedure Laterality Date  . Appendectomy    . Tonsillectomy    . Hemicolectomy      right   Social History:  reports that she has never smoked. She has never  used smokeless tobacco. She reports that she does not drink alcohol or use illicit drugs.   Allergies  Allergen Reactions  . Pneumococcal Vaccines   . Atorvastatin Other (See Comments)    Severe Leg Cramps  . Ciprofloxacin Hcl Nausea And Vomiting  . Morphine Sulfate Nausea Only  . Penicillins Nausea And Vomiting  . Prednisone Other (See Comments)    Weight gain and md said shouldn't take with afib meds  . Sotalol Hcl Nausea And Vomiting  . Sulfonamide Derivatives Nausea Only    Family History  Problem Relation Age of Onset  . Heart disease Mother   . Heart disease Father       Prior to Admission medications   Medication Sig Start Date End Date Taking? Authorizing Provider  acetaminophen (TYLENOL) 500 MG tablet Take 1,000 mg by mouth every 6 (six) hours as needed for pain.    Historical Provider, MD  albuterol (PROVENTIL HFA;VENTOLIN HFA) 108 (90 BASE) MCG/ACT inhaler Inhale 2 puffs into the lungs every 4 (four) hours as needed for wheezing or shortness of breath.    Historical Provider, MD  ALPRAZolam Prudy Feeler) 0.25 MG tablet Take 1 tablet (0.25 mg total) by mouth at bedtime as needed for sleep. 04/17/13   Jeoffrey Massed, MD  cetirizine (ZYRTEC) 10 MG tablet Take 10 mg by mouth at bedtime.  Historical Provider, MD  citalopram (CELEXA) 10 MG tablet Take 1 tablet (10 mg total) by mouth daily. 02/10/13   Abelino Derrick, PA-C  furosemide (LASIX) 40 MG tablet Take 1 tablet (40 mg total) by mouth daily. 03/20/13   Abelino Derrick, PA-C  metoprolol (LOPRESSOR) 50 MG tablet Take 75 mg by mouth 2 (two) times daily.    Historical Provider, MD  metroNIDAZOLE (FLAGYL) 500 MG tablet Take 1 tablet (500 mg total) by mouth every 8 (eight) hours. 04/10/13   Leroy Sea, MD  ondansetron (ZOFRAN) 4 MG tablet Take 4 mg by mouth every 6 (six) hours as needed for nausea.    Historical Provider, MD  pantoprazole (PROTONIX) 40 MG tablet Take 40 mg by mouth daily as needed (heartburn/indigestion).     Historical Provider, MD  potassium chloride SA (K-DUR,KLOR-CON) 20 MEQ tablet 2 tabs po qd 04/17/13   Jeoffrey Massed, MD  promethazine (PHENERGAN) 12.5 MG tablet Take 12.5-25 mg by mouth at bedtime as needed for nausea (dizziness).    Historical Provider, MD  ranitidine (ZANTAC) 150 MG tablet Take 150 mg by mouth daily as needed for heartburn (indigestion).    Historical Provider, MD  sennosides-docusate sodium (SENOKOT-S) 8.6-50 MG tablet Take 1 tablet by mouth at bedtime as needed for constipation.    Historical Provider, MD  verapamil (CALAN-SR) 240 MG CR tablet Take 240 mg by mouth daily.    Historical Provider, MD  warfarin (COUMADIN) 2.5 MG tablet Take 1.25-2.5 mg by mouth daily. 0.5 tab on Sun, Tues, and Thurs; 1 tab all other days.    Historical Provider, MD   Physical Exam: Filed Vitals:   05/13/13 0238  BP: 97/68  Pulse: 96  Resp: 16    General:  NAD, resting comfortably in bed Eyes: PEERLA EOMI ENT: mucous membranes moist Neck: supple w/o JVD Cardiovascular: RRR w/o MRG Respiratory: very tachypnic, accessory muscle use, prolonged expiratory phase, does not appear to be in respiratory distress however and when questioned about this she and daughter say that this mode of breathing is fairly baseline for her with any activity. Abdomen: soft, nt, nd, bs+ Skin: no rash nor lesion Musculoskeletal: MAE, full ROM all 4 extremities Psychiatric: normal tone and affect Neurologic: AAOx3, grossly non-focal  Labs on Admission:  Basic Metabolic Panel:  Recent Labs Lab 05/12/13 1830  NA 142  K 4.2  CL 101  CO2 33*  GLUCOSE 88  BUN 18  CREATININE 0.97  CALCIUM 9.1   Liver Function Tests:  Recent Labs Lab 05/12/13 1830  AST 23  ALT 10  ALKPHOS 72  BILITOT 0.8  PROT 6.3  ALBUMIN 3.4*   No results found for this basename: LIPASE, AMYLASE,  in the last 168 hours No results found for this basename: AMMONIA,  in the last 168 hours CBC:  Recent Labs Lab 05/12/13 1830   WBC 5.1  NEUTROABS 3.5  HGB 11.5*  HCT 36.9  MCV 83.7  PLT 176   Cardiac Enzymes: No results found for this basename: CKTOTAL, CKMB, CKMBINDEX, TROPONINI,  in the last 168 hours  BNP (last 3 results)  Recent Labs  03/26/13 0545 04/07/13 2210 05/12/13 1830  PROBNP 3496.0* 10489.0* 4282.0*   CBG: No results found for this basename: GLUCAP,  in the last 168 hours  Radiological Exams on Admission: Dg Chest 2 View  05/12/2013   CLINICAL DATA:  Shortness of breath. Atrial fibrillation.  EXAM: CHEST  2 VIEW  COMPARISON:  04/07/2013.  FINDINGS:  The heart is enlarged but stable. Moderate central vascular congestion and probable mild interstitial edema with bilateral pleural effusions, right greater than left. The bony thorax is intact.  IMPRESSION: Mild CHF.   Electronically Signed   By: Loralie Champagne M.D.   On: 05/12/2013 19:23    EKG: Independently reviewed.  Assessment/Plan Principal Problem:   Acute respiratory failure with hypoxia Active Problems:   COPD (chronic obstructive pulmonary disease)   UTI (lower urinary tract infection)   1. Acute COPD exacerbation - O2 PRN, adult wheeze protocol, not really bronchitis or productive cough to make me want to start ABx at this stage, objectively patient appears very tachypnic and using accessory muscles as well as desating down to 89% on RA with ambulation, but subjectively it sounds as though much of this may be fairly baseline for her given findings as noted in HPI. 2. UTI - will treat UTI with rocephin, continue patient on home flagyl to treat C.Diff which was diagnosed earlier this month.    Code Status: Full Code (must indicate code status--if unknown or must be presumed, indicate so) Family Communication: Spoke with daughter at bedside (indicate person spoken with, if applicable, with phone number if by telephone) Disposition Plan: Admit to obs (indicate anticipated LOS)  Time spent: 70 min  Jodeci Roarty M. [facility] Pager [phone]  If 7PM-7AM, please contact night-coverage [email or web address] Password [facility] 05/13/2013, 2:44 AM

## 2013-05-13 NOTE — Progress Notes (Signed)
ANTICOAGULATION CONSULT NOTE - Initial Consult  Pharmacy Consult for Coumadin Indication: atrial fibrillation  Allergies  Allergen Reactions  . Pneumococcal Vaccines   . Atorvastatin Other (See Comments)    Severe Leg Cramps  . Ciprofloxacin Hcl Nausea And Vomiting  . Morphine Sulfate Nausea Only  . Penicillins Nausea And Vomiting  . Prednisone Other (See Comments)    Weight gain and md said shouldn't take with afib meds  . Sotalol Hcl Nausea And Vomiting  . Sulfonamide Derivatives Nausea Only    Vital Signs: Temp: 98.1 F (36.7 C) (09/29 1522) Temp src: Temporal (09/29 1522) BP: 97/68 mmHg (09/30 0238) Pulse Rate: 96 (09/30 0238)  Labs:  Recent Labs  05/12/13 1830  HGB 11.5*  HCT 36.9  PLT 176  LABPROT 23.8*  INR 2.21*  CREATININE 0.97     Medical History: Past Medical History  Diagnosis Date  . Personal history of colonic polyps   . Allergic rhinitis, cause unspecified   . Unspecified asthma(493.90)   . Personal history of other diseases of digestive system   . Personal history of urinary calculi   . Osteoporosis, unspecified   . DJD (degenerative joint disease)   . HTN (hypertension)   . Hyperlipidemia   . Anxiety state, unspecified   . Mitral valve regurgitation, mod. on TEE 2004, & mild to mod TR, EF 60%. 01/20/2013  . Pelvic fracture, recent history of 01/20/2013    s/p fall from a ladder  . Atrial fibrillation, permanent     Hx. of failed DCCV  . CHF (congestive heart failure)     Diastolic  . Moderate aortic stenosis 01/2013    Assessment: 79yo female c/o SOB x3d, admitted w/ acute respiratory failure, to continue Coumadin for Afib; admitted w/ therapeutic INR.  Goal of Therapy:  INR 2-3   Plan:  Will continue home Coumadin dose of 2.5mg  MWFS and 1.25mg  STT and monitor INR for adjustments if needed.  Vernard Gambles, PharmD, BCPS  05/13/2013,3:00 AM

## 2013-05-13 NOTE — Progress Notes (Addendum)
[facility] PROGRESS NOTE  Joanna Salas ZOX:[id number] DOB: [birth year] DOA: 05/12/2013 PCP: Jeoffrey Massed, MD  Assessment/Plan: 1. COPD/asthma exacerbation. Will continue prednisone at 40 mg by mouth daily, duo nebs, empiric antibiotic therapy with IV ceftriaxone. Provide supportive care, supplemental oxygen. 2. Acute hypoxemic respiratory failure. Evidence by patient is in no respiratory distress, requiring accessory muscles, descending into the upper 80s on room air. She appears improved this morning. Symptoms likely secondary to COPD/asthma exacerbation 3. Urinary tract infection. Will continue Rocephin 1 g IV every 24 hours, followup on urine cultures. 4. History of C diff. Patient was diagnosed with C diff colitis earlier this month and is on Flagyl therapy. Stable.   5. Atrial fibrillation. Presently rate controlled, telemetry showing ventricular rates in the 70s to 90s range. Continue metoprolol 75 mg by mouth twice a day 6. Diastolic congestive heart failure, acute on chronic, last transthoracic echocardiogram performed on 01/20/2013 showing preserved ejection fraction of 55-60%, with wall thickness increased in a pattern of mild LVH. Labs showing an elevated BNP at 4,282. She was given Laxix 40mg  IV in the ER over night, will continue lasix at 60 mg po q daily. I suspect acute CHF may have precipitated COPD exacerbation.  7. Chronic anticoagulation. Patient on warfarin therapy for atrial fibrillation, having a therapeutic INR at 2.11 and PTT of 23.0. 8. Hypertension. Stable continue beta blocker there 9. DVT prophylaxis. Patient fully anticoagulated  Code Status: full code Family Communication: plan discussed with patient and daughter present at bedside Disposition Plan: Will monitor for the next 24 hours, given clinical improvement I anticipate she will be stable for discharge the next day or 2.   Antibiotics:  Ceftriaxone (  05/12/2013)  Flagyl  HPI/Subjective: Patient admitted overnight, presented with complaints of increasing shortness of breath, wheezing, cough, progressively worse over the past 3 days. She was admitted to telemetry for COPD/asthma exacerbation. She reports having some improvement this morning though continues to have persistent wheezes as well as some shortness of breath. She states not quite being at her baseline.  Objective: Filed Vitals:   05/13/13 1102  BP: 104/51  Pulse: 97  Temp:   Resp:     Intake/Output Summary (Last 24 hours) at 05/13/13 1256 Last data filed at 05/13/13 1115  Gross per 24 hour  Intake    600 ml  Output      0 ml  Net    600 ml   Filed Weights   05/13/13 0320  Weight: 66.86 kg (147 lb 6.4 oz)    Exam:   General:  Patient is in no acute distress she is awake alert. She has using accessory muscles during my encounter  Cardiovascular: irregular rate and rhythm, 2/6 systolic ejection murmur  Respiratory: diminished breath sounds bilaterally, positive by basilar crackles, expiratory wheezes present  Abdomen: soft nontender nondistended positive bowel sounds  Musculoskeletal: preserved range of motion throat for  Extremity: no cyanosis clubbing or edema   Data Reviewed: Basic Metabolic Panel:  Recent Labs Lab 05/12/13 1830  NA 142  K 4.2  CL 101  CO2 33*  GLUCOSE 88  BUN 18  CREATININE 0.97  CALCIUM 9.1   Liver Function Tests:  Recent Labs Lab 05/12/13 1830  AST 23  ALT 10  ALKPHOS 72  BILITOT 0.8  PROT 6.3  ALBUMIN 3.4*   No results found for this basename: LIPASE, AMYLASE,  in the last 168 hours No results found for this basename: AMMONIA,  in the last 168  hours CBC:  Recent Labs Lab 05/12/13 1830 05/13/13 1205  WBC 5.1 4.9  NEUTROABS 3.5  --   HGB 11.5* 11.2*  HCT 36.9 36.6  MCV 83.7 83.8  PLT 176 171   Cardiac Enzymes: No results found for this basename: CKTOTAL, CKMB, CKMBINDEX, TROPONINI,  in the last 168  hours BNP (last 3 results)  Recent Labs  03/26/13 0545 04/07/13 2210 05/12/13 1830  PROBNP 3496.0* 10489.0* 4282.0*   CBG: No results found for this basename: GLUCAP,  in the last 168 hours  No results found for this or any previous visit (from the past 240 hour(s)).   Studies: Dg Chest 2 View  05/12/2013   CLINICAL DATA:  Shortness of breath. Atrial fibrillation.  EXAM: CHEST  2 VIEW  COMPARISON:  04/07/2013.  FINDINGS: The heart is enlarged but stable. Moderate central vascular congestion and probable mild interstitial edema with bilateral pleural effusions, right greater than left. The bony thorax is intact.  IMPRESSION: Mild CHF.   Electronically Signed   By: Loralie Champagne M.D.   On: 05/12/2013 19:23    Scheduled Meds: . cefTRIAXone (ROCEPHIN)  IV  1 g Intravenous Q24H  . citalopram  10 mg Oral Daily  . furosemide  40 mg Oral Daily  . loratadine  10 mg Oral Daily  . metoprolol  75 mg Oral BID  . metroNIDAZOLE  500 mg Oral Q8H  . potassium chloride SA  40 mEq Oral Daily  . sodium chloride  3 mL Intravenous Q12H  . verapamil  240 mg Oral Daily  . warfarin  1.25 mg Oral Custom  . [START ON 05/14/2013] warfarin  2.5 mg Oral Q M,W,F,Sa-1800  . Warfarin - Pharmacist Dosing Inpatient   Does not apply q1800   Continuous Infusions:   Principal Problem:   Acute respiratory failure with hypoxia Active Problems:   COPD (chronic obstructive pulmonary disease)   UTI (lower urinary tract infection)    Time spent: 35 minutes    Jeralyn Bennett  [facility] Pager [phone]. If 7PM-7AM, please contact night-coverage at [email or web address], password [place] 05/13/2013, 12:56 PM  LOS: 1 day

## 2013-05-13 NOTE — Progress Notes (Signed)
UR Completed Joandy Burget Graves-Bigelow, RN,BSN [phone]  

## 2013-05-14 DIAGNOSIS — A0472 Enterocolitis due to Clostridium difficile, not specified as recurrent: Secondary | ICD-10-CM

## 2013-05-14 LAB — URINE CULTURE
Colony Count: >=100000
Special Requests: NORMAL

## 2013-05-14 LAB — PROTIME-INR
INR: 2.04 — ABNORMAL HIGH (ref 0.00–1.49)
Prothrombin Time: 22.4 seconds — ABNORMAL HIGH (ref 11.6–15.2)

## 2013-05-14 MED ORDER — IPRATROPIUM BROMIDE 0.02 % IN SOLN
0.5000 mg | Freq: Three times a day (TID) | RESPIRATORY_TRACT | Status: DC
  Administered 2013-05-15 (×2): 0.5 mg via RESPIRATORY_TRACT
  Filled 2013-05-14 (×2): qty 2.5

## 2013-05-14 MED ORDER — ALBUTEROL SULFATE (5 MG/ML) 0.5% IN NEBU
2.5000 mg | INHALATION_SOLUTION | Freq: Three times a day (TID) | RESPIRATORY_TRACT | Status: DC
  Administered 2013-05-15 (×2): 2.5 mg via RESPIRATORY_TRACT
  Filled 2013-05-14 (×2): qty 0.5

## 2013-05-14 NOTE — Progress Notes (Signed)
ANTICOAGULATION CONSULT NOTE - Follow Up Consult  Pharmacy Consult for Coumadin Indication: atrial fibrillation  Allergies  Allergen Reactions  . Pneumococcal Vaccines   . Atorvastatin Other (See Comments)    Severe Leg Cramps  . Ciprofloxacin Hcl Nausea And Vomiting  . Morphine Sulfate Nausea Only  . Penicillins Nausea And Vomiting  . Prednisone Other (See Comments)    Weight gain and md said shouldn't take with afib meds  . Sotalol Hcl Nausea And Vomiting  . Sulfonamide Derivatives Nausea Only    Patient Measurements: Height: 5\' 6"  (167.6 cm) Weight: 147 lb 6.4 oz (66.86 kg) IBW/kg (Calculated) : 59.3  Vital Signs: Temp: 98.3 F (36.8 C) (10/01 0711) Temp src: Axillary (10/01 0711) BP: 118/70 mmHg (10/01 0711) Pulse Rate: 108 (10/01 0711)  Labs:  Recent Labs  05/12/13 1830 05/13/13 1205 05/14/13 0425  HGB 11.5* 11.2*  --   HCT 36.9 36.6  --   PLT 176 171  --   LABPROT 23.8* 23.0* 22.4*  INR 2.21* 2.11* 2.04*  CREATININE 0.97 1.06  --     Estimated Creatinine Clearance: 35.7 ml/min (by C-G formula based on Cr of 1.06).  Assessment: 88yof continues on coumadin for afib with a therapeutic INR on her home dose. CBC is stable. No bleeding reported.  Goal of Therapy:  INR 2-3 Monitor platelets by anticoagulation protocol: Yes   Plan:  1) Continue coumadin 2.5mg  MWFSa, 1.25mg  SunTT 2) INR in AM  Fredrik Rigger 05/14/2013,11:01 AM

## 2013-05-14 NOTE — Progress Notes (Signed)
[facility] PROGRESS NOTE  Joanna Salas NWG:[id number] DOB: 10-Jul-1925 DOA: 05/12/2013 PCP: Jeoffrey Massed, MD  Assessment/Plan: 1. COPD/asthma exacerbation. Will continue prednisone at 40 mg by mouth daily, duo nebs, empiric antibiotic therapy with IV ceftriaxone. Provide supportive care, supplemental oxygen. 2. Acute hypoxemic respiratory failure. appears improved this morning. Symptoms likely secondary to COPD/asthma exacerbation 3. Urinary tract infection. D/c Rocephin 1 g IV every 24 hours, followup on urine cultures show multiple colonies 4. History of C diff. Patient was diagnosed with C diff colitis earlier this month; treated with flagyl- recheck   5. Atrial fibrillation. Presently rate controlled, telemetry showing ventricular rates in the 70s to 90s range. Continue metoprolol 75 mg by mouth twice a day 6. Diastolic congestive heart failure, acute on chronic, last transthoracic echocardiogram performed on 01/20/2013 showing preserved ejection fraction of 55-60%, with wall thickness increased in a pattern of mild LVH. Labs showing an elevated BNP at 4,282. She was given Laxix 40mg  IV in the ER over night, will continue lasix at 60 mg po q daily. I suspect acute CHF may have precipitated COPD exacerbation.  7. Chronic anticoagulation. Patient on warfarin therapy for atrial fibrillation 8. Hypertension. Stable continue beta blocker there 9. DVT prophylaxis. Patient fully anticoagulated  Code Status: full code Family Communication: plan discussed with patient and daughter on phone Disposition Plan: PT eval and hope for d/c in AM.   Antibiotics:  Ceftriaxone ( 05/12/2013)  Flagyl  HPI/Subjective: Patient admitted overnight, presented with complaints of increasing shortness of breath, wheezing, cough, progressively worse over the past 3 days. She was admitted to telemetry for COPD/asthma exacerbation. She reports having some improvement this morning though continues to have  persistent wheezes as well as some shortness of breath. She states not quite being at her baseline.  Objective: Filed Vitals:   05/14/13 0711  BP: 118/70  Pulse: 108  Temp: 98.3 F (36.8 C)  Resp:     Intake/Output Summary (Last 24 hours) at 05/14/13 1017 Last data filed at 05/14/13 0935  Gross per 24 hour  Intake    840 ml  Output      0 ml  Net    840 ml   Filed Weights   05/13/13 0320  Weight: 66.86 kg (147 lb 6.4 oz)    Exam:   General:  Patient is in no acute distress she is awake alert. She has using accessory muscles during my encounter  Cardiovascular: irregular rate and rhythm, 2/6 systolic ejection murmur  Respiratory: diminished breath sounds bilaterally, no wheezing  Abdomen: soft nontender nondistended positive bowel sounds  Musculoskeletal: preserved range of motion throat for  Extremity: no cyanosis clubbing or edema   Data Reviewed: Basic Metabolic Panel:  Recent Labs Lab 05/12/13 1830 05/13/13 1205  NA 142 142  K 4.2 4.6  CL 101 100  CO2 33* 36*  GLUCOSE 88 104*  BUN 18 17  CREATININE 0.97 1.06  CALCIUM 9.1 9.3   Liver Function Tests:  Recent Labs Lab 05/12/13 1830  AST 23  ALT 10  ALKPHOS 72  BILITOT 0.8  PROT 6.3  ALBUMIN 3.4*   No results found for this basename: LIPASE, AMYLASE,  in the last 168 hours No results found for this basename: AMMONIA,  in the last 168 hours CBC:  Recent Labs Lab 05/12/13 1830 05/13/13 1205  WBC 5.1 4.9  NEUTROABS 3.5  --   HGB 11.5* 11.2*  HCT 36.9 36.6  MCV 83.7 83.8  PLT 176 171  Cardiac Enzymes: No results found for this basename: CKTOTAL, CKMB, CKMBINDEX, TROPONINI,  in the last 168 hours BNP (last 3 results)  Recent Labs  03/26/13 0545 04/07/13 2210 05/12/13 1830  PROBNP 3496.0* 10489.0* 4282.0*   CBG: No results found for this basename: GLUCAP,  in the last 168 hours  Recent Results (from the past 240 hour(s))  URINE CULTURE     Status: None   Collection Time     05/12/13  9:32 PM      Result Value Range Status   Specimen Description URINE, CLEAN CATCH   Final   Special Requests Normal   Final   Culture  Setup Time     Final   Value: 05/12/2013 23:05     Performed at [facility] Count     Final   Value: >=100,000 COLONIES/ML     Performed at [facility]   Culture     Final   Value: Multiple bacterial morphotypes present, none predominant. Suggest appropriate recollection if clinically indicated.     Performed at [facility]   Report Status 05/14/2013 FINAL   Final     Studies: Dg Chest 2 View  05/12/2013   CLINICAL DATA:  Shortness of breath. Atrial fibrillation.  EXAM: CHEST  2 VIEW  COMPARISON:  04/07/2013.  FINDINGS: The heart is enlarged but stable. Moderate central vascular congestion and probable mild interstitial edema with bilateral pleural effusions, right greater than left. The bony thorax is intact.  IMPRESSION: Mild CHF.   Electronically Signed   By: Loralie Champagne M.D.   On: 05/12/2013 19:23    Scheduled Meds: . ipratropium  0.5 mg Nebulization Q6H   And  . albuterol  2.5 mg Nebulization Q6H  . cefTRIAXone (ROCEPHIN)  IV  1 g Intravenous Q24H  . citalopram  10 mg Oral Daily  . furosemide  60 mg Oral Daily  . loratadine  10 mg Oral Daily  . metoprolol  75 mg Oral BID  . metroNIDAZOLE  500 mg Oral Q8H  . potassium chloride SA  40 mEq Oral Daily  . predniSONE  40 mg Oral Q breakfast  . sodium chloride  3 mL Intravenous Q12H  . verapamil  240 mg Oral Daily  . warfarin  1.25 mg Oral Custom  . warfarin  2.5 mg Oral Q M,W,F,Sa-1800  . Warfarin - Pharmacist Dosing Inpatient   Does not apply q1800   Continuous Infusions:   Principal Problem:   Acute respiratory failure with hypoxia Active Problems:   COPD (chronic obstructive pulmonary disease)   UTI (lower urinary tract infection)   Chronic diastolic CHF (congestive heart failure)   Enteritis due to Clostridium difficile    A-fib    Time spent: 35 minutes    Lenna Hagarty  [facility] Pager [phone]. If 7PM-7AM, please contact night-coverage at [email or web address], password [facility] 05/14/2013, 10:17 AM  LOS: 2 days

## 2013-05-14 NOTE — ED Provider Notes (Signed)
Medical screening examination/treatment/procedure(s) were conducted as a shared visit with non-physician practitioner(s) and myself.  I personally evaluated the patient during the encounter  I interviewed and examined the patient. Prolonged exp phase on lung exam. Cardiac exam wnl. Abdomen soft. Will admit.    Junius Argyle, MD 05/14/13 2155

## 2013-05-14 NOTE — Progress Notes (Signed)
Patient Saturations on Room Air at Rest = 98%  Patient Saturations on [employer] while Ambulating = 94%

## 2013-05-15 DIAGNOSIS — J45909 Unspecified asthma, uncomplicated: Secondary | ICD-10-CM

## 2013-05-15 LAB — CBC
HCT: 35.6 % — ABNORMAL LOW (ref 36.0–46.0)
Hemoglobin: 11.1 g/dL — ABNORMAL LOW (ref 12.0–15.0)
MCH: 25.9 pg — ABNORMAL LOW (ref 26.0–34.0)
MCHC: 31.2 g/dL (ref 30.0–36.0)
MCV: 83 fL (ref 78.0–100.0)
Platelets: 167 10*3/uL (ref 150–400)
RBC: 4.29 MIL/uL (ref 3.87–5.11)
WBC: 5.7 10*3/uL (ref 4.0–10.5)

## 2013-05-15 LAB — BASIC METABOLIC PANEL
BUN: 22 mg/dL (ref 6–23)
CO2: 33 mEq/L — ABNORMAL HIGH (ref 19–32)
Calcium: 9.5 mg/dL (ref 8.4–10.5)
Chloride: 98 mEq/L (ref 96–112)
Creatinine, Ser: 1.29 mg/dL — ABNORMAL HIGH (ref 0.50–1.10)
Glucose, Bld: 92 mg/dL (ref 70–99)
Sodium: 139 mEq/L (ref 135–145)

## 2013-05-15 MED ORDER — GUAIFENESIN ER 600 MG PO TB12: 600.0000 mg | ORAL_TABLET | Freq: Two times a day (BID) | ORAL | Status: DC

## 2013-05-15 MED ORDER — FUROSEMIDE 40 MG PO TABS
40.0000 mg | ORAL_TABLET | Freq: Every day | ORAL | Status: DC
  Administered 2013-05-15: 40 mg via ORAL
  Filled 2013-05-15: qty 1

## 2013-05-15 MED ORDER — WARFARIN 1.25 MG HALF TABLET
1.2500 mg | ORAL_TABLET | Freq: Once | ORAL | Status: DC
  Filled 2013-05-15: qty 1

## 2013-05-15 MED ORDER — PREDNISONE 10 MG PO TABS: ORAL_TABLET | ORAL | Status: DC

## 2013-05-15 MED ORDER — GUAIFENESIN ER 600 MG PO TB12
600.0000 mg | ORAL_TABLET | Freq: Two times a day (BID) | ORAL | Status: DC
  Administered 2013-05-15: 600 mg via ORAL
  Filled 2013-05-15 (×2): qty 1

## 2013-05-15 MED ORDER — ALBUTEROL SULFATE (5 MG/ML) 0.5% IN NEBU: 2.5000 mg | INHALATION_SOLUTION | Freq: Three times a day (TID) | RESPIRATORY_TRACT | Status: DC

## 2013-05-15 MED ORDER — IPRATROPIUM BROMIDE 0.02 % IN SOLN: 0.5000 mg | Freq: Three times a day (TID) | RESPIRATORY_TRACT | Status: DC

## 2013-05-15 NOTE — Progress Notes (Signed)
ANTICOAGULATION CONSULT NOTE - Follow Up Consult  Pharmacy Consult for Warfarin Indication: atrial fibrillation  Allergies  Allergen Reactions  . Pneumococcal Vaccines   . Atorvastatin Other (See Comments)    Severe Leg Cramps  . Ciprofloxacin Hcl Nausea And Vomiting  . Morphine Sulfate Nausea Only  . Penicillins Nausea And Vomiting  . Prednisone Other (See Comments)    Weight gain and md said shouldn't take with afib meds  . Sotalol Hcl Nausea And Vomiting  . Sulfonamide Derivatives Nausea Only    Patient Measurements: Height: 5\' 6"  (167.6 cm) Weight: 147 lb 6.4 oz (66.86 kg) IBW/kg (Calculated) : 59.3  Vital Signs: Temp: 98 Salas (36.7 C) (10/02 0556) Temp src: Oral (10/02 0556) BP: 123/79 mmHg (10/02 1045) Pulse Rate: 104 (10/02 1045)  Labs:  Recent Labs  05/12/13 1830 05/13/13 1205 05/14/13 0425 05/15/13 0535  HGB 11.5* 11.2*  --  11.1*  HCT 36.9 36.6  --  35.6*  PLT 176 171  --  167  LABPROT 23.8* 23.0* 22.4* 21.3*  INR 2.21* 2.11* 2.04* 1.91*  CREATININE 0.97 1.06  --  1.29*    Estimated Creatinine Clearance: 29.3 ml/min (by C-G formula based on Cr of 1.29).  Assessment: Joanna Salas on chronic warfarin for afib. CBC stable, Scr 1.29<1.06 with CrCl ~ 30. No overt bleeding noted.   Goal of Therapy:  INR 2-3 Monitor platelets by anticoagulation protocol: Yes   Plan:  -Warfarin 2.5mg  Mon/Wed/Fri/Sat, 1.25mg  Sun/Tues/Thurs (will give an extra 1.25 mg today with INR 1.91) -Daily PT/INR -Monitor for bleeding -Possible DC today  Thank you for allowing me to take part in this patient's care,  Abran Duke, PharmD Clinical Pharmacist Phone: [phone] Pager: [phone] 05/15/2013 1:08 PM

## 2013-05-15 NOTE — Care Management Note (Signed)
    Page 1 of 1   05/15/2013     11:51:12 AM   CARE MANAGEMENT NOTE 05/15/2013  Patient:  Joanna Salas, Joanna Salas   Account Number:  [id number]  Date Initiated:  05/15/2013  Documentation initiated by:  GRAVES-BIGELOW,Drevin Ortner  Subjective/Objective Assessment:   Pt admitted for COPD exacerbation. Pt says with dauhgter. Currently active with [facility]. CM did call Genev[facility] Norlander to make them aware that pt is here. SOC to begin within 24-48 hours post d/c. MD pt will need resumption orders RN/PT.     Action/Plan:   CM did provide daughter and pt information on the life line system.   Anticipated DC Date:  05/15/2013   Anticipated DC Plan:  HOME W HOME HEALTH SERVICES      DC Planning Services  CM consult      [facility] Choice  HOME HEALTH  Resumption Of Svcs/PTA Provider   Choice offered to / List presented to:  C-4 Adult Children        HH arranged  HH-1 RN  HH-10 DISEASE MANAGEMENT  HH-2 PT      [facility] agency  [facility]   Status of service:  Completed, signed off Medicare Important Message given?   (If response is "NO", the following Medicare IM given date fields will be blank) Date Medicare IM given:   Date Additional Medicare IM given:    Discharge Disposition:  HOME W HOME HEALTH SERVICES  Per UR Regulation:  Reviewed for med. necessity/level of care/duration of stay  If discussed at Long Length of Stay Meetings, dates discussed:    Comments:

## 2013-05-15 NOTE — Progress Notes (Signed)
Met with Mrs Adney at bedside to offer [facility] Care Management services. She reports she does not think she needs this service at this time. Left brochure at bedside for her to call in case she changes her mind in the future.  Raiford Noble, MSN-Ed, RN, BSN- [facility] [phone]

## 2013-05-15 NOTE — Discharge Summary (Signed)
Physician Discharge Summary  Joanna Salas:[id number] DOB: 1924-11-29 DOA: 05/12/2013  PCP: Jeoffrey Massed, MD  Admit date: 05/12/2013 Discharge date: 05/15/2013  Time spent: 35 minutes  Recommendations for Outpatient Follow-up:  1. Resume home health PT/RN 2. BMP 1 week  Discharge Diagnoses:  Principal Problem:   Acute respiratory failure with hypoxia Active Problems:   COPD (chronic obstructive pulmonary disease)   UTI (lower urinary tract infection)   Chronic diastolic CHF (congestive heart failure)   Enteritis due to Clostridium difficile   A-fib   Discharge Condition: improved  Diet recommendation: cardiac  Filed Weights   05/13/13 0320  Weight: 66.86 kg (147 lb 6.4 oz)    History of present illness:  Joanna Salas is a 79 y.o. female who presents to the ED with wheezing, shortness of breath. Symptoms onset 3 days ago, progressively worsening. Albuterol MDI hasnt been helping. Pt also has h/o A.Fib, CHF. No F/C, N/V. Work up in ED c/w a very mild COPD exacerbation, but when they got patient up to ambulate, she desaturated down to 89%. Thankfully this resolved at rest and she is sating 97% while at rest.  There is some question as to what her baseline ambulatory saturation is on RA as it sounds as though her pulmonologist has brought up the issue of home oxygen with the patient previously (which would therefore suggest that her baseline O2 sat while ambulating on room air is < 88%). Hospitalist has been asked to admit however as she is not currently on any home O2.   Hospital Course:  1. COPD/asthma exacerbation. Quick steroid taper, duo nebs- will ask for nebulizer at home- patient needs to follow up with Dr. Maple Hudson, treated with short course of IV abx, mucinex. Provide supportive care, supplemental oxygen- did not qualify for home O2-sats upper 90s with ambulation 2. Acute hypoxemic respiratory failure: improved; Symptoms likely secondary to COPD/asthma  exacerbation 3. Urinary tract infection. D/c Rocephin; followup on urine cultures show multiple colonies 4. History of C diff. Patient was diagnosed with C diff colitis earlier this month; treated with flagyl- BMs now normal- no sign of c diff- avoid abx 5. Atrial fibrillation. Presently rate controlled, telemetry showing ventricular rates in the 70s to 90s range. Continue metoprolol 75 mg by mouth twice a day, on coumadin 6. Diastolic congestive heart failure, acute on chronic, last transthoracic echocardiogram performed on 01/20/2013 showing preserved ejection fraction of 55-60%, with wall thickness increased in a pattern of mild LVH. Labs showing an elevated BNP at 4,282. She was given Laxix 40mg  IV in the ER, increased lasix dose but mild bump of Cr, so resumed home dose I suspect acute CHF may have precipitated COPD exacerbation- patient follows with Dr. Allyson Sabal and has appointment in AM- asked patient to weigh herself daily-- dry weight is 147 per patient.  7. Chronic anticoagulation. Patient on warfarin therapy for atrial fibrillation 8. Hypertension. Stable continue beta blocker  Procedures: None  Consultations:  none  Discharge Exam: BP 123/70 HR: 73  General: A+Ox3, NAD- breathing better, appetite better-asking to go home Cardiovascular: irr Respiratory: decreased BS L>R- no crackles  Discharge Instructions      Discharge Orders   Future Appointments Provider Department Dept Phone   05/16/2013 10:00 AM Runell Gess, MD [facility] [phone]   05/20/2013 10:15 AM Jeoffrey Massed, MD [facility] [phone]   05/29/2013 10:30 AM Phillips Hay, RPH-CPP [facility] [phone]   Future Orders Complete By  Expires   (HEART FAILURE PATIENTS) Call MD:  Anytime you have any of the following symptoms: 1) 3 pound weight gain in 24 hours or 5 pounds in 1 week 2) shortness of breath, with or without a dry hacking cough 3) swelling  in the hands, feet or stomach 4) if you have to sleep on extra pillows at night in order to breathe.  As directed    Diet - low sodium heart healthy  As directed    Discharge instructions  As directed    Comments:     Follow up with Dr. Allyson Sabal tomm PT/INR on Monday Call Dr. Maple Hudson for follow up   Increase activity slowly  As directed        Medication List         acetaminophen 500 MG tablet  Commonly known as:  TYLENOL  Take 1,000 mg by mouth every 6 (six) hours as needed for pain.     albuterol (5 MG/ML) 0.5% nebulizer solution  Commonly known as:  PROVENTIL  Take 0.5 mLs (2.5 mg total) by nebulization 3 (three) times daily.     albuterol 108 (90 BASE) MCG/ACT inhaler  Commonly known as:  PROVENTIL HFA;VENTOLIN HFA  Inhale 2 puffs into the lungs every 4 (four) hours as needed for wheezing or shortness of breath.     ALPRAZolam 0.25 MG tablet  Commonly known as:  XANAX  Take 1 tablet (0.25 mg total) by mouth at bedtime as needed for sleep.     cetirizine 10 MG tablet  Commonly known as:  ZYRTEC  Take 10 mg by mouth at bedtime.     citalopram 10 MG tablet  Commonly known as:  CELEXA  Take 1 tablet (10 mg total) by mouth daily.     furosemide 40 MG tablet  Commonly known as:  LASIX  Take 1 tablet (40 mg total) by mouth daily.     guaiFENesin 600 MG 12 hr tablet  Commonly known as:  MUCINEX  Take 1 tablet (600 mg total) by mouth 2 (two) times daily.     ipratropium 0.02 % nebulizer solution  Commonly known as:  ATROVENT  Take 2.5 mLs (0.5 mg total) by nebulization 3 (three) times daily.     metoprolol 50 MG tablet  Commonly known as:  LOPRESSOR  Take 75 mg by mouth 2 (two) times daily.     ondansetron 4 MG tablet  Commonly known as:  ZOFRAN  Take 4 mg by mouth every 6 (six) hours as needed for nausea.     potassium chloride SA 20 MEQ tablet  Commonly known as:  K-DUR,KLOR-CON  2 tabs po qd     predniSONE 10 MG tablet  Commonly known as:  DELTASONE  30 mg x2  days, 20mg  x 2 day, 10mg  x 2 days     promethazine 12.5 MG tablet  Commonly known as:  PHENERGAN  Take 12.5-25 mg by mouth at bedtime as needed for nausea (dizziness).     ranitidine 150 MG tablet  Commonly known as:  ZANTAC  Take 150 mg by mouth daily as needed for heartburn (indigestion).     sennosides-docusate sodium 8.6-50 MG tablet  Commonly known as:  SENOKOT-S  Take 1 tablet by mouth at bedtime as needed for constipation.     verapamil 240 MG CR tablet  Commonly known as:  CALAN-SR  Take 240 mg by mouth daily.     warfarin 2.5 MG tablet  Commonly known as:  COUMADIN  Take 1.25-2.5 mg by mouth daily. 0.5 tab on Sun, Tues, and Thurs; 1 tab all other days.       Allergies  Allergen Reactions  . Pneumococcal Vaccines   . Atorvastatin Other (See Comments)    Severe Leg Cramps  . Ciprofloxacin Hcl Nausea And Vomiting  . Morphine Sulfate Nausea Only  . Penicillins Nausea And Vomiting  . Prednisone Other (See Comments)    Weight gain and md said shouldn't take with afib meds  . Sotalol Hcl Nausea And Vomiting  . Sulfonamide Derivatives Nausea Only      The results of significant diagnostics from this hospitalization (including imaging, microbiology, ancillary and laboratory) are listed below for reference.    Significant Diagnostic Studies: Dg Chest 2 View  05/12/2013   CLINICAL DATA:  Shortness of breath. Atrial fibrillation.  EXAM: CHEST  2 VIEW  COMPARISON:  04/07/2013.  FINDINGS: The heart is enlarged but stable. Moderate central vascular congestion and probable mild interstitial edema with bilateral pleural effusions, right greater than left. The bony thorax is intact.  IMPRESSION: Mild CHF.   Electronically Signed   By: Loralie Champagne M.D.   On: 05/12/2013 19:23    Microbiology: Recent Results (from the past 240 hour(s))  URINE CULTURE     Status: None   Collection Time    05/12/13  9:32 PM      Result Value Range Status   Specimen Description URINE, CLEAN  CATCH   Final   Special Requests Normal   Final   Culture  Setup Time     Final   Value: 05/12/2013 23:05     Performed at [facility] Count     Final   Value: >=100,000 COLONIES/ML     Performed at [facility]   Culture     Final   Value: Multiple bacterial morphotypes present, none predominant. Suggest appropriate recollection if clinically indicated.     Performed at [facility]   Report Status 05/14/2013 FINAL   Final     Labs: Basic Metabolic Panel:  Recent Labs Lab 05/12/13 1830 05/13/13 1205 05/15/13 0535  NA 142 142 139  K 4.2 4.6 4.5  CL 101 100 98  CO2 33* 36* 33*  GLUCOSE 88 104* 92  BUN 18 17 22   CREATININE 0.97 1.06 1.29*  CALCIUM 9.1 9.3 9.5   Liver Function Tests:  Recent Labs Lab 05/12/13 1830  AST 23  ALT 10  ALKPHOS 72  BILITOT 0.8  PROT 6.3  ALBUMIN 3.4*   No results found for this basename: LIPASE, AMYLASE,  in the last 168 hours No results found for this basename: AMMONIA,  in the last 168 hours CBC:  Recent Labs Lab 05/12/13 1830 05/13/13 1205 05/15/13 0535  WBC 5.1 4.9 5.7  NEUTROABS 3.5  --   --   HGB 11.5* 11.2* 11.1*  HCT 36.9 36.6 35.6*  MCV 83.7 83.8 83.0  PLT 176 171 167   Cardiac Enzymes: No results found for this basename: CKTOTAL, CKMB, CKMBINDEX, TROPONINI,  in the last 168 hours BNP: BNP (last 3 results)  Recent Labs  03/26/13 0545 04/07/13 2210 05/12/13 1830  PROBNP 3496.0* 10489.0* 4282.0*   CBG: No results found for this basename: GLUCAP,  in the last 168 hours     Signed:  Benjamine Mola, Shanan Fitzpatrick  [facility] 05/15/2013, 1:12 PM

## 2013-05-15 NOTE — Evaluation (Signed)
Physical Therapy Evaluation Patient Details Name: Joanna Salas MRN: [id number] DOB: [birth year] Today's Date: 05/15/2013 Time: 6578-4696 PT Time Calculation (min): 16 min  PT Assessment / Plan / Recommendation History of Present Illness  Joanna Salas is a 79 y.o. female who presents to the ED with wheezing, shortness of breath.  Symptoms onset 3 days ago, progressively worsening.  Albuterol MDI hasnt been helping.  Pt also has h/o A.Fib, CHF.  No F/C, N/V.  Work up in ED c/w a very mild COPD exacerbation, but when they got patient up to ambulate, she desaturated down to 89%  Clinical Impression  Pt eager to return to dgtr's house and eventually to her home. Pt states she is actively receiving HHPT with [facility] to work on balance and will benefit from this continued therapy as well as cane use. Pt states she was using cane but doesn't think she needs it despite 3 partial LOB with pt attention called to deficits during gait. Pt educated for need for DME at all times and agreeable that she will use if it will allow her DC. No further acute needs as all education met and further needs can be addressed by HHPT.     PT Assessment  All further PT needs can be met in the next venue of care    Follow Up Recommendations  Home health PT    Does the patient have the potential to tolerate intense rehabilitation      Barriers to Discharge        Equipment Recommendations  None recommended by PT    Recommendations for Other Services     Frequency      Precautions / Restrictions Precautions Precautions: Fall   Pertinent Vitals/Pain No pain sats 90-98% on RA with gait HR 83-88     Mobility  Bed Mobility Bed Mobility: Supine to Sit Supine to Sit: 6: Modified independent (Device/Increase time);HOB flat Transfers Transfers: Sit to Stand;Stand to Sit Sit to Stand: 6: Modified independent (Device/Increase time);From bed Stand to Sit: 6: Modified independent (Device/Increase time);To  chair/3-in-1 Ambulation/Gait Ambulation/Gait Assistance: 4: Min guard Ambulation Distance (Feet): 350 Feet Assistive device: None Ambulation/Gait Assistance Details: Pt with partial LOB and scissoring x 3 during gait. Educated pt for fall risk, balance deficits and need to continue to use cane at all times not just outside. Gait Pattern: Step-through pattern Gait velocity: WFL Stairs: No    Exercises     PT Diagnosis: Abnormality of gait  PT Problem List: Decreased balance;Decreased mobility;Decreased knowledge of use of DME PT Treatment Interventions:       PT Goals(Current goals can be found in the care plan section) Acute Rehab PT Goals PT Goal Formulation: No goals set, d/c therapy  Visit Information  Last PT Received On: 05/15/13 Assistance Needed: +1 History of Present Illness: Joanna Salas is a 79 y.o. female who presents to the ED with wheezing, shortness of breath.  Symptoms onset 3 days ago, progressively worsening.  Albuterol MDI hasnt been helping.  Pt also has h/o A.Fib, CHF.  No F/C, N/V.  Work up in ED c/w a very mild COPD exacerbation, but when they got patient up to ambulate, she desaturated down to 89%       Prior Functioning  Home Living Family/patient expects to be discharged to:: Private residence Living Arrangements: Children Available Help at Discharge: Family;Available PRN/intermittently Type of Home: House Home Access: Ramped entrance Home Layout: One level Home Equipment: Cane - single point;Shower seat;Grab bars -  tub/shower;Bedside commode Additional Comments: living with dgtr for last 4wks Prior Function Level of Independence: Independent with assistive device(s) Communication Communication: No difficulties Dominant Hand: Right    Cognition  Cognition Arousal/Alertness: Awake/alert Behavior During Therapy: WFL for tasks assessed/performed Overall Cognitive Status: Within Functional Limits for tasks assessed    Extremity/Trunk  Assessment Upper Extremity Assessment Upper Extremity Assessment: Overall WFL for tasks assessed Lower Extremity Assessment Lower Extremity Assessment: Overall WFL for tasks assessed Cervical / Trunk Assessment Cervical / Trunk Assessment: Normal   Balance    End of Session PT - End of Session Equipment Utilized During Treatment: Gait belt Activity Tolerance: Patient tolerated treatment well Patient left: in chair;with call bell/phone within reach;with family/visitor present Nurse Communication: Mobility status  GP     Delorse Lek 05/15/2013, 2:51 PM Delaney Meigs, PT [phone]

## 2013-05-16 ENCOUNTER — Ambulatory Visit (INDEPENDENT_AMBULATORY_CARE_PROVIDER_SITE_OTHER): Payer: Medicare Other | Admitting: Pharmacist Clinician (PhC)/ Clinical Pharmacy Specialist

## 2013-05-16 ENCOUNTER — Ambulatory Visit (INDEPENDENT_AMBULATORY_CARE_PROVIDER_SITE_OTHER): Payer: Medicare Other | Admitting: Cardiovascular Disease

## 2013-05-16 ENCOUNTER — Encounter: Payer: Self-pay | Admitting: Cardiovascular Disease

## 2013-05-16 VITALS — BP 124/70 | HR 96 | Ht 66.0 in | Wt 149.0 lb

## 2013-05-16 DIAGNOSIS — Z7901 Long term (current) use of anticoagulants: Secondary | ICD-10-CM

## 2013-05-16 DIAGNOSIS — I38 Endocarditis, valve unspecified: Secondary | ICD-10-CM

## 2013-05-16 DIAGNOSIS — I4891 Unspecified atrial fibrillation: Secondary | ICD-10-CM

## 2013-05-16 DIAGNOSIS — R0989 Other specified symptoms and signs involving the circulatory and respiratory systems: Secondary | ICD-10-CM

## 2013-05-16 DIAGNOSIS — I4821 Permanent atrial fibrillation: Secondary | ICD-10-CM

## 2013-05-16 DIAGNOSIS — I10 Essential (primary) hypertension: Secondary | ICD-10-CM

## 2013-05-16 NOTE — Assessment & Plan Note (Signed)
Well-controlled on current medications 

## 2013-05-16 NOTE — Assessment & Plan Note (Signed)
Persistent atrial fibrillation, rate controlled on Coumadin anticoagulation

## 2013-05-16 NOTE — Progress Notes (Signed)
05/16/2013 GARRY BOCHICCHIO   1924/11/02  [id number]  Primary Physician Jeoffrey Massed, MD Primary Cardiologist: Runell Gess MD Roseanne Reno   HPI:  The patient is an 79 year old, mildly overweight, recently widowed (husband of 40 years died a month ago) Caucasian female, mother of 1 living child who I last saw in October. She has a history of hypertension, chronic A-fib rate controlled on Coumadin anticoagulation. She saw Nada Boozer in the office on September 18, 2012, for dizziness thought to be related to inner ear. She otherwise is asymptomatic although she is very upset about the recent loss of her husband. Since I saw her in the office 11/15/12 she is admitted with iron overload/CHF. She was diureses. She does have normal LV systolic function with mild to moderate aortic stenosis, and aortic valve area of 1.2 cm. She was recently hospitalized at the hospital for respiratory distress secondary to COPD, asthmatic exacerbation and diastolic dysfunction with a BNP in the 4000 range. She was just discharged home yesterday. She will make an appointment to see Dr. Maple Hudson, her pulmonologist as well. She is breathing better. She was given bronchodilators, a steroid taper and IV diuretics.    Current Outpatient Prescriptions  Medication Sig Dispense Refill  . acetaminophen (TYLENOL) 500 MG tablet Take 1,000 mg by mouth every 6 (six) hours as needed for pain.      Marland Kitchen albuterol (PROVENTIL HFA;VENTOLIN HFA) 108 (90 BASE) MCG/ACT inhaler Inhale 2 puffs into the lungs every 4 (four) hours as needed for wheezing or shortness of breath.      Marland Kitchen albuterol (PROVENTIL) (5 MG/ML) 0.5% nebulizer solution Take 0.5 mLs (2.5 mg total) by nebulization 3 (three) times daily.  20 mL  12  . ALPRAZolam (XANAX) 0.25 MG tablet Take 1 tablet (0.25 mg total) by mouth at bedtime as needed for sleep.  90 tablet  0  . cetirizine (ZYRTEC) 10 MG tablet Take 10 mg by mouth at bedtime.      . citalopram (CELEXA)  10 MG tablet Take 1 tablet (10 mg total) by mouth daily.  30 tablet  5  . furosemide (LASIX) 40 MG tablet Take 1 tablet (40 mg total) by mouth daily.  90 tablet  2  . guaiFENesin (MUCINEX) 600 MG 12 hr tablet Take 1 tablet (600 mg total) by mouth 2 (two) times daily.  15 tablet  0  . ipratropium (ATROVENT) 0.02 % nebulizer solution Take 2.5 mLs (0.5 mg total) by nebulization 3 (three) times daily.  75 mL  12  . metoprolol (LOPRESSOR) 50 MG tablet Take 75 mg by mouth 2 (two) times daily.      . ondansetron (ZOFRAN) 4 MG tablet Take 4 mg by mouth every 6 (six) hours as needed for nausea.      . potassium chloride SA (K-DUR,KLOR-CON) 20 MEQ tablet 2 tabs po qd  180 tablet  1  . predniSONE (DELTASONE) 10 MG tablet 30 mg x2 days, 20mg  x 2 day, 10mg  x 2 days  12 tablet  0  . promethazine (PHENERGAN) 12.5 MG tablet Take 12.5-25 mg by mouth at bedtime as needed for nausea (dizziness).      . ranitidine (ZANTAC) 150 MG tablet Take 150 mg by mouth daily as needed for heartburn (indigestion).      . sennosides-docusate sodium (SENOKOT-S) 8.6-50 MG tablet Take 1 tablet by mouth at bedtime as needed for constipation.      . verapamil (CALAN-SR) 240 MG CR tablet Take 240  mg by mouth daily.      Marland Kitchen warfarin (COUMADIN) 2.5 MG tablet Take 1.25-2.5 mg by mouth daily. 0.5 tab on Sun, Tues, and Thurs; 1 tab all other days.       No current facility-administered medications for this visit.    Allergies  Allergen Reactions  . Pneumococcal Vaccines   . Atorvastatin Other (See Comments)    Severe Leg Cramps  . Ciprofloxacin Hcl Nausea And Vomiting  . Morphine Sulfate Nausea Only  . Penicillins Nausea And Vomiting  . Prednisone Other (See Comments)    Weight gain and md said shouldn't take with afib meds  . Sotalol Hcl Nausea And Vomiting  . Sulfonamide Derivatives Nausea Only    History   Social History  . Marital Status: Widowed    Spouse Name: N/A    Number of Children: 2  . Years of Education: N/A    Occupational History  . retired     Education officer, [employer]  .      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 Smoking status: Never Smoker   . Smokeless tobacco: Never Used  . Alcohol Use: No  . Drug Use: No  . Sexual Activity: Not on file   Other Topics Concern  . Not on file   Social History Narrative   Lives in [place].  Daughter, [name], lives next door.   Lost a son (MVA he was 57 y/o), recently widowed 10/2012.   Occupation: 26 yrs for [employer]--retired.   No tob, alc, drugs.     Review of Systems: General: negative for chills, fever, night sweats or weight changes.  Cardiovascular: negative for chest pain, dyspnea on exertion, edema, orthopnea, palpitations, paroxysmal nocturnal dyspnea or shortness of breath Dermatological: negative for rash Respiratory: negative for cough or wheezing Urologic: negative for hematuria Abdominal: negative for nausea, vomiting, diarrhea, bright red blood per rectum, melena, or hematemesis Neurologic: negative for visual changes, syncope, or dizziness All other systems reviewed and are otherwise negative except as noted above.    Blood pressure 124/70, pulse 96, height 5\' 6"  (1.676 m), weight 149 lb (67.586 kg).  General appearance: alert and no distress Neck: no adenopathy, no JVD, supple, symmetrical, trachea midline, thyroid not enlarged, symmetric, no tenderness/mass/nodules and left carotid bruit Lungs: clear to auscultation bilaterally Heart: irregularly irregular rhythm Extremities: extremities normal, atraumatic, no cyanosis or edema  EKG atrial fibrillation with a ventricular response of 96 and low limb voltage with nonspecific ST and T wave changes  ASSESSMENT AND PLAN:   Atrial fibrillation, permanent Persistent atrial fibrillation, rate controlled on Coumadin anticoagulation  Valvular heart disease- mild to moderate AS and MR June 2014 Normal left ventricular systolic function with diastolic dysfunction, mild  aortic stenosis with a valve area of 1.2 cm mild to moderate mitral regurgitation.  HYPERTENSION Well-controlled on current medications      Runell Gess MD [facility], [facility] 05/16/2013 11:00 AM

## 2013-05-16 NOTE — Patient Instructions (Addendum)
  We will see you back in follow up in 3 months with an extender and 6 months with Dr Allyson Sabal.  Dr Allyson Sabal has ordered carotid dopplers (ultrasound of your neck arteries)

## 2013-05-16 NOTE — Assessment & Plan Note (Signed)
Normal left ventricular systolic function with diastolic dysfunction, mild aortic stenosis with a valve area of 1.2 cm mild to moderate mitral regurgitation.

## 2013-05-19 ENCOUNTER — Encounter: Payer: Self-pay | Admitting: Cardiovascular Disease

## 2013-05-20 ENCOUNTER — Encounter: Payer: Self-pay | Admitting: Family Medicine

## 2013-05-20 ENCOUNTER — Ambulatory Visit (INDEPENDENT_AMBULATORY_CARE_PROVIDER_SITE_OTHER): Payer: Medicare Other | Admitting: Family Medicine

## 2013-05-20 VITALS — BP 152/72 | HR 97 | Temp 97.6°F | Resp 18 | Ht 64.5 in | Wt 146.0 lb

## 2013-05-20 DIAGNOSIS — J984 Other disorders of lung: Secondary | ICD-10-CM

## 2013-05-20 DIAGNOSIS — I509 Heart failure, unspecified: Secondary | ICD-10-CM

## 2013-05-20 DIAGNOSIS — N183 Chronic kidney disease, stage 3 unspecified: Secondary | ICD-10-CM

## 2013-05-20 DIAGNOSIS — R0603 Acute respiratory distress: Secondary | ICD-10-CM

## 2013-05-20 DIAGNOSIS — I5032 Chronic diastolic (congestive) heart failure: Secondary | ICD-10-CM

## 2013-05-20 LAB — BASIC METABOLIC PANEL
BUN: 19 mg/dL (ref 6–23)
CO2: 34 mEq/L — ABNORMAL HIGH (ref 19–32)
Calcium: 9.1 mg/dL (ref 8.4–10.5)
Creatinine, Ser: 1.1 mg/dL (ref 0.4–1.2)
GFR: 52.22 mL/min — ABNORMAL LOW (ref >60.00)
Glucose, Bld: 74 mg/dL (ref 70–99)
Sodium: 142 mEq/L (ref 135–145)

## 2013-05-20 NOTE — Assessment & Plan Note (Signed)
Resolved s/p hospitalization and treatment for asthma and decompensated diastolic CHF. She is feeling good now.

## 2013-05-20 NOTE — Assessment & Plan Note (Signed)
Lab Results  Component Value Date   CREATININE 1.29* 05/15/2013   This is a post-diuresis cr.  Will recheck BMET today.

## 2013-05-20 NOTE — Progress Notes (Signed)
OFFICE NOTE  05/20/2013  CC:  Chief Complaint  Patient presents with  . Follow-up     HPI: Patient is a 79 y.o. Caucasian female who is here for f/u recent acute resp failure secondary to pulm edema/decompensated diastolic CHF--was diuresed in hospital (d/c'd approx 5 d/a). Also was felt to have some asthmatic component contributing so she got systemic steroids and nebs--brief steroid taper at d/c as well that she finishes tomorrow. She sees Dr. Maple Hudson at [facility] tomorrow.  She already saw her cardiologist, Dr. Allyson Sabal, shortly after d/c and he has plans to arrange carotid dopplers for her soon.  Feeling much improved regarding breathing, coughing, and energy level. Pt reveals to me today she feels like she is finally climbing out of the depression and turmoil of her husbands death and all the recent medical problems she has had that have drastically changed her life.  Pertinent PMH:  Past Medical History  Diagnosis Date  . Personal history of colonic polyps   . Allergic rhinitis, cause unspecified   . Unspecified asthma(493.90)   . Personal history of other diseases of digestive system   . Personal history of urinary calculi   . Osteoporosis, unspecified   . DJD (degenerative joint disease)   . HTN (hypertension)   . Hyperlipidemia   . Anxiety state, unspecified   . Mitral valve regurgitation, mod. on TEE 2004, & mild to mod TR, EF 60%. 01/20/2013  . Pelvic fracture, recent history of 01/20/2013    s/p fall from a ladder  . Atrial fibrillation, permanent     Hx. of failed DCCV  . CHF (congestive heart failure)     Diastolic  . Moderate aortic stenosis 01/2013    EF60%  . Gallstones 04/08/13    vs biliary sludge on CT scan--no biliary obstruction  . Polycystic kidney disease 04/08/13    Benign appearing on u/s 03/23/13  . Nephrolithiasis 04/08/13    nonobstructive  . History of pelvic fracture summer 2014   Past surgical, social, and family history reviewed and no changes noted since  last office visit.  MEDS:  Outpatient Prescriptions Prior to Visit  Medication Sig Dispense Refill  . albuterol (PROVENTIL HFA;VENTOLIN HFA) 108 (90 BASE) MCG/ACT inhaler Inhale 2 puffs into the lungs every 4 (four) hours as needed for wheezing or shortness of breath.      Marland Kitchen albuterol (PROVENTIL) (5 MG/ML) 0.5% nebulizer solution Take 0.5 mLs (2.5 mg total) by nebulization 3 (three) times daily.  20 mL  12  . ALPRAZolam (XANAX) 0.25 MG tablet Take 1 tablet (0.25 mg total) by mouth at bedtime as needed for sleep.  90 tablet  0  . cetirizine (ZYRTEC) 10 MG tablet Take 10 mg by mouth at bedtime.      . citalopram (CELEXA) 10 MG tablet Take 1 tablet (10 mg total) by mouth daily.  30 tablet  5  . furosemide (LASIX) 40 MG tablet Take 1 tablet (40 mg total) by mouth daily.  90 tablet  2  . guaiFENesin (MUCINEX) 600 MG 12 hr tablet Take 1 tablet (600 mg total) by mouth 2 (two) times daily.  15 tablet  0  . ipratropium (ATROVENT) 0.02 % nebulizer solution Take 2.5 mLs (0.5 mg total) by nebulization 3 (three) times daily.  75 mL  12  . metoprolol (LOPRESSOR) 50 MG tablet Take 75 mg by mouth 2 (two) times daily.      . potassium chloride SA (K-DUR,KLOR-CON) 20 MEQ tablet 2 tabs po qd  180 tablet  1  . predniSONE (DELTASONE) 10 MG tablet 30 mg x2 days, 20mg  x 2 day, 10mg  x 2 days  12 tablet  0  . ranitidine (ZANTAC) 150 MG tablet Take 150 mg by mouth daily as needed for heartburn (indigestion).      . verapamil (CALAN-SR) 240 MG CR tablet Take 240 mg by mouth daily.      Marland Kitchen warfarin (COUMADIN) 2.5 MG tablet Take 1.25-2.5 mg by mouth daily. 0.5 tab on Sun, Tues, and Thurs; 1 tab all other days.      Marland Kitchen acetaminophen (TYLENOL) 500 MG tablet Take 1,000 mg by mouth every 6 (six) hours as needed for pain.      Marland Kitchen ondansetron (ZOFRAN) 4 MG tablet Take 4 mg by mouth every 6 (six) hours as needed for nausea.      . promethazine (PHENERGAN) 12.5 MG tablet Take 12.5-25 mg by mouth at bedtime as needed for nausea  (dizziness).      . sennosides-docusate sodium (SENOKOT-S) 8.6-50 MG tablet Take 1 tablet by mouth at bedtime as needed for constipation.       No facility-administered medications prior to visit.    PE: Blood pressure 152/72, pulse 97, temperature 97.6 F (36.4 C), temperature source Temporal, resp. rate 18, height 5' 4.5" (1.638 m), weight 146 lb (66.225 kg), SpO2 96.00%. Gen: Alert, well appearing.  Patient is oriented to person, place, time, and situation. AFFECT: pleasant, lucid thought and speech. CV: irreg irreg, distant S1 and S2 but approx 2/6 holosystolic murmur appreciated.  No rub. Chest is clear, no wheezing or rales. Normal symmetric air entry throughout both lung fields. No chest wall deformities or tenderness. EXT: some bruising of skin as per normal for coumadin, trace pitting edema, no cyanosis.  IMPRESSION AND PLAN:  Acute respiratory distress Resolved s/p hospitalization and treatment for asthma and decompensated diastolic CHF. She is feeling good now.   CKD (chronic kidney disease) stage 3, GFR 30-59 ml/min Lab Results  Component Value Date   CREATININE 1.29* 05/15/2013   This is a post-diuresis cr.  Will recheck BMET today.     An After Visit Summary was printed and given to the patient.  FOLLOW UP: 4 mo

## 2013-05-21 ENCOUNTER — Encounter: Payer: Self-pay | Admitting: Internal Medicine

## 2013-05-21 ENCOUNTER — Ambulatory Visit (INDEPENDENT_AMBULATORY_CARE_PROVIDER_SITE_OTHER): Payer: Medicare Other | Admitting: Internal Medicine

## 2013-05-21 ENCOUNTER — Ambulatory Visit: Payer: Medicare Other | Admitting: Internal Medicine

## 2013-05-21 VITALS — BP 122/70 | HR 98 | Ht 65.0 in | Wt 144.0 lb

## 2013-05-21 DIAGNOSIS — J449 Chronic obstructive pulmonary disease, unspecified: Secondary | ICD-10-CM

## 2013-05-21 DIAGNOSIS — J309 Allergic rhinitis, unspecified: Secondary | ICD-10-CM

## 2013-05-21 DIAGNOSIS — I4891 Unspecified atrial fibrillation: Secondary | ICD-10-CM

## 2013-05-21 DIAGNOSIS — I4821 Permanent atrial fibrillation: Secondary | ICD-10-CM

## 2013-05-21 DIAGNOSIS — J302 Other seasonal allergic rhinitis: Secondary | ICD-10-CM

## 2013-05-21 NOTE — Progress Notes (Signed)
06/23/11- 86 yoF never smoker followed for asthma, rhinitis, complicated by A. fib, HBP LOV-10/14/2010 She declines flu shot but had pneumonia vaccine. For the past week has intermittent chest congestion and morning cough, some blood in mucus from right nostril. If she hurries she gets short of breath and in the winter she will wheeze. She does not think she has a cold. Denies chest pain, swelling, fever. Husband turns the heat up in the home much too high for her comfort. She is confused about the roles of Qvar and her rescue inhaler.  08/28/11- 86 yoF never smoker followed for asthma, rhinitis, complicated by A. fib, HBP   Had declined flu vax  acute illness-1 week , began with left maxillary and left ear pressure pain, sore throat. She had taken a Medrol taper which opened her and she began coughing out green phlegm. We gave Z-Pak January 8. She blamed Medrol for nausea and vomiting . Now nasal congestion, post nasal drip. Chest is no longer tight. Sputum is clear, no fever or pain.  05/01/12- 86 yoF never smoker followed for asthma, rhinitis, complicated by A. fib, HBP Dry cough and wheezing; has been doing good until having to do yard work; QVAR causing sore throat-doesnt use Aerochamber with it. Blames pollen while working outdoors over the last 2 weeks for increased nasal congestion with recent loud snore. In the morning has raspy cough and wheeze. Using her rescue inhaler once each morning. Does not think she has a cold. Has never had a flu shot. I advised we start but she deferred.  09/12/2012 Acute OV  Complains of wheezing, DOE x1 week .  Complains over the last 3 weeks that she has some nasal congestion, drainage, and cough. Initially called in a Z-Pak which helped with her cough/congestion /sinus symptoms, however, she continues to have a mild dry cough and intermittent wheezing.  Patient denies any discolored mucus, fever, or hemoptysis, orthopnea, PND, or unintentional weight loss. She  has had increased rescue inhaler use.  11/20/12- 87 yoF never smoker followed for asthma, rhinitis, complicated by A. fib, HBP FOLLOWS FOR: having chest congestion/wheezing-feels stuck in throat area-would like to know if she needs neb tx. Watery rhinorrhea, wheezing and shortness of breath the past 2 or 3 days. Doesn't think it's a cold. Taking Zyrtec. Dymista nasal spray did not help. Rescue inhaler works well. Husband died of heart disease/CHF in March.  12/02/12- 87 yoF never smoker followed for asthma, rhinitis, complicated by A. fib, HBP ACUTE VISIT: seen 11-20-2012; still having wheezing(rattles) in chest, cough-non productive; took Zpak and 1 day left of Prednisone. Feels run down. Since last office visit has taken prednisone and a Z-Pak. Malaise is partly from grieving over death of her husband. She still wheezes some and continues to need Zyrtec for spring pollen rhinitis.  02/03/13- 87 yoF never smoker followed for asthma, rhinitis, complicated by A. fib, HBP FOLLOWS FOR: no SOB at this time; doing well overall. Currently at [facility] rehab center . Had fallen off a stepladder and broken pelvis-no surgery. CT chest while in hospital. Husband had died in 2022-11-01.of renal failure. CT chest 01/19/13 IMPRESSION:  1. Technically adequate study. No pulmonary embolism.  2. Cardiomegaly and small bilateral pleural effusions compatible  with failure.  3. Borderline aneurysmal dilation of the ascending aorta at 40 mm.  No acute aortic abnormality identified allowing for phase of  contrast.  Original Report Authenticated By: Andreas Newport, M.D.  05/21/13- 48 yoF never smoker followed for asthma,  rhinitis, complicated by A. fib, HBP FOLLOWS FOR: recent hospital stay for asthma flare up; discuss albuterol neb tx's and a. fib; as well  if she should stay on Mucinex. Lost husband in March. Living w/ daughter Hosp-9/29-10/2: Acute Resp Failure w/ hypoxia, COPD, Chronic diastolic CHF, Afib. Given home  neb. Can't tol TID nebs as ordered, but ok used occ prn.Finishing pred taper.  CXR 05/12/13 IMPRESSION:  Mild CHF.  Electronically Signed  By: Loralie Champagne M.D.  On: 05/12/2013 19:23  ROS-see HPI Constitutional:   No-   weight loss, night sweats, fevers, chills, fatigue, lassitude. HEENT:   No-  headaches, difficulty swallowing, tooth/dental problems, sore throat,       No-  sneezing, itching, ear ache, +nasal congestion, +post nasal drip,  CV:  No-   chest pain, orthopnea, PND, swelling in lower extremities, anasarca, dizziness, palpitations Resp: +  shortness of breath with exertion or at rest.              No-   productive cough,  No non-productive cough,  No- coughing up of blood.              No-   change in color of mucus.  No- wheezing.   Skin: No-   rash or lesions. GI:  No-   heartburn, indigestion, abdominal pain, nausea, vomiting, GU:  MS:  No-   joint pain or swelling.. Neuro-     nothing unusual Psych:  No- change in mood or affect. No depression or anxiety.  No memory loss.  OBJ- Physical Exam General- Alert, Oriented, Affect-appropriate, Distress- none acute, +wheelchair Skin- rash-none, lesions- none, excoriation- none Lymphadenopathy- none Head- atraumatic            Eyes- Gross vision intact, PERRLA, conjunctivae and secretions clear            Ears- Hearing, canals-normal            Nose- +mucus bridging, no-Septal dev,  polyps, erosion, perforation             Throat- Mallampati II , mucosa clear , drainage- none, tonsils- atrophic.+ Dentures Neck- flexible , trachea midline, no stridor , thyroid nl, carotid no bruit Chest - symmetrical excursion , unlabored           Heart/CV- +IRR , no murmur , no gallop  , no rub, nl s1 s2                           - JVD- none , edema- none, stasis changes- none, varices- none           Lung- clear now, no- wheeze, unlabored, cough- none , dullness-none, rub- none           Chest wall-  Abd-  Br/ Gen/ Rectal- Not done,  not indicated Extrem- cyanosis- none, clubbing, none, atrophy- none, strength- nl Neuro- grossly intact to observation

## 2013-05-21 NOTE — Patient Instructions (Signed)
I recommend the flu vaccine for you. You point out that you have never had it  And don't really have a history of reactions to the flu shot.   Ok to just use the Mucinex if needed  Ok to just use the nebulizer if needed- up to 3 times daily

## 2013-05-29 ENCOUNTER — Ambulatory Visit (HOSPITAL_COMMUNITY)
Admission: RE | Admit: 2013-05-29 | Discharge: 2013-05-29 | Disposition: A | Discharge status: Home / Self Care | Payer: Medicare Other | Source: Ambulatory Visit | Attending: Internal Medicine | Admitting: Internal Medicine

## 2013-05-29 ENCOUNTER — Ambulatory Visit: Payer: Medicare Other | Admitting: Pharmacist Clinician (PhC)/ Clinical Pharmacy Specialist

## 2013-05-29 ENCOUNTER — Ambulatory Visit (INDEPENDENT_AMBULATORY_CARE_PROVIDER_SITE_OTHER): Payer: Medicare Other | Admitting: Pharmacist Clinician (PhC)/ Clinical Pharmacy Specialist

## 2013-05-29 VITALS — BP 110/72 | HR 64

## 2013-05-29 DIAGNOSIS — R0989 Other specified symptoms and signs involving the circulatory and respiratory systems: Secondary | ICD-10-CM | POA: Insufficient documentation

## 2013-05-29 DIAGNOSIS — Z7901 Long term (current) use of anticoagulants: Secondary | ICD-10-CM

## 2013-05-29 DIAGNOSIS — I4891 Unspecified atrial fibrillation: Secondary | ICD-10-CM

## 2013-05-29 DIAGNOSIS — I4821 Permanent atrial fibrillation: Secondary | ICD-10-CM

## 2013-05-29 LAB — POCT INR: INR: 2

## 2013-05-29 NOTE — Progress Notes (Signed)
Carotid Duplex Completed. °Brianna L Mazza,RVT °

## 2013-06-03 ENCOUNTER — Ambulatory Visit (INDEPENDENT_AMBULATORY_CARE_PROVIDER_SITE_OTHER): Payer: Medicare Other | Admitting: Family Medicine

## 2013-06-03 ENCOUNTER — Encounter: Payer: Self-pay | Admitting: Family Medicine

## 2013-06-03 VITALS — BP 157/90 | HR 89 | Temp 97.8°F | Resp 28 | Ht 64.5 in | Wt 149.0 lb

## 2013-06-03 DIAGNOSIS — J45901 Unspecified asthma with (acute) exacerbation: Secondary | ICD-10-CM

## 2013-06-03 MED ORDER — PREDNISONE 20 MG PO TABS: ORAL_TABLET | ORAL | Status: DC

## 2013-06-03 MED ORDER — IPRATROPIUM BROMIDE 0.02 % IN SOLN
0.5000 mg | Freq: Once | RESPIRATORY_TRACT | Status: AC
  Administered 2013-06-03: 0.5 mg via RESPIRATORY_TRACT

## 2013-06-03 MED ORDER — ALBUTEROL SULFATE (2.5 MG/3ML) 0.083% IN NEBU
2.5000 mg | INHALATION_SOLUTION | Freq: Once | RESPIRATORY_TRACT | Status: AC
  Administered 2013-06-03: 2.5 mg via RESPIRATORY_TRACT

## 2013-06-03 NOTE — Progress Notes (Signed)
OFFICE NOTE  06/03/2013  CC:  Chief Complaint  Patient presents with  . Cough    taking mucinex  . Shortness of Breath     HPI: Patient is a 79 y.o. Caucasian female who is here for cough and SOB. Has had nasal congestion/runny nose for about a week, mucous initially clear but this morning noted to be green. Also over the last 24h has had more SOB and dry cough.  Used albut neb last night but not today.  No chest pain or worsening of LE swelling. No fevers, no face pain, no HA, no ST.   Pertinent PMH:  Past Medical History  Diagnosis Date  . Personal history of colonic polyps   . Allergic rhinitis, cause unspecified   . Unspecified asthma(493.90)   . Personal history of other diseases of digestive system   . Personal history of urinary calculi   . Osteoporosis, unspecified   . DJD (degenerative joint disease)   . HTN (hypertension)   . Hyperlipidemia   . Anxiety state, unspecified   . Mitral valve regurgitation, mod. on TEE 2004, & mild to mod TR, EF 60%. 01/20/2013  . Pelvic fracture, recent history of 01/20/2013    s/p fall from a ladder  . Atrial fibrillation, permanent     Hx. of failed DCCV  . CHF (congestive heart failure)     Diastolic  . Moderate aortic stenosis 01/2013    EF60%  . Gallstones 04/08/13    vs biliary sludge on CT scan--no biliary obstruction  . Polycystic kidney disease 04/08/13    Benign appearing on u/s 03/23/13  . Nephrolithiasis 04/08/13    nonobstructive  . History of pelvic fracture summer 2014    MEDS:  Outpatient Prescriptions Prior to Visit  Medication Sig Dispense Refill  . acetaminophen (TYLENOL) 500 MG tablet Take 1,000 mg by mouth every 6 (six) hours as needed for pain.      Marland Kitchen albuterol (PROVENTIL HFA;VENTOLIN HFA) 108 (90 BASE) MCG/ACT inhaler Inhale 2 puffs into the lungs every 4 (four) hours as needed for wheezing or shortness of breath.      Marland Kitchen albuterol (PROVENTIL) (5 MG/ML) 0.5% nebulizer solution Take 0.5 mLs (2.5 mg total) by  nebulization 3 (three) times daily.  20 mL  12  . ALPRAZolam (XANAX) 0.25 MG tablet Take 1 tablet (0.25 mg total) by mouth at bedtime as needed for sleep.  90 tablet  0  . citalopram (CELEXA) 10 MG tablet Take 1 tablet (10 mg total) by mouth daily.  30 tablet  5  . furosemide (LASIX) 40 MG tablet Take 1 tablet (40 mg total) by mouth daily.  90 tablet  2  . guaiFENesin (MUCINEX) 600 MG 12 hr tablet Take 1 tablet (600 mg total) by mouth 2 (two) times daily.  15 tablet  0  . ipratropium (ATROVENT) 0.02 % nebulizer solution Take 2.5 mLs (0.5 mg total) by nebulization 3 (three) times daily.  75 mL  12  . metoprolol (LOPRESSOR) 50 MG tablet Take 75 mg by mouth 2 (two) times daily.      . potassium chloride SA (K-DUR,KLOR-CON) 20 MEQ tablet 2 tabs po qd  180 tablet  1  . ranitidine (ZANTAC) 150 MG tablet Take 150 mg by mouth daily as needed for heartburn (indigestion).      . sennosides-docusate sodium (SENOKOT-S) 8.6-50 MG tablet Take 1 tablet by mouth at bedtime as needed for constipation.      . verapamil (CALAN-SR) 240 MG CR  tablet Take 240 mg by mouth daily.      Marland Kitchen warfarin (COUMADIN) 2.5 MG tablet Take 1.25-2.5 mg by mouth daily. 0.5 tab on Sun, Tues, and Thurs; 1 tab all other days.      . cetirizine (ZYRTEC) 10 MG tablet Take 10 mg by mouth at bedtime.      . predniSONE (DELTASONE) 10 MG tablet 30 mg x2 days, 20mg  x 2 day, 10mg  x 2 days  12 tablet  0  . promethazine (PHENERGAN) 12.5 MG tablet Take 12.5-25 mg by mouth at bedtime as needed for nausea (dizziness).       No facility-administered medications prior to visit.    PE: Blood pressure 157/90, pulse 89, temperature 97.8 F (36.6 C), temperature source Temporal, resp. rate 28, height 5' 4.5" (1.638 m), weight 149 lb (67.586 kg), SpO2 92.00%. Gen: Alert, well appearing.  Patient is oriented to person, place, time, and situation. AFFECT: pleasant, lucid thought and speech. ENT: Ears: EACs clear, normal epithelium.  TMs with good light  reflex and landmarks bilaterally.  Eyes: no injection, icteris, swelling, or exudate.  EOMI, PERRLA. Nose: mild diffuse mucosal injection w/out focal lesion, bleeding, or edema.  Mild dried mucous in nares but no purulent d/c.  No injection or focal lesion.  Mouth: lips without lesion/swelling.  Oral mucosa pink and moist.    Oropharynx without erythema, exudate, or swelling.  Neck - No masses or thyromegaly or limitation in range of motion CV: irreg irreg, rate approx 90s, no m/r/g LUNGS: CTA bilat but diminished BS throughout all lung fields, with mild prolongation of exp phase.  With forced expiration I can hear a faint end exp wheeze.  RR about 55-60--shallow, nonlabored. EXT: 1+ bilat pitting edema (her baseline)  LAB: none  IMPRESSION AND PLAN:  Acute asthma exacerbation, viral URI. No sign of bacterial infection. No sign of acute/decompensated CHF.  Mild improvement in aeration with alb/atr neb in office today.  No resp distress. Prednisone taper rx'd: 40mg  qd x 4d, then 20mg  qd x 4d, then 10mg  qd x 4d. Continue albut 2.5mg  neb q4h prn at home.  An After Visit Summary was printed and given to the patient.  FOLLOW UP: 3d

## 2013-06-05 ENCOUNTER — Telehealth: Payer: Self-pay | Admitting: Cardiovascular Disease

## 2013-06-05 ENCOUNTER — Encounter: Payer: Self-pay | Admitting: Internal Medicine

## 2013-06-05 NOTE — Telephone Encounter (Signed)
Since she have been home from the hospital her primary doctor have put her on 2 rounds of Prednisone.Her weight have gone from 143 to 148.No ankle swelling,but does have anxiety.Plese call.

## 2013-06-05 NOTE — Assessment & Plan Note (Signed)
Intermittent use of zyrtec, especially in Spring

## 2013-06-05 NOTE — Assessment & Plan Note (Signed)
Declines flu vax- "makes me sick"- discussed Exacerbation required hospital. Now much improved  Plan - drop mucinex. Use neb just when needed

## 2013-06-05 NOTE — Telephone Encounter (Signed)
Returned call to [employer].  Stated she saw pt today and pt weight increased from 143 lbs to 145 lbs since being out of the hospital.  Stated Dr. Milinda Cave has put pt on 2 rounds of prednisone which has caused the weight gain.  Stated pt w/o swelling, lungs clear and O2 sats 96%.  Stated pt also has some anxiety about the weight gain and is concerned she will have to go back to the hospital and she told pt to take her Xanax to help calm her down.  Wanted to report this to Dr. Allyson Sabal.  Informed Dr. Allyson Sabal will be notified and if instructions given she will receive a call.  Verbalized understanding.  Message forwarded to Dr. Allyson Sabal.

## 2013-06-05 NOTE — Assessment & Plan Note (Signed)
Clinically in AFib, rate controlled

## 2013-06-06 ENCOUNTER — Encounter: Payer: Self-pay | Admitting: Family Medicine

## 2013-06-06 ENCOUNTER — Ambulatory Visit (INDEPENDENT_AMBULATORY_CARE_PROVIDER_SITE_OTHER): Payer: Medicare Other | Admitting: Family Medicine

## 2013-06-06 VITALS — BP 150/81 | HR 81 | Temp 98.5°F | Resp 18 | Ht 64.5 in | Wt 149.0 lb

## 2013-06-06 DIAGNOSIS — I10 Essential (primary) hypertension: Secondary | ICD-10-CM

## 2013-06-06 DIAGNOSIS — I509 Heart failure, unspecified: Secondary | ICD-10-CM

## 2013-06-06 DIAGNOSIS — Z23 Encounter for immunization: Secondary | ICD-10-CM

## 2013-06-06 DIAGNOSIS — I4821 Permanent atrial fibrillation: Secondary | ICD-10-CM

## 2013-06-06 DIAGNOSIS — I5032 Chronic diastolic (congestive) heart failure: Secondary | ICD-10-CM

## 2013-06-06 DIAGNOSIS — I4891 Unspecified atrial fibrillation: Secondary | ICD-10-CM

## 2013-06-06 MED ORDER — BUDESONIDE 0.5 MG/2ML IN SUSP: 0.5000 mg | Freq: Two times a day (BID) | RESPIRATORY_TRACT | Status: DC

## 2013-06-06 MED ORDER — DEXAMETHASONE 2 MG PO TABS: ORAL_TABLET | ORAL | Status: DC

## 2013-06-06 NOTE — Progress Notes (Signed)
OFFICE NOTE  06/06/2013  CC:  Chief Complaint  Patient presents with  . Follow-up    pt wants off prednisone due to weight gain     HPI: Patient is a 79 y.o. Caucasian female who is here for 3d f/u acute asthma flare with viral URI. Feeling better regarding cough and SOB.  Taking bronchodilator neb bid and has been compliant with prednisone up until today's dosing. She voices concern about fluid retention b/c according to her scales she is gaining wt since being on the med.   She denies seeing or feeling any excess fluid in extremities or abdomen.  No fevers, no chest pains, no presyncope/syncope. She says the prednisone has not made her appetite go up, says food/fluid intake is at baseline.  Pertinent PMH:  Past Medical History  Diagnosis Date  . Personal history of colonic polyps   . Allergic rhinitis, cause unspecified   . Unspecified asthma(493.90)   . Personal history of other diseases of digestive system   . Personal history of urinary calculi   . Osteoporosis, unspecified   . DJD (degenerative joint disease)   . HTN (hypertension)   . Hyperlipidemia   . Anxiety state, unspecified   . Mitral valve regurgitation, mod. on TEE 2004, & mild to mod TR, EF 60%. 01/20/2013  . Pelvic fracture, recent history of 01/20/2013    s/p fall from a ladder  . Atrial fibrillation, permanent     Hx. of failed DCCV  . CHF (congestive heart failure)     Diastolic  . Moderate aortic stenosis 01/2013    EF60%  . Gallstones 04/08/13    vs biliary sludge on CT scan--no biliary obstruction  . Polycystic kidney disease 04/08/13    Benign appearing on u/s 03/23/13  . Nephrolithiasis 04/08/13    nonobstructive  . History of pelvic fracture summer 2014   Past surgical, social, and family history reviewed and no changes noted since last office visit.  MEDS:  Outpatient Prescriptions Prior to Visit  Medication Sig Dispense Refill  . acetaminophen (TYLENOL) 500 MG tablet Take 1,000 mg by mouth  every 6 (six) hours as needed for pain.      Marland Kitchen albuterol (PROVENTIL HFA;VENTOLIN HFA) 108 (90 BASE) MCG/ACT inhaler Inhale 2 puffs into the lungs every 4 (four) hours as needed for wheezing or shortness of breath.      Marland Kitchen albuterol (PROVENTIL) (5 MG/ML) 0.5% nebulizer solution Take 0.5 mLs (2.5 mg total) by nebulization 3 (three) times daily.  20 mL  12  . ALPRAZolam (XANAX) 0.25 MG tablet Take 1 tablet (0.25 mg total) by mouth at bedtime as needed for sleep.  90 tablet  0  . cetirizine (ZYRTEC) 10 MG tablet Take 10 mg by mouth at bedtime.      . citalopram (CELEXA) 10 MG tablet Take 1 tablet (10 mg total) by mouth daily.  30 tablet  5  . furosemide (LASIX) 40 MG tablet Take 1 tablet (40 mg total) by mouth daily.  90 tablet  2  . guaiFENesin (MUCINEX) 600 MG 12 hr tablet Take 1 tablet (600 mg total) by mouth 2 (two) times daily.  15 tablet  0  . ipratropium (ATROVENT) 0.02 % nebulizer solution Take 2.5 mLs (0.5 mg total) by nebulization 3 (three) times daily.  75 mL  12  . metoprolol (LOPRESSOR) 50 MG tablet Take 75 mg by mouth 2 (two) times daily.      . potassium chloride SA (K-DUR,KLOR-CON) 20 MEQ tablet 2  tabs po qd  180 tablet  1  . predniSONE (DELTASONE) 20 MG tablet 2 tabs po qd x 4d, then 1 tab po qd x 4d, then 1/2 tab po qd x 4d  14 tablet  0  . ranitidine (ZANTAC) 150 MG tablet Take 150 mg by mouth daily as needed for heartburn (indigestion).      . sennosides-docusate sodium (SENOKOT-S) 8.6-50 MG tablet Take 1 tablet by mouth at bedtime as needed for constipation.      . verapamil (CALAN-SR) 240 MG CR tablet Take 240 mg by mouth daily.      Marland Kitchen warfarin (COUMADIN) 2.5 MG tablet Take 1.25-2.5 mg by mouth daily. 0.5 tab on Sun, Tues, and Thurs; 1 tab all other days.      . promethazine (PHENERGAN) 12.5 MG tablet Take 12.5-25 mg by mouth at bedtime as needed for nausea (dizziness).      . predniSONE (DELTASONE) 10 MG tablet 30 mg x2 days, 20mg  x 2 day, 10mg  x 2 days  12 tablet  0   No  facility-administered medications prior to visit.    PE: Blood pressure 150/81, pulse 81, temperature 98.5 F (36.9 C), temperature source Temporal, resp. rate 18, height 5' 4.5" (1.638 m), weight 149 lb (67.586 kg), SpO2 94.00%. RR is in the 50s at rest. Gen: Alert, well appearing.  Patient is oriented to person, place, time, and situation. Talks in 5-6 word sentences as is her normal. ENT: lips are pink CV: Irrig irreg, S1 and S 2 distant , without audible m/r/g, rate approx 80s LUNGS: CTA bilat but diffusely diminished BS and prolonged exp phase.  Breathing is fairly rapid as per her normal but it is nonlabored. EXT: no cyanosis or clubbing.  She has 1+ pitting edema bilat in pretibial/ankle/feet regions.   IMPRESSION AND PLAN:  1) Acute asthma flare, improving. Pt mentally resistant to prednisone, worried about fluid retention.  However, no sign of acute/decompensated diastolic CHF at this time. Her wt is stable on our scale compared to 3d/a. In the end, we did decide to change to a steroid with potentially less mineralocorticoid effect (dexamethasone 2mg , 1 tab po qd x 3d, then stop) and also start daily bid inhaled steroid---budesonide 0.5mg  bid--to be mixed with her twice daily bronchodilator treatments.  High dose flu vaccine given IM today.  FOLLOW UP: 2-3 wks.  Signs/symptoms to call or return for were reviewed and pt expressed understanding.

## 2013-06-07 ENCOUNTER — Encounter: Payer: Self-pay | Admitting: *Deleted

## 2013-06-10 ENCOUNTER — Telehealth: Payer: Self-pay | Admitting: Internal Medicine

## 2013-06-10 MED ORDER — DOXYCYCLINE HYCLATE 100 MG PO TABS: ORAL_TABLET | ORAL | Status: DC

## 2013-06-10 NOTE — Telephone Encounter (Signed)
Pt aware that she will receive a call as soon as CY responds.

## 2013-06-10 NOTE — Telephone Encounter (Signed)
Offer doxycycline 100 mgf, # 8, 2 today then one daily, Also Mucinex

## 2013-06-10 NOTE — Telephone Encounter (Signed)
Pt's daughter states she can be reached at [phone].  Pt states that pt is coughing so bad that she can hardly catch her breath.  Would like to know if pt will be able to get an appt.  Please advise.  Antionette Fairy

## 2013-06-10 NOTE — Telephone Encounter (Signed)
rx sent and pt is aware. Joanna Salas, CMA  

## 2013-06-10 NOTE — Telephone Encounter (Signed)
Spoke with pt-- Pt c/o increased cough w/ thick green mucus and chest congestion--worse at night x 2 weeks.  Pt reports using nebs regularly.  Pt reports seeing PCP--not given any abx--told to take OTC Mucinex.   Requesting appt today w/ CY.  Allergies  Allergen Reactions  . Pneumococcal Vaccines   . Atorvastatin Other (See Comments)    Severe Leg Cramps  . Ciprofloxacin Hcl Nausea And Vomiting  . Morphine Sulfate Nausea Only  . Penicillins Nausea And Vomiting  . Prednisone Other (See Comments)    Weight gain and md said shouldn't take with afib meds  . Sotalol Hcl Nausea And Vomiting  . Sulfonamide Derivatives Nausea Only   [facility]  Please advise Dr Maple Hudson if any appts available . Thanks.

## 2013-06-12 NOTE — Telephone Encounter (Signed)
Noted  

## 2013-06-18 ENCOUNTER — Telehealth: Payer: Self-pay | Admitting: Internal Medicine

## 2013-06-18 ENCOUNTER — Telehealth: Payer: Self-pay | Admitting: Cardiovascular Disease

## 2013-06-18 NOTE — Telephone Encounter (Signed)
Will forward to CDY to make him aware  FYI

## 2013-06-18 NOTE — Telephone Encounter (Signed)
Daughter/pt called requesting to come in for appt now.  Pt c/o SOB increasing since yesterday.  C/o 2lb weight gain since yesterday 146 lbs to 148 lbs.  Informed provider will be notified.  Franky Macho, PA-C notified and will call pt.  Message forwarded to Hinda Glatter, PA-C for further instructions.

## 2013-06-18 NOTE — Telephone Encounter (Signed)
Noted  

## 2013-06-18 NOTE — Telephone Encounter (Signed)
Call to Sue/pt and appt scheduled for 11.7.14 at 11:20am.   Fannie Knee stated f/u appt w/ [facility] will be rescheduled.

## 2013-06-18 NOTE — Telephone Encounter (Signed)
I spoke with the patient and her daughter. She does not want to go to the hospital. Some increased SOB and edema X 24 hrs. I suggested she increase her Lasix to 40 mg BID and come to the clinic Friday.

## 2013-06-18 NOTE — Telephone Encounter (Signed)
Having SOB and feet swelling. Does she need to bring her in.

## 2013-06-20 ENCOUNTER — Ambulatory Visit (INDEPENDENT_AMBULATORY_CARE_PROVIDER_SITE_OTHER): Payer: Medicare Other | Admitting: Physician Assistant

## 2013-06-20 ENCOUNTER — Ambulatory Visit: Payer: Medicare Other | Admitting: Family Medicine

## 2013-06-20 ENCOUNTER — Encounter: Payer: Self-pay | Admitting: Physician Assistant

## 2013-06-20 VITALS — BP 100/60 | HR 58 | Ht 66.0 in | Wt 146.0 lb

## 2013-06-20 DIAGNOSIS — I5033 Acute on chronic diastolic (congestive) heart failure: Secondary | ICD-10-CM

## 2013-06-20 DIAGNOSIS — I4891 Unspecified atrial fibrillation: Secondary | ICD-10-CM

## 2013-06-20 DIAGNOSIS — R0609 Other forms of dyspnea: Secondary | ICD-10-CM

## 2013-06-20 DIAGNOSIS — R0600 Dyspnea, unspecified: Secondary | ICD-10-CM

## 2013-06-20 NOTE — Progress Notes (Signed)
Date:  06/20/2013   ID:  Joanna Salas, DOB 01-21-1925, MRN [id number]  PCP:  Joanna Massed, MD  Primary Cardiologist:  Joanna Salas     History of Present Illness: Joanna Salas is a 79 y.o. female mildly overweight, recently widowed (husband of 40 years died a month ago) Caucasian female, mother of 1 living child who saw Dr. Allyson Salas on May 16, 2013 . She has a history of hypertension, chronic A-fib rate controlled on Coumadin anticoagulation. She recently lost  her husband.   She does have normal LV systolic function with mild to moderate aortic stenosis, and aortic valve area of 1.2 cm.   She was recently hospitalized(05/12/13) for respiratory distress secondary to COPD, asthmatic exacerbation and diastolic dysfunction with a BNP in the 4000 range.  She was given bronchodilators, a steroid taper and IV diuretics.  She presents today with SOB over the last two days.  Her ambulatory O2 sats are 93-94% here in the office.  She reports the LEE improves overnight and this morning she had no swelling.  2 pillow orthopnea.  Joanna Salas increased her lasix to BID which the patient did for one day.  She had increase UOP and decrease in weight.  The patient currently denies nausea, vomiting, fever, chest pain, dizziness, PND, cough, congestion, abdominal pain, hematochezia, melena.  She is clearly still mourning the loss of her husband and continues to take celexa prescribed at an earlier encounter.  Wt Readings from Last 3 Encounters:  06/20/13 146 lb (66.225 kg)  06/06/13 149 lb (67.586 kg)  06/03/13 149 lb (67.586 kg)     Past Medical History  Diagnosis Date  . Personal history of colonic polyps   . Allergic rhinitis, cause unspecified   . Unspecified asthma(493.90)   . Personal history of other diseases of digestive system   . Personal history of urinary calculi   . Osteoporosis, unspecified   . DJD (degenerative joint disease)   . HTN (hypertension)   . Hyperlipidemia   . Anxiety state,  unspecified   . Mitral valve regurgitation, mod. on TEE 2004, & mild to mod TR, EF 60%. 01/20/2013  . Pelvic fracture, recent history of 01/20/2013    s/p fall from a ladder  . Atrial fibrillation, permanent     Hx. of failed DCCV  . CHF (congestive heart failure)     Diastolic  . Moderate aortic stenosis 01/2013    EF60%  . Gallstones 04/08/13    vs biliary sludge on CT scan--no biliary obstruction  . Polycystic kidney disease 04/08/13    Benign appearing on u/s 03/23/13  . Nephrolithiasis 04/08/13    nonobstructive  . History of pelvic fracture summer 2014  . Enteritis due to Clostridium difficile 05/13/2013    Current Outpatient Prescriptions  Medication Sig Dispense Refill  . acetaminophen (TYLENOL) 500 MG tablet Take 1,000 mg by mouth every 6 (six) hours as needed for pain.      Marland Kitchen albuterol (PROVENTIL HFA;VENTOLIN HFA) 108 (90 BASE) MCG/ACT inhaler Inhale 2 puffs into the lungs every 4 (four) hours as needed for wheezing or shortness of breath.      Marland Kitchen albuterol (PROVENTIL) (5 MG/ML) 0.5% nebulizer solution Take 0.5 mLs (2.5 mg total) by nebulization 3 (three) times daily.  20 mL  12  . ALPRAZolam (XANAX) 0.25 MG tablet Take 1 tablet (0.25 mg total) by mouth at bedtime as needed for sleep.  90 tablet  0  . budesonide (PULMICORT) 0.5 MG/2ML nebulizer solution  Take 2 mLs (0.5 mg total) by nebulization 2 (two) times daily.  120 mL  3  . cetirizine (ZYRTEC) 10 MG tablet Take 10 mg by mouth at bedtime.      . citalopram (CELEXA) 10 MG tablet Take 1 tablet (10 mg total) by mouth daily.  30 tablet  5  . furosemide (LASIX) 40 MG tablet Take 1 tablet (40 mg total) by mouth daily.  90 tablet  2  . guaiFENesin (MUCINEX) 600 MG 12 hr tablet Take 1 tablet (600 mg total) by mouth 2 (two) times daily.  15 tablet  0  . ipratropium (ATROVENT) 0.02 % nebulizer solution Take 2.5 mLs (0.5 mg total) by nebulization 3 (three) times daily.  75 mL  12  . metoprolol (LOPRESSOR) 50 MG tablet Take 75 mg by mouth 2  (two) times daily.      . potassium chloride SA (K-DUR,KLOR-CON) 20 MEQ tablet 2 tabs po qd  180 tablet  1  . promethazine (PHENERGAN) 12.5 MG tablet Take 12.5-25 mg by mouth at bedtime as needed for nausea (dizziness).      . ranitidine (ZANTAC) 150 MG tablet Take 150 mg by mouth daily as needed for heartburn (indigestion).      . sennosides-docusate sodium (SENOKOT-S) 8.6-50 MG tablet Take 1 tablet by mouth at bedtime as needed for constipation.      . verapamil (CALAN-SR) 240 MG CR tablet Take 240 mg by mouth daily.      Marland Kitchen warfarin (COUMADIN) 2.5 MG tablet Take 1.25-2.5 mg by mouth daily. 0.5 tab on Sun, Tues, and Thurs; 1 tab all other days.      Marland Kitchen dexamethasone (DECADRON) 2 MG tablet 1 tab po once daily with a meal x 3 days  3 tablet  0  . doxycycline (VIBRA-TABS) 100 MG tablet Take 2 today then one daily until gone  8 tablet  0   No current facility-administered medications for this visit.    Allergies:    Allergies  Allergen Reactions  . Pneumococcal Vaccines   . Atorvastatin Other (See Comments)    Severe Leg Cramps  . Ciprofloxacin Hcl Nausea And Vomiting  . Morphine Sulfate Nausea Only  . Penicillins Nausea And Vomiting  . Prednisone Other (See Comments)    Weight gain and md said shouldn't take with afib meds  . Sotalol Hcl Nausea And Vomiting  . Sulfonamide Derivatives Nausea Only    Social History:  The patient  reports that she has never smoked. She has never used smokeless tobacco. She reports that she does not drink alcohol or use illicit drugs.   Family history:   Family History  Problem Relation Age of Onset  . Heart disease Mother   . Heart disease Father     ROS:  Please see the history of present illness.  All other systems reviewed and negative.   PHYSICAL EXAM: VS:  BP 100/60  Pulse 58  Ht 5\' 6"  (1.676 m)  Wt 146 lb (66.225 kg)  BMI 23.58 kg/m2 Well nourished, well developed, in no acute distress HEENT: Pupils are equal round react to light  accommodation extraocular movements are intact.  Neck: + JVDNo cervical lymphadenopathy. Cardiac: Irrg  rate and rhythm without murmurs rubs or gallops. Lungs:  Decreased BS bilaterally.  No apparent rales or wheeze. no rhonchi  Abd: soft, nontender, no distention positive bowel sounds all quadrants, no hepatosplenomegaly Ext: 2+ right lower extremity edema mostly in the ankle, 1+ on the left.  2+ radial  and dorsalis pedis pulses. Skin: warm and dry Neuro:  Grossly normal  EKG:    Afib with controlled rate. 68 BPM  ASSESSMENT AND PLAN:  Problem List Items Addressed This Visit   Acute on chronic diastolic heart failure     The patient's weight has decreased since the 26th, however, JVD is elevated, sounds wet when listening to respirations without a stethoscope and has orhtopnea.  LEE seems to improve overnight and worsens during the day, R>L.  She likely has some degree of venous insufficiency.  i have asked her to wear her compression socks.  I will also increase her lasix to 40mg  BID for the next three days.  Follow up in two weeks or sooner if not improving.  BNP on the way out.     RESOLVED: A-fib   Relevant Orders      EKG 12-Lead    Other Visit Diagnoses   Dyspnea    -  Primary    Relevant Orders       B Nat Peptide

## 2013-06-20 NOTE — Assessment & Plan Note (Signed)
The patient's weight has decreased since the 26th, however, JVD is elevated, sounds wet when listening to respirations without a stethoscope and has orhtopnea.  LEE seems to improve overnight and worsens during the day, R>L.  She likely has some degree of venous insufficiency.  i have asked her to wear her compression socks.  I will also increase her lasix to 40mg  BID for the next three days.  Follow up in two weeks or sooner if not improving.  BNP on the way out.

## 2013-06-20 NOTE — Patient Instructions (Signed)
1.  Increase lasix to 40mg  BID for today, Saturday and Sunday.  If breathing gets better, see me back in two weeks.  If breathing gets worse, call immediately.

## 2013-06-21 LAB — BRAIN NATRIURETIC PEPTIDE: Brain Natriuretic Peptide: 303.4 pg/mL — ABNORMAL HIGH (ref 0.0–100.0)

## 2013-06-23 ENCOUNTER — Encounter: Payer: Self-pay | Admitting: Family Medicine

## 2013-06-23 ENCOUNTER — Ambulatory Visit (INDEPENDENT_AMBULATORY_CARE_PROVIDER_SITE_OTHER): Payer: Medicare Other | Admitting: Family Medicine

## 2013-06-23 VITALS — BP 126/79 | HR 72 | Temp 98.9°F | Resp 20 | Ht 64.5 in | Wt 147.0 lb

## 2013-06-23 DIAGNOSIS — I5032 Chronic diastolic (congestive) heart failure: Secondary | ICD-10-CM

## 2013-06-23 DIAGNOSIS — I4821 Permanent atrial fibrillation: Secondary | ICD-10-CM

## 2013-06-23 DIAGNOSIS — I509 Heart failure, unspecified: Secondary | ICD-10-CM

## 2013-06-23 DIAGNOSIS — J449 Chronic obstructive pulmonary disease, unspecified: Secondary | ICD-10-CM

## 2013-06-23 DIAGNOSIS — I4891 Unspecified atrial fibrillation: Secondary | ICD-10-CM

## 2013-06-23 DIAGNOSIS — F329 Major depressive disorder, single episode, unspecified: Secondary | ICD-10-CM

## 2013-06-23 MED ORDER — ALPRAZOLAM 0.25 MG PO TABS: 0.2500 mg | ORAL_TABLET | Freq: Every evening | ORAL | Status: DC | PRN

## 2013-06-23 MED ORDER — CITALOPRAM HYDROBROMIDE 10 MG PO TABS: ORAL_TABLET | ORAL | Status: DC

## 2013-06-23 NOTE — Progress Notes (Signed)
OFFICE NOTE  06/23/2013  CC:  Chief Complaint  Patient presents with  . Follow-up     HPI: Patient is a 79 y.o. Caucasian female who is here for 2 wk f/u obstructive pulm dz and CHF with chronic diastolic CHF.  She saw her cardiologist 11/07 for a few days of increased SOB. Her wt was slightly down but she appeared to have JVD and lungs sounded wet so her lasix was increased to 40mg  bid for the last 3d.   She reports she also called her pulmonologist with c/o cough not long after she finished the steroids I gave her about 2 wks ago.  She apparently was rx'd a 4d course of doxycycline.  She reports that she feels much better.  No cough.  She feels like her breathing is at baseline (no SOB at rest for the most part, but with minimal activity she gets SOB).  No CP, no dizziness, no racing heart/palpitations.  Urinating fine, having BMs fine. She feels like she is sedated too much after taking citalopram in the morning.  Admits it doesn't happen every morning but says her mood has been more stable and she no longer gets weepy when her husband is mentioned or when people want to talk to her about him.  Appetite is good.  Pertinent PMH:  Past Medical History  Diagnosis Date  . Personal history of colonic polyps   . Allergic rhinitis, cause unspecified   . Unspecified asthma(493.90)   . Personal history of other diseases of digestive system   . Personal history of urinary calculi   . Osteoporosis, unspecified   . DJD (degenerative joint disease)   . HTN (hypertension)   . Hyperlipidemia   . Anxiety state, unspecified   . Mitral valve regurgitation, mod. on TEE 2004, & mild to mod TR, EF 60%. 01/20/2013  . Pelvic fracture, recent history of 01/20/2013    s/p fall from a ladder--recovered uneventfully  . Atrial fibrillation, permanent     Hx. of failed DCCV  . CHF (congestive heart failure)     Diastolic  . Moderate aortic stenosis 01/2013    EF60%  . Gallstones 04/08/13    vs biliary  sludge on CT scan--no biliary obstruction  . Polycystic kidney disease 04/08/13    Benign appearing on u/s 03/23/13  . Nephrolithiasis 04/08/13    nonobstructive  . History of pelvic fracture summer 2014  . Enteritis due to Clostridium difficile 05/13/2013   Past surgical, social, and family history reviewed and no changes noted since last office visit.  MEDS:  Outpatient Prescriptions Prior to Visit  Medication Sig Dispense Refill  . acetaminophen (TYLENOL) 500 MG tablet Take 1,000 mg by mouth every 6 (six) hours as needed for pain.      Marland Kitchen albuterol (PROVENTIL HFA;VENTOLIN HFA) 108 (90 BASE) MCG/ACT inhaler Inhale 2 puffs into the lungs every 4 (four) hours as needed for wheezing or shortness of breath.      Marland Kitchen albuterol (PROVENTIL) (5 MG/ML) 0.5% nebulizer solution Take 0.5 mLs (2.5 mg total) by nebulization 3 (three) times daily.  20 mL  12  . budesonide (PULMICORT) 0.5 MG/2ML nebulizer solution Take 2 mLs (0.5 mg total) by nebulization 2 (two) times daily.  120 mL  3  . furosemide (LASIX) 40 MG tablet Take 1 tablet (40 mg total) by mouth daily.  90 tablet  2  . ipratropium (ATROVENT) 0.02 % nebulizer solution Take 2.5 mLs (0.5 mg total) by nebulization 3 (three) times daily.  75 mL  12  . metoprolol (LOPRESSOR) 50 MG tablet Take 75 mg by mouth 2 (two) times daily.      . potassium chloride SA (K-DUR,KLOR-CON) 20 MEQ tablet 2 tabs po qd  180 tablet  1  . verapamil (CALAN-SR) 240 MG CR tablet Take 240 mg by mouth daily.      Marland Kitchen warfarin (COUMADIN) 2.5 MG tablet Take 1.25-2.5 mg by mouth daily. 0.5 tab on Sun, Tues, and Thurs; 1 tab all other days.      . ALPRAZolam (XANAX) 0.25 MG tablet Take 1 tablet (0.25 mg total) by mouth at bedtime as needed for sleep.  90 tablet  0  . citalopram (CELEXA) 10 MG tablet Take 1 tablet (10 mg total) by mouth daily.  30 tablet  5  . cetirizine (ZYRTEC) 10 MG tablet Take 10 mg by mouth at bedtime.      Marland Kitchen dexamethasone (DECADRON) 2 MG tablet 1 tab po once daily  with a meal x 3 days  3 tablet  0  . guaiFENesin (MUCINEX) 600 MG 12 hr tablet Take 1 tablet (600 mg total) by mouth 2 (two) times daily.  15 tablet  0  . promethazine (PHENERGAN) 12.5 MG tablet Take 12.5-25 mg by mouth at bedtime as needed for nausea (dizziness).      . ranitidine (ZANTAC) 150 MG tablet Take 150 mg by mouth daily as needed for heartburn (indigestion).      . sennosides-docusate sodium (SENOKOT-S) 8.6-50 MG tablet Take 1 tablet by mouth at bedtime as needed for constipation.      Marland Kitchen doxycycline (VIBRA-TABS) 100 MG tablet Take 2 today then one daily until gone  8 tablet  0   No facility-administered medications prior to visit.    PE: Blood pressure 126/79, pulse 72, temperature 98.9 F (37.2 C), temperature source Temporal, resp. rate 20, height 5' 4.5" (1.638 m), weight 147 lb (66.679 kg), SpO2 91.00%. Gen: Alert, well appearing.  Patient is oriented to person, place, time, and situation. AFFECT: pleasant, lucid thought and speech. Mouth: mucosa moist/pink LUNGS: diffusely diminished breath sounds, rate about 50s, nonlabored.  I hear no crackles or wheezes.  Exp phase not prolonged. CV: Irreg Irreg, distant S1 and S2, soft systolic murmur appreciated.  No rub or gallop heard. EXT: 1-2+ pitting edema in pretibial regions down into dorsum of feet bilat.  No tenderness.  IMPRESSION AND PLAN:  1) Diastolic CHF: stable.   She is going to resume lasix 40mg  qd dosing and keep f/u with her cardiologist.  2) A fib, rate controlled and anticoagulated.  Stable.  3) COPD: stable, much improved since recent steroids and antibiotics.  4) Depression/prolonged grief rxn: she feels like she is improved and with question of oversedation from her citalopram we decided to decrease her dose to 1/2 of the 10mg  tab qAm. I did RF her alprazolam 0.25mg , which she says she takes an avg of 3 times per week for help with sleep.  She declined zostavax today.  FOLLOW UP: 2 mo

## 2013-06-25 ENCOUNTER — Encounter (HOSPITAL_BASED_OUTPATIENT_CLINIC_OR_DEPARTMENT_OTHER): Payer: Self-pay | Admitting: Emergency Medicine

## 2013-06-25 ENCOUNTER — Inpatient Hospital Stay (HOSPITAL_BASED_OUTPATIENT_CLINIC_OR_DEPARTMENT_OTHER)
Admission: EM | Admit: 2013-06-25 | Discharge: 2013-07-14 | DRG: 291 | Disposition: E | Payer: Medicare Other | Attending: Internal Medicine | Admitting: Internal Medicine

## 2013-06-25 DIAGNOSIS — J189 Pneumonia, unspecified organism: Secondary | ICD-10-CM | POA: Diagnosis present

## 2013-06-25 DIAGNOSIS — Z7901 Long term (current) use of anticoagulants: Secondary | ICD-10-CM

## 2013-06-25 DIAGNOSIS — N189 Chronic kidney disease, unspecified: Secondary | ICD-10-CM | POA: Diagnosis present

## 2013-06-25 DIAGNOSIS — I469 Cardiac arrest, cause unspecified: Secondary | ICD-10-CM | POA: Diagnosis not present

## 2013-06-25 DIAGNOSIS — I4891 Unspecified atrial fibrillation: Secondary | ICD-10-CM | POA: Diagnosis present

## 2013-06-25 DIAGNOSIS — I509 Heart failure, unspecified: Secondary | ICD-10-CM | POA: Diagnosis present

## 2013-06-25 DIAGNOSIS — I5033 Acute on chronic diastolic (congestive) heart failure: Principal | ICD-10-CM | POA: Diagnosis present

## 2013-06-25 DIAGNOSIS — M810 Age-related osteoporosis without current pathological fracture: Secondary | ICD-10-CM | POA: Diagnosis present

## 2013-06-25 DIAGNOSIS — J4489 Other specified chronic obstructive pulmonary disease: Secondary | ICD-10-CM | POA: Diagnosis present

## 2013-06-25 DIAGNOSIS — I359 Nonrheumatic aortic valve disorder, unspecified: Secondary | ICD-10-CM | POA: Diagnosis present

## 2013-06-25 DIAGNOSIS — J90 Pleural effusion, not elsewhere classified: Secondary | ICD-10-CM | POA: Diagnosis present

## 2013-06-25 DIAGNOSIS — I059 Rheumatic mitral valve disease, unspecified: Secondary | ICD-10-CM | POA: Diagnosis present

## 2013-06-25 DIAGNOSIS — R112 Nausea with vomiting, unspecified: Secondary | ICD-10-CM | POA: Diagnosis present

## 2013-06-25 DIAGNOSIS — Z79899 Other long term (current) drug therapy: Secondary | ICD-10-CM

## 2013-06-25 DIAGNOSIS — I498 Other specified cardiac arrhythmias: Secondary | ICD-10-CM | POA: Diagnosis present

## 2013-06-25 DIAGNOSIS — I4821 Permanent atrial fibrillation: Secondary | ICD-10-CM

## 2013-06-25 DIAGNOSIS — J9600 Acute respiratory failure, unspecified whether with hypoxia or hypercapnia: Secondary | ICD-10-CM | POA: Diagnosis present

## 2013-06-25 DIAGNOSIS — Q613 Polycystic kidney, unspecified: Secondary | ICD-10-CM | POA: Diagnosis present

## 2013-06-25 DIAGNOSIS — E785 Hyperlipidemia, unspecified: Secondary | ICD-10-CM | POA: Diagnosis present

## 2013-06-25 DIAGNOSIS — M1990 Unspecified osteoarthritis, unspecified site: Secondary | ICD-10-CM | POA: Diagnosis present

## 2013-06-25 DIAGNOSIS — E860 Dehydration: Secondary | ICD-10-CM | POA: Diagnosis present

## 2013-06-25 DIAGNOSIS — I129 Hypertensive chronic kidney disease with stage 1 through stage 4 chronic kidney disease, or unspecified chronic kidney disease: Secondary | ICD-10-CM | POA: Diagnosis present

## 2013-06-25 DIAGNOSIS — N390 Urinary tract infection, site not specified: Secondary | ICD-10-CM | POA: Diagnosis present

## 2013-06-25 DIAGNOSIS — J449 Chronic obstructive pulmonary disease, unspecified: Secondary | ICD-10-CM

## 2013-06-25 DIAGNOSIS — N179 Acute kidney failure, unspecified: Secondary | ICD-10-CM

## 2013-06-25 LAB — COMPREHENSIVE METABOLIC PANEL
ALT: 19 U/L (ref 0–35)
AST: 39 U/L — ABNORMAL HIGH (ref 0–37)
Alkaline Phosphatase: 85 U/L (ref 39–117)
CO2: 33 mEq/L — ABNORMAL HIGH (ref 19–32)
Chloride: 99 mEq/L (ref 96–112)
GFR calc Af Amer: 26 mL/min — ABNORMAL LOW (ref >90)
GFR calc non Af Amer: 23 mL/min — ABNORMAL LOW (ref >90)
Glucose, Bld: 122 mg/dL — ABNORMAL HIGH (ref 70–99)
Potassium: 5 mEq/L (ref 3.5–5.1)
Sodium: 141 mEq/L (ref 135–145)
Total Bilirubin: 0.8 mg/dL (ref 0.3–1.2)

## 2013-06-25 LAB — CBC WITH DIFFERENTIAL/PLATELET
Basophils Absolute: 0 10*3/uL (ref 0.0–0.1)
Eosinophils Relative: 0 % (ref 0–5)
Lymphocytes Relative: 19 % (ref 12–46)
Lymphs Abs: 1.5 10*3/uL (ref 0.7–4.0)
MCHC: 29.2 g/dL — ABNORMAL LOW (ref 30.0–36.0)
MCV: 86.1 fL (ref 78.0–100.0)
Monocytes Relative: 9 % (ref 3–12)
Neutro Abs: 5.6 10*3/uL (ref 1.7–7.7)
Platelets: 155 10*3/uL (ref 150–400)
RBC: 4.61 MIL/uL (ref 3.87–5.11)
RDW: 18.7 % — ABNORMAL HIGH (ref 11.5–15.5)
WBC: 7.8 10*3/uL (ref 4.0–10.5)

## 2013-06-25 MED ORDER — SODIUM CHLORIDE 0.9 % IV BOLUS (SEPSIS)
500.0000 mL | Freq: Once | INTRAVENOUS | Status: AC
  Administered 2013-06-25: 500 mL via INTRAVENOUS

## 2013-06-25 MED ORDER — ONDANSETRON HCL 4 MG/2ML IJ SOLN
4.0000 mg | Freq: Once | INTRAMUSCULAR | Status: AC
  Administered 2013-06-25: 4 mg via INTRAVENOUS
  Filled 2013-06-25: qty 2

## 2013-06-25 NOTE — ED Notes (Signed)
MD at bedside. 

## 2013-06-25 NOTE — ED Notes (Signed)
I removed nail polish per nurse and took pulse ox again. Patient still showed no better than 83%, I placed patient on 2 ltrs. nasal canula with no change. I increased 02 to 5 ltrs by nasal canula and got best reading of 89. I  notified nurse and respiratory therapist of same. RT Maralyn Sago checked and told me patient had cold hands causing low saturation reading.

## 2013-06-25 NOTE — ED Notes (Signed)
Pt c/o nausea " all day" vomiting x 2 hrs, pt denies pain

## 2013-06-25 NOTE — ED Provider Notes (Signed)
CSN: [id number]     Arrival date & time 06/20/2013  2232 History  This chart was scribed for Ennio Houp Smitty Cords, MD by Bennett Scrape, ED Scribe. This patient was seen in room MH04/MH04 and the patient's care was started at 11:14 PM.   Chief Complaint  Patient presents with  . Emesis    Patient is a 79 y.o. female presenting with vomiting. The history is provided by the patient. No language interpreter was used.  Emesis Duration:  3 hours Number of daily episodes:  3-4 Quality:  Stomach contents Progression:  Unchanged Chronicity:  New Recent urination:  Absent (today) Context: not post-tussive   Ineffective treatments:  None tried Associated symptoms: no abdominal pain, no cough, no diarrhea and no fever   Risk factors: no suspect food intake     HPI Comments: OLA RAAP is a 79 y.o. female who presents to the Emergency Department complaining of persistent emesis that started within the past 3 hours with associated nausea that has been present "all day". She reports 3 to 4 episodes since onset. She denies any recent sick contacts or travels. She denies any recent fast food or other take out dinners. She reports two softer than normal BMs but denies any diarrhea. She denies any abdominal pain, constipation, dysuria or fevers. She has a h/o that is significant for HTN, A. Fib and CHF.  Past Medical History  Diagnosis Date  . Personal history of colonic polyps   . Allergic rhinitis, cause unspecified   . Unspecified asthma(493.90)   . Personal history of other diseases of digestive system   . Personal history of urinary calculi   . Osteoporosis, unspecified   . DJD (degenerative joint disease)   . HTN (hypertension)   . Hyperlipidemia   . Anxiety state, unspecified   . Mitral valve regurgitation, mod. on TEE 2004, & mild to mod TR, EF 60%. 01/20/2013  . Pelvic fracture, recent history of 01/20/2013    s/p fall from a ladder--recovered uneventfully  . Atrial fibrillation,  permanent     Hx. of failed DCCV  . CHF (congestive heart failure)     Diastolic  . Moderate aortic stenosis 01/2013    EF60%  . Gallstones 04/08/13    vs biliary sludge on CT scan--no biliary obstruction  . Polycystic kidney disease 04/08/13    Benign appearing on u/s 03/23/13  . Nephrolithiasis 04/08/13    nonobstructive  . History of pelvic fracture summer 2014  . Enteritis due to Clostridium difficile 05/13/2013  . Depression 02/10/2013   Past Surgical History  Procedure Laterality Date  . Appendectomy    . Tonsillectomy    . Hemicolectomy      right   Family History  Problem Relation Age of Onset  . Heart disease Mother   . Heart disease Father    History  Substance Use Topics  . Smoking status: Never Smoker   . Smokeless tobacco: Never Used  . Alcohol Use: No   No OB history provided.  Review of Systems  Constitutional: Negative for fever.  Respiratory: Positive for shortness of breath.   Cardiovascular: Negative for chest pain.  Gastrointestinal: Positive for nausea and vomiting. Negative for abdominal pain, diarrhea and constipation.  Genitourinary: Negative for dysuria.  All other systems reviewed and are negative.    Allergies  Pneumococcal vaccines; Atorvastatin; Ciprofloxacin hcl; Morphine sulfate; Penicillins; Prednisone; Sotalol hcl; and Sulfonamide derivatives  Home Medications   Current Outpatient Rx  Name  Route  Sig  Dispense  Refill  . acetaminophen (TYLENOL) 500 MG tablet   Oral   Take 1,000 mg by mouth every 6 (six) hours as needed for pain.         Marland Kitchen albuterol (PROVENTIL HFA;VENTOLIN HFA) 108 (90 BASE) MCG/ACT inhaler   Inhalation   Inhale 2 puffs into the lungs every 4 (four) hours as needed for wheezing or shortness of breath.         Marland Kitchen albuterol (PROVENTIL) (5 MG/ML) 0.5% nebulizer solution   Nebulization   Take 0.5 mLs (2.5 mg total) by nebulization 3 (three) times daily.   20 mL   12   . ALPRAZolam (XANAX) 0.25 MG tablet    Oral   Take 1 tablet (0.25 mg total) by mouth at bedtime as needed for sleep.   90 tablet   0   . budesonide (PULMICORT) 0.5 MG/2ML nebulizer solution   Nebulization   Take 2 mLs (0.5 mg total) by nebulization 2 (two) times daily.   120 mL   3   . cetirizine (ZYRTEC) 10 MG tablet   Oral   Take 10 mg by mouth at bedtime.         . citalopram (CELEXA) 10 MG tablet      Take 1/2 tab po qAM   30 tablet   5   . dexamethasone (DECADRON) 2 MG tablet      1 tab po once daily with a meal x 3 days   3 tablet   0   . furosemide (LASIX) 40 MG tablet   Oral   Take 1 tablet (40 mg total) by mouth daily.   90 tablet   2   . guaiFENesin (MUCINEX) 600 MG 12 hr tablet   Oral   Take 1 tablet (600 mg total) by mouth 2 (two) times daily.   15 tablet   0   . ipratropium (ATROVENT) 0.02 % nebulizer solution   Nebulization   Take 2.5 mLs (0.5 mg total) by nebulization 3 (three) times daily.   75 mL   12   . metoprolol (LOPRESSOR) 50 MG tablet   Oral   Take 75 mg by mouth 2 (two) times daily.         . potassium chloride SA (K-DUR,KLOR-CON) 20 MEQ tablet      2 tabs po qd   180 tablet   1   . promethazine (PHENERGAN) 12.5 MG tablet   Oral   Take 12.5-25 mg by mouth at bedtime as needed for nausea (dizziness).         . ranitidine (ZANTAC) 150 MG tablet   Oral   Take 150 mg by mouth daily as needed for heartburn (indigestion).         . sennosides-docusate sodium (SENOKOT-S) 8.6-50 MG tablet   Oral   Take 1 tablet by mouth at bedtime as needed for constipation.         . verapamil (CALAN-SR) 240 MG CR tablet   Oral   Take 240 mg by mouth daily.         Marland Kitchen warfarin (COUMADIN) 2.5 MG tablet   Oral   Take 1.25-2.5 mg by mouth daily. 0.5 tab on Sun, Tues, and Thurs; 1 tab all other days.          Triage Vitals: BP 116/56  Pulse 69  Temp(Src) 98.1 F (36.7 C) (Oral)  Resp 16  Ht 5\' 6"  (1.676 m)  Wt 146 lb (66.225 kg)  BMI 23.58 kg/m2  Physical Exam   Nursing note and vitals reviewed. Constitutional: She is oriented to person, place, and time. She appears well-developed and well-nourished. No distress.  HENT:  Head: Normocephalic and atraumatic.  Moist MM  Eyes: Conjunctivae and EOM are normal. Pupils are equal, round, and reactive to light.  Neck: Neck supple. No tracheal deviation present.  Cardiovascular: Normal rate, normal heart sounds and intact distal pulses.  An irregular rhythm present.  Pulmonary/Chest: Effort normal and breath sounds normal. No respiratory distress.  Abdominal: Soft. There is no tenderness.  Increased bowel sounds in the LLQ  Musculoskeletal: Normal range of motion.  Neurological: She is alert and oriented to person, place, and time. She has normal reflexes.  Skin: Skin is warm and dry.  Psychiatric: She has a normal mood and affect. Her behavior is normal.    ED Course  Procedures (including critical care time)  Medications  sodium chloride 0.9 % bolus 500 mL (500 mLs Intravenous New Bag/Given 07/09/2013 2320)  ondansetron (ZOFRAN) injection 4 mg (4 mg Intravenous Given 06/21/2013 2320)    DIAGNOSTIC STUDIES: None performed.   COORDINATION OF CARE: 11:16 PM-Discussed treatment plan which includes medications, CBC panel, CMP and UA with pt at bedside and pt agreed to plan.   Labs Review Labs Reviewed - No data to display Imaging Review No results found.  EKG Interpretation   None       MDM  No diagnosis found.  Date: 06/26/2013  Rate: 95  Rhythm: atrial fibrillation  QRS Axis: left  Intervals: normal  ST/T Wave abnormalities: normal  Conduction Disutrbances:none  Narrative Interpretation:   Old EKG Reviewed: none available   I personally performed the services described in this documentation, which was scribed in my presence. The recorded information has been reviewed and is accurate.    Jasmine Awe, MD 06/26/13 [phone]

## 2013-06-26 ENCOUNTER — Encounter (HOSPITAL_COMMUNITY): Payer: Self-pay | Admitting: Internal Medicine

## 2013-06-26 ENCOUNTER — Emergency Department (HOSPITAL_BASED_OUTPATIENT_CLINIC_OR_DEPARTMENT_OTHER): Payer: Medicare Other

## 2013-06-26 ENCOUNTER — Inpatient Hospital Stay (HOSPITAL_COMMUNITY): Payer: Medicare Other

## 2013-06-26 ENCOUNTER — Ambulatory Visit: Payer: Medicare Other | Admitting: Pharmacist Clinician (PhC)/ Clinical Pharmacy Specialist

## 2013-06-26 DIAGNOSIS — J189 Pneumonia, unspecified organism: Secondary | ICD-10-CM

## 2013-06-26 DIAGNOSIS — I4891 Unspecified atrial fibrillation: Secondary | ICD-10-CM

## 2013-06-26 DIAGNOSIS — R112 Nausea with vomiting, unspecified: Secondary | ICD-10-CM | POA: Diagnosis present

## 2013-06-26 DIAGNOSIS — N179 Acute kidney failure, unspecified: Secondary | ICD-10-CM

## 2013-06-26 DIAGNOSIS — I509 Heart failure, unspecified: Secondary | ICD-10-CM | POA: Diagnosis present

## 2013-06-26 DIAGNOSIS — Z5181 Encounter for therapeutic drug level monitoring: Secondary | ICD-10-CM

## 2013-06-26 DIAGNOSIS — J449 Chronic obstructive pulmonary disease, unspecified: Secondary | ICD-10-CM

## 2013-06-26 DIAGNOSIS — Z7901 Long term (current) use of anticoagulants: Secondary | ICD-10-CM

## 2013-06-26 DIAGNOSIS — J9600 Acute respiratory failure, unspecified whether with hypoxia or hypercapnia: Secondary | ICD-10-CM

## 2013-06-26 DIAGNOSIS — J90 Pleural effusion, not elsewhere classified: Secondary | ICD-10-CM | POA: Diagnosis present

## 2013-06-26 DIAGNOSIS — I5033 Acute on chronic diastolic (congestive) heart failure: Principal | ICD-10-CM

## 2013-06-26 LAB — CBC WITH DIFFERENTIAL/PLATELET
Basophils Absolute: 0 10*3/uL (ref 0.0–0.1)
Basophils Relative: 0 % (ref 0–1)
Eosinophils Absolute: 0 10*3/uL (ref 0.0–0.7)
HCT: 39.2 % (ref 36.0–46.0)
MCH: 25.1 pg — ABNORMAL LOW (ref 26.0–34.0)
MCHC: 29.1 g/dL — ABNORMAL LOW (ref 30.0–36.0)
Monocytes Relative: 9 % (ref 3–12)
Neutro Abs: 4 10*3/uL (ref 1.7–7.7)
Neutrophils Relative %: 68 % (ref 43–77)
Platelets: 137 10*3/uL — ABNORMAL LOW (ref 150–400)
RDW: 18 % — ABNORMAL HIGH (ref 11.5–15.5)
WBC: 5.9 10*3/uL (ref 4.0–10.5)

## 2013-06-26 LAB — URINE MICROSCOPIC-ADD ON

## 2013-06-26 LAB — HEPATIC FUNCTION PANEL
AST: 36 U/L (ref 0–37)
Albumin: 3.3 g/dL — ABNORMAL LOW (ref 3.5–5.2)
Indirect Bilirubin: 0.4 mg/dL (ref 0.3–0.9)
Total Bilirubin: 0.6 mg/dL (ref 0.3–1.2)

## 2013-06-26 LAB — URINALYSIS, ROUTINE W REFLEX MICROSCOPIC
Glucose, UA: NEGATIVE mg/dL
Ketones, ur: NEGATIVE mg/dL
Nitrite: NEGATIVE
Protein, ur: 100 mg/dL — AB
Specific Gravity, Urine: 1.024 (ref 1.005–1.030)
Urobilinogen, UA: 1 mg/dL (ref 0.0–1.0)

## 2013-06-26 LAB — CREATININE, URINE, RANDOM: Creatinine, Urine: 223.42 mg/dL

## 2013-06-26 LAB — BASIC METABOLIC PANEL
BUN: 26 mg/dL — ABNORMAL HIGH (ref 6–23)
Calcium: 9.3 mg/dL (ref 8.4–10.5)
Creatinine, Ser: 1.84 mg/dL — ABNORMAL HIGH (ref 0.50–1.10)
GFR calc Af Amer: 27 mL/min — ABNORMAL LOW (ref >90)
GFR calc non Af Amer: 24 mL/min — ABNORMAL LOW (ref >90)
Glucose, Bld: 110 mg/dL — ABNORMAL HIGH (ref 70–99)
Sodium: 140 mEq/L (ref 135–145)

## 2013-06-26 LAB — LACTIC ACID, PLASMA: Lactic Acid, Venous: 0.8 mmol/L (ref 0.5–2.2)

## 2013-06-26 LAB — PROCALCITONIN: Procalcitonin: <0.1 ng/mL

## 2013-06-26 LAB — PROTIME-INR: Prothrombin Time: 29.6 seconds — ABNORMAL HIGH (ref 11.6–15.2)

## 2013-06-26 LAB — MRSA PCR SCREENING: MRSA by PCR: NEGATIVE

## 2013-06-26 LAB — PRO B NATRIURETIC PEPTIDE: Pro B Natriuretic peptide (BNP): 7044 pg/mL — ABNORMAL HIGH (ref 0–450)

## 2013-06-26 LAB — SODIUM, URINE, RANDOM: Sodium, Ur: 17 mEq/L

## 2013-06-26 MED ORDER — ONDANSETRON HCL 4 MG PO TABS
4.0000 mg | ORAL_TABLET | Freq: Four times a day (QID) | ORAL | Status: DC | PRN
  Administered 2013-06-27: 4 mg via ORAL
  Filled 2013-06-26: qty 1

## 2013-06-26 MED ORDER — WARFARIN 1.25 MG HALF TABLET
1.2500 mg | ORAL_TABLET | ORAL | Status: DC
  Administered 2013-06-26: 1.25 mg via ORAL
  Filled 2013-06-26: qty 1

## 2013-06-26 MED ORDER — SENNA-DOCUSATE SODIUM 8.6-50 MG PO TABS: 1.0000 | ORAL_TABLET | Freq: Every evening | ORAL | Status: DC | PRN

## 2013-06-26 MED ORDER — ACETAMINOPHEN 650 MG RE SUPP: 650.0000 mg | Freq: Four times a day (QID) | RECTAL | Status: DC | PRN

## 2013-06-26 MED ORDER — SODIUM CHLORIDE 0.9 % IJ SOLN
3.0000 mL | Freq: Two times a day (BID) | INTRAMUSCULAR | Status: DC
  Administered 2013-06-27: 3 mL via INTRAVENOUS

## 2013-06-26 MED ORDER — VERAPAMIL HCL ER 240 MG PO TBCR
240.0000 mg | EXTENDED_RELEASE_TABLET | Freq: Every day | ORAL | Status: DC
  Administered 2013-06-26 – 2013-06-27 (×2): 240 mg via ORAL
  Filled 2013-06-26 (×2): qty 1

## 2013-06-26 MED ORDER — ALPRAZOLAM 0.25 MG PO TABS
0.2500 mg | ORAL_TABLET | Freq: Every evening | ORAL | Status: DC | PRN
  Administered 2013-06-27: 0.25 mg via ORAL
  Filled 2013-06-26: qty 1

## 2013-06-26 MED ORDER — FAMOTIDINE 20 MG PO TABS
20.0000 mg | ORAL_TABLET | Freq: Every day | ORAL | Status: DC
  Administered 2013-06-26 – 2013-06-27 (×2): 20 mg via ORAL
  Filled 2013-06-26 (×2): qty 1

## 2013-06-26 MED ORDER — POTASSIUM CHLORIDE CRYS ER 20 MEQ PO TBCR
20.0000 meq | EXTENDED_RELEASE_TABLET | Freq: Every day | ORAL | Status: DC
  Administered 2013-06-26 – 2013-06-27 (×2): 20 meq via ORAL
  Filled 2013-06-26 (×2): qty 1

## 2013-06-26 MED ORDER — WARFARIN SODIUM 2.5 MG PO TABS
2.5000 mg | ORAL_TABLET | ORAL | Status: DC
  Filled 2013-06-26: qty 1

## 2013-06-26 MED ORDER — WARFARIN - PHARMACIST DOSING INPATIENT
Freq: Every day | Status: DC
  Administered 2013-06-26: 18:00:00

## 2013-06-26 MED ORDER — CITALOPRAM HYDROBROMIDE 10 MG PO TABS
10.0000 mg | ORAL_TABLET | Freq: Every day | ORAL | Status: DC
  Administered 2013-06-26 – 2013-06-27 (×2): 10 mg via ORAL
  Filled 2013-06-26 (×2): qty 1

## 2013-06-26 MED ORDER — LORATADINE 10 MG PO TABS
10.0000 mg | ORAL_TABLET | Freq: Every day | ORAL | Status: DC
  Administered 2013-06-26 – 2013-06-27 (×2): 10 mg via ORAL
  Filled 2013-06-26 (×2): qty 1

## 2013-06-26 MED ORDER — ALBUTEROL SULFATE HFA 108 (90 BASE) MCG/ACT IN AERS
2.0000 | INHALATION_SPRAY | RESPIRATORY_TRACT | Status: DC | PRN
  Filled 2013-06-26: qty 6.7

## 2013-06-26 MED ORDER — CEFEPIME HCL 1 G IJ SOLR
INTRAMUSCULAR | Status: AC
  Administered 2013-06-26: 1000 mg
  Filled 2013-06-26: qty 1

## 2013-06-26 MED ORDER — ACETAMINOPHEN 325 MG PO TABS
650.0000 mg | ORAL_TABLET | Freq: Four times a day (QID) | ORAL | Status: DC | PRN
  Administered 2013-06-26: 650 mg via ORAL
  Filled 2013-06-26: qty 2

## 2013-06-26 MED ORDER — SODIUM CHLORIDE 0.9 % IJ SOLN
3.0000 mL | Freq: Two times a day (BID) | INTRAMUSCULAR | Status: DC
  Administered 2013-06-26 – 2013-06-27 (×3): 3 mL via INTRAVENOUS

## 2013-06-26 MED ORDER — BUDESONIDE 0.5 MG/2ML IN SUSP
0.5000 mg | Freq: Two times a day (BID) | RESPIRATORY_TRACT | Status: DC
  Administered 2013-06-26 – 2013-06-27 (×3): 0.5 mg via RESPIRATORY_TRACT
  Filled 2013-06-26 (×5): qty 2

## 2013-06-26 MED ORDER — IPRATROPIUM BROMIDE 0.02 % IN SOLN
0.5000 mg | Freq: Three times a day (TID) | RESPIRATORY_TRACT | Status: DC
  Administered 2013-06-26 – 2013-06-27 (×4): 0.5 mg via RESPIRATORY_TRACT
  Filled 2013-06-26 (×5): qty 2.5

## 2013-06-26 MED ORDER — ALBUTEROL SULFATE (5 MG/ML) 0.5% IN NEBU
2.5000 mg | INHALATION_SOLUTION | Freq: Three times a day (TID) | RESPIRATORY_TRACT | Status: DC
  Administered 2013-06-26 – 2013-06-27 (×4): 2.5 mg via RESPIRATORY_TRACT
  Filled 2013-06-26 (×5): qty 0.5

## 2013-06-26 MED ORDER — METOPROLOL TARTRATE 50 MG PO TABS
75.0000 mg | ORAL_TABLET | Freq: Two times a day (BID) | ORAL | Status: DC
  Administered 2013-06-26 – 2013-06-27 (×3): 75 mg via ORAL
  Filled 2013-06-26 (×4): qty 1

## 2013-06-26 MED ORDER — ONDANSETRON HCL 4 MG/2ML IJ SOLN: 4.0000 mg | Freq: Four times a day (QID) | INTRAMUSCULAR | Status: DC | PRN

## 2013-06-26 MED ORDER — GUAIFENESIN ER 600 MG PO TB12
600.0000 mg | ORAL_TABLET | Freq: Two times a day (BID) | ORAL | Status: DC
  Administered 2013-06-26 – 2013-06-27 (×3): 600 mg via ORAL
  Filled 2013-06-26 (×4): qty 1

## 2013-06-26 MED ORDER — FUROSEMIDE 10 MG/ML IJ SOLN: 40.0000 mg | Freq: Two times a day (BID) | INTRAMUSCULAR | Status: DC

## 2013-06-26 MED ORDER — DEXTROSE 5 % IV SOLN
1.0000 g | INTRAVENOUS | Status: DC
  Administered 2013-06-27: 1 g via INTRAVENOUS
  Filled 2013-06-26 (×2): qty 1

## 2013-06-26 MED ORDER — VANCOMYCIN HCL IN DEXTROSE 1-5 GM/200ML-% IV SOLN
1000.0000 mg | Freq: Once | INTRAVENOUS | Status: AC
  Administered 2013-06-26: 1000 mg via INTRAVENOUS
  Filled 2013-06-26: qty 200

## 2013-06-26 NOTE — Progress Notes (Addendum)
[facility] Progress Note [place] TEAM 1 - Stepdown/ICU TEAM   OLENE GODFREY ZOX:[id number] DOB: Dec 06, 1924 DOA: 07-17-13 PCP: Jeoffrey Massed, MD  Brief narrative: 79 y/o female admitted from home for dyspnea and vomiting. Apparently has been short of breath for 2 days. Her daughter who is present states pt was sick with a lung infection a couple of weeks ago and took 4-5 days of antibiotics and appeared to improve. Pt has not been febrile lately. She has a cough but is not productive.   Assessment/Plan: Principal Problem:   Acute respiratory failure - infectious vs CHF- obtain Procalcitonin now - diuretics held this AM due to renal failure - obtain CT chest without contrast now - cont current antibiotics  Active Problems:   Pleural effusions - ECHO (6/9) reveals normal EF and mild LVH, also significant for mild-mod aortic stenosis and mod TR - repeat ECHO -holding lasix for now    Nausea with vomiting - resolved    Acute renal failure - appears dehydrated with elevated sp gravity of urine and hyaline casts despite effusions in lungs- holding Lasix as mentioned above  UTI - culture sent on urine  A-fib - rate controlled currently - therapeutic INR on Coumadin    Code Status: full Family Communication: d/w daughter Disposition Plan: follow in SDU  Consultants: none  Procedures: none  Antibiotics: Vanc/ Cefepime 11/12  DVT prophylaxis: Coumadin  HPI/Subjective: Pt states she feels better today in regards to her breathing- has a cough but no sputum. No chest pain.    Objective: Blood pressure 129/76, pulse 108, temperature 97.3 F (36.3 C), temperature source Oral, resp. rate 31, height 5\' 6"  (1.676 m), weight 67.5 kg (148 lb 13 oz), SpO2 97.00%.  Intake/Output Summary (Last 24 hours) at 06/26/13 1606 Last data filed at 06/26/13 1100  Gross per 24 hour  Intake    360 ml  Output      0 ml  Net    360 ml     Exam: General:AAO x 3,  sitting in a chair,  No acute respiratory distress Lungs: crackles in b/l Bases Cardiovascular: Irregular rate and rhythm without murmur gallop or rub normal S1 and S2 Abdomen: Nontender, nondistended, soft, bowel sounds positive, no rebound, no ascites, no appreciable mass Extremities: No significant cyanosis, clubbing, or edema bilateral lower extremities  Data Reviewed: Basic Metabolic Panel:  Recent Labs Lab 07/17/13 2255 06/26/13 0917  NA 141 140  K 5.0 4.9  CL 99 102  CO2 33* 33*  GLUCOSE 122* 110*  BUN 25* 26*  CREATININE 1.90* 1.84*  CALCIUM 9.7 9.3   Liver Function Tests:  Recent Labs Lab 07-17-2013 2255 06/26/13 0917  AST 39* 36  ALT 19 17  ALKPHOS 85 77  BILITOT 0.8 0.6  PROT 6.6 6.2  ALBUMIN 3.5 3.3*   No results found for this basename: LIPASE, AMYLASE,  in the last 168 hours No results found for this basename: AMMONIA,  in the last 168 hours CBC:  Recent Labs Lab 07-17-2013 2255 06/26/13 0917  WBC 7.8 5.9  NEUTROABS 5.6 4.0  HGB 11.6* 11.4*  HCT 39.7 39.2  MCV 86.1 86.2  PLT 155 137*   Cardiac Enzymes:  Recent Labs Lab 07/17/13 2255  TROPONINI <0.30   BNP (last 3 results)  Recent Labs  04/07/13 2210 05/12/13 1830 Jul 17, 2013 2255  PROBNP 10489.0* 4282.0* 7044.0*   CBG: No results found for this basename: GLUCAP,  in the last 168 hours  Recent Results (from  the past 240 hour(s))  MRSA PCR SCREENING     Status: None   Collection Time    06/26/13  4:12 AM      Result Value Range Status   MRSA by PCR NEGATIVE  NEGATIVE Final   Comment:            The GeneXpert MRSA Assay (FDA     approved for NASAL specimens     only), is one component of a     comprehensive MRSA colonization     surveillance program. It is not     intended to diagnose MRSA     infection nor to guide or     monitor treatment for     MRSA infections.     Studies:  Recent x-ray studies have been reviewed in detail by the Attending Physician  Scheduled  Meds:  Scheduled Meds: . albuterol  2.5 mg Nebulization TID  . budesonide  0.5 mg Nebulization BID  . ceFEPime (MAXIPIME) IV  1 g Intravenous Q24H  . citalopram  10 mg Oral Daily  . famotidine  20 mg Oral Daily  . guaiFENesin  600 mg Oral BID  . ipratropium  0.5 mg Nebulization TID  . loratadine  10 mg Oral Daily  . metoprolol  75 mg Oral BID  . potassium chloride SA  20 mEq Oral Daily  . sodium chloride  3 mL Intravenous Q12H  . sodium chloride  3 mL Intravenous Q12H  . verapamil  240 mg Oral Daily  . warfarin  1.25 mg Oral Custom  . [START ON 06/21/2013] warfarin  2.5 mg Oral Q M,W,F,Sa-1800  . Warfarin - Pharmacist Dosing Inpatient   Does not apply q1800   Continuous Infusions:   Time spent on care of this patient: 35 min  Calvert Cantor, MD  [facility] Office  [phone] Pager - Text Page per Loretha Stapler as per below:  On-Call/Text Page:      [email or web address]      password [email or web address]  If 7PM-7AM, please contact night-coverage [email or web address] Password [email or web address] 06/26/2013, 4:06 PM   LOS: 1 day

## 2013-06-26 NOTE — Progress Notes (Signed)
ANTICOAGULATION CONSULT NOTE - Initial Consult  Pharmacy Consult for Coumadin Indication: atrial fibrillation  Allergies  Allergen Reactions  . Pneumococcal Vaccines   . Atorvastatin Other (See Comments)    Severe Leg Cramps  . Ciprofloxacin Hcl Nausea And Vomiting  . Morphine Sulfate Nausea Only  . Penicillins Nausea And Vomiting  . Prednisone Other (See Comments)    Weight gain and md said shouldn't take with afib meds  . Sotalol Hcl Nausea And Vomiting  . Sulfonamide Derivatives Nausea Only    Patient Measurements: Height: 5\' 6"  (167.6 cm) Weight: 148 lb 13 oz (67.5 kg) IBW/kg (Calculated) : 59.3  Vital Signs: Temp: 98.2 F (36.8 C) (11/13 0415) Temp src: Oral (11/13 0415) BP: 129/76 mmHg (11/13 0415) Pulse Rate: 105 (11/13 0650)  Labs:  Recent Labs  07/09/13 2255  HGB 11.6*  HCT 39.7  PLT 155  LABPROT 29.6*  INR 2.94*  CREATININE 1.90*  TROPONINI <0.30    Estimated Creatinine Clearance: 19.9 ml/min (by C-G formula based on Cr of 1.9).   Medical History: Past Medical History  Diagnosis Date  . Personal history of colonic polyps   . Allergic rhinitis, cause unspecified   . Unspecified asthma(493.90)   . Personal history of other diseases of digestive system   . Personal history of urinary calculi   . Osteoporosis, unspecified   . DJD (degenerative joint disease)   . HTN (hypertension)   . Hyperlipidemia   . Anxiety state, unspecified   . Mitral valve regurgitation, mod. on TEE 2004, & mild to mod TR, EF 60%. 01/20/2013  . Pelvic fracture, recent history of 01/20/2013    s/p fall from a ladder--recovered uneventfully  . Atrial fibrillation, permanent     Hx. of failed DCCV  . CHF (congestive heart failure)     Diastolic  . Moderate aortic stenosis 01/2013    EF60%  . Gallstones 04/08/13    vs biliary sludge on CT scan--no biliary obstruction  . Polycystic kidney disease 04/08/13    Benign appearing on u/s 03/23/13  . Nephrolithiasis 04/08/13   nonobstructive  . History of pelvic fracture summer 2014  . Enteritis due to Clostridium difficile 05/13/2013  . Depression 02/10/2013    Medications:  Prescriptions prior to admission  Medication Sig Dispense Refill  . albuterol (PROVENTIL HFA;VENTOLIN HFA) 108 (90 BASE) MCG/ACT inhaler Inhale 2 puffs into the lungs every 4 (four) hours as needed for wheezing or shortness of breath.      Marland Kitchen albuterol (PROVENTIL) (5 MG/ML) 0.5% nebulizer solution Take 0.5 mLs (2.5 mg total) by nebulization 3 (three) times daily.  20 mL  12  . ALPRAZolam (XANAX) 0.25 MG tablet Take 1 tablet (0.25 mg total) by mouth at bedtime as needed for sleep.  90 tablet  0  . budesonide (PULMICORT) 0.5 MG/2ML nebulizer solution Take 2 mLs (0.5 mg total) by nebulization 2 (two) times daily.  120 mL  3  . cetirizine (ZYRTEC) 10 MG tablet Take 10 mg by mouth at bedtime.      . furosemide (LASIX) 40 MG tablet Take 1 tablet (40 mg total) by mouth daily.  90 tablet  2  . guaiFENesin (MUCINEX) 600 MG 12 hr tablet Take 1 tablet (600 mg total) by mouth 2 (two) times daily.  15 tablet  0  . ipratropium (ATROVENT) 0.02 % nebulizer solution Take 2.5 mLs (0.5 mg total) by nebulization 3 (three) times daily.  75 mL  12  . metoprolol (LOPRESSOR) 50 MG tablet Take  75 mg by mouth 2 (two) times daily.      . potassium chloride SA (K-DUR,KLOR-CON) 20 MEQ tablet 2 tabs po qd  180 tablet  1  . promethazine (PHENERGAN) 12.5 MG tablet Take 12.5-25 mg by mouth at bedtime as needed for nausea (dizziness).      . ranitidine (ZANTAC) 150 MG tablet Take 150 mg by mouth daily as needed for heartburn (indigestion).      . verapamil (CALAN-SR) 240 MG CR tablet Take 240 mg by mouth daily.      Marland Kitchen warfarin (COUMADIN) 2.5 MG tablet Take 1.25-2.5 mg by mouth daily. 0.5 tab on Sun, Tues, and Thurs; 1 tab all other days.      Marland Kitchen acetaminophen (TYLENOL) 500 MG tablet Take 1,000 mg by mouth every 6 (six) hours as needed for pain.      . citalopram (CELEXA) 10 MG  tablet Take 1/2 tab po qAM  30 tablet  5  . dexamethasone (DECADRON) 2 MG tablet 1 tab po once daily with a meal x 3 days  3 tablet  0  . sennosides-docusate sodium (SENOKOT-S) 8.6-50 MG tablet Take 1 tablet by mouth at bedtime as needed for constipation.       Scheduled:  . albuterol  2.5 mg Nebulization TID  . budesonide  0.5 mg Nebulization BID  . ceFEPime (MAXIPIME) IV  1 g Intravenous Q24H  . citalopram  10 mg Oral Daily  . famotidine  20 mg Oral Daily  . furosemide  40 mg Intravenous Q12H  . guaiFENesin  600 mg Oral BID  . ipratropium  0.5 mg Nebulization TID  . loratadine  10 mg Oral Daily  . metoprolol  75 mg Oral BID  . potassium chloride SA  20 mEq Oral Daily  . sodium chloride  3 mL Intravenous Q12H  . sodium chloride  3 mL Intravenous Q12H  . verapamil  240 mg Oral Daily    Assessment: 79yo female c/o N/V x1d, admitted for pleural effusion and possible PNA, to continue Coumadin for Afib; admitted with therapeutic INR.  Goal of Therapy:  INR 2-3   Plan:  Will continue home Coumadin dose of 2.5mg  MWFSa and 1.25mg  TTSu and monitor INR.  Vernard Gambles, PharmD, BCPS  06/26/2013,7:20 AM

## 2013-06-26 NOTE — Progress Notes (Signed)
@  [id number] Dr. Toniann Fail of [facility] endorsed he would be coming up to write orders for pt.

## 2013-06-26 NOTE — ED Notes (Signed)
rn unable to take report will call to get report

## 2013-06-26 NOTE — Progress Notes (Signed)
ANTIBIOTIC CONSULT NOTE - INITIAL  Pharmacy Consult for Cefepime Indication: pneumonia  Allergies  Allergen Reactions  . Pneumococcal Vaccines   . Atorvastatin Other (See Comments)    Severe Leg Cramps  . Ciprofloxacin Hcl Nausea And Vomiting  . Morphine Sulfate Nausea Only  . Penicillins Nausea And Vomiting  . Prednisone Other (See Comments)    Weight gain and md said shouldn't take with afib meds  . Sotalol Hcl Nausea And Vomiting  . Sulfonamide Derivatives Nausea Only    Patient Measurements: Height: 5\' 6"  (167.6 cm) Weight: 146 lb (66.225 kg) IBW/kg (Calculated) : 59.3  Vital Signs: Temp: 98.1 F (36.7 C) (11/12 2237) Temp src: Oral (11/12 2237) BP: 120/68 mmHg (11/13 0104) Pulse Rate: 97 (11/13 0104)  Labs:  Recent Labs  07/09/2013 2255  WBC 7.8  HGB 11.6*  PLT 155  CREATININE 1.90*   Estimated Creatinine Clearance: 19.9 ml/min (by C-G formula based on Cr of 1.9). No results found for this basename: VANCOTROUGH, VANCOPEAK, VANCORANDOM, GENTTROUGH, GENTPEAK, GENTRANDOM, TOBRATROUGH, TOBRAPEAK, TOBRARND, AMIKACINPEAK, AMIKACINTROU, AMIKACIN,  in the last 72 hours   Medical History: Past Medical History  Diagnosis Date  . Personal history of colonic polyps   . Allergic rhinitis, cause unspecified   . Unspecified asthma(493.90)   . Personal history of other diseases of digestive system   . Personal history of urinary calculi   . Osteoporosis, unspecified   . DJD (degenerative joint disease)   . HTN (hypertension)   . Hyperlipidemia   . Anxiety state, unspecified   . Mitral valve regurgitation, mod. on TEE 2004, & mild to mod TR, EF 60%. 01/20/2013  . Pelvic fracture, recent history of 01/20/2013    s/p fall from a ladder--recovered uneventfully  . Atrial fibrillation, permanent     Hx. of failed DCCV  . CHF (congestive heart failure)     Diastolic  . Moderate aortic stenosis 01/2013    EF60%  . Gallstones 04/08/13    vs biliary sludge on CT scan--no biliary  obstruction  . Polycystic kidney disease 04/08/13    Benign appearing on u/s 03/23/13  . Nephrolithiasis 04/08/13    nonobstructive  . History of pelvic fracture summer 2014  . Enteritis due to Clostridium difficile 05/13/2013  . Depression 02/10/2013   Assessment: [name] to start cefepime per pharmacy for possible PNA. Labs as above (CrCl ~20), also getting vancomycin x 1 at [facility] ED, to transfer to [facility] soon.   Goal of Therapy:  Clinical resolution   Plan:  -Cefepime 1g IV q24h -F/U additional scheduled vancomycin if needed -Trend WBC, temp, renal function  -F/U cultures   Thank you for allowing me to take part in this patient's care,  Abran Duke, PharmD Clinical Pharmacist Phone: [phone] Pager: [phone] 06/26/2013 2:06 AM

## 2013-06-26 NOTE — H&P (Signed)
[facility] History and Physical  Joanna Salas LKG:[id number] DOB: 10-Mar-1925 DOA: 06/24/2013  Referring physician: ER physician. PCP: Jeoffrey Massed, MD  Specialists: Dr. Nanetta Batty. Cardiologist.  Chief Complaint: Shortness of breath and nausea vomiting.  HPI: Joanna Salas is a 79 y.o. female with known history of diastolic CHF last EF measured was 55-60% with moderate aortic stenosis, atrial fibrillation on Coumadin, COPD, hypertension was brought to the ER after patient had shortness of breath with nausea and vomiting. Patient states that she's been having shortness of breath last 2 days with no chest pain or productive cough. Denies any fever chills. In addition patient has been having multiple episodes of nausea vomiting. Chest x-ray in the ER showed worsening right pleural effusion. On exam patient's abdomen appears benign. Patient has a history of C. difficile recently but denies any diarrhea. In addition patient's labs show acute renal failure. Patient has been admitted for further management.   Review of Systems: As presented in the history of presenting illness, rest negative.  Past Medical History  Diagnosis Date  . Personal history of colonic polyps   . Allergic rhinitis, cause unspecified   . Unspecified asthma(493.90)   . Personal history of other diseases of digestive system   . Personal history of urinary calculi   . Osteoporosis, unspecified   . DJD (degenerative joint disease)   . HTN (hypertension)   . Hyperlipidemia   . Anxiety state, unspecified   . Mitral valve regurgitation, mod. on TEE 2004, & mild to mod TR, EF 60%. 01/20/2013  . Pelvic fracture, recent history of 01/20/2013    s/p fall from a ladder--recovered uneventfully  . Atrial fibrillation, permanent     Hx. of failed DCCV  . CHF (congestive heart failure)     Diastolic  . Moderate aortic stenosis 01/2013    EF60%  . Gallstones 04/08/13    vs biliary sludge on CT scan--no biliary  obstruction  . Polycystic kidney disease 04/08/13    Benign appearing on u/s 03/23/13  . Nephrolithiasis 04/08/13    nonobstructive  . History of pelvic fracture summer 2014  . Enteritis due to Clostridium difficile 05/13/2013  . Depression 02/10/2013   Past Surgical History  Procedure Laterality Date  . Appendectomy    . Tonsillectomy    . Hemicolectomy      right   Social History:  reports that she has never smoked. She has never used smokeless tobacco. She reports that she does not drink alcohol or use illicit drugs. Where does patient live home. Can patient participate in ADLs? Yes.  Allergies  Allergen Reactions  . Pneumococcal Vaccines   . Atorvastatin Other (See Comments)    Severe Leg Cramps  . Ciprofloxacin Hcl Nausea And Vomiting  . Morphine Sulfate Nausea Only  . Penicillins Nausea And Vomiting  . Prednisone Other (See Comments)    Weight gain and md said shouldn't take with afib meds  . Sotalol Hcl Nausea And Vomiting  . Sulfonamide Derivatives Nausea Only    Family History:  Family History  Problem Relation Age of Onset  . Heart disease Mother   . Heart disease Father       Prior to Admission medications   Medication Sig Start Date End Date Taking? Authorizing Provider  albuterol (PROVENTIL HFA;VENTOLIN HFA) 108 (90 BASE) MCG/ACT inhaler Inhale 2 puffs into the lungs every 4 (four) hours as needed for wheezing or shortness of breath.   Yes Historical Provider, MD  albuterol (PROVENTIL) (5  MG/ML) 0.5% nebulizer solution Take 0.5 mLs (2.5 mg total) by nebulization 3 (three) times daily. 05/15/13  Yes Joseph Art, DO  ALPRAZolam (XANAX) 0.25 MG tablet Take 1 tablet (0.25 mg total) by mouth at bedtime as needed for sleep. 06/23/13  Yes Jeoffrey Massed, MD  budesonide (PULMICORT) 0.5 MG/2ML nebulizer solution Take 2 mLs (0.5 mg total) by nebulization 2 (two) times daily. 06/06/13  Yes Jeoffrey Massed, MD  cetirizine (ZYRTEC) 10 MG tablet Take 10 mg by mouth at  bedtime.   Yes Historical Provider, MD  furosemide (LASIX) 40 MG tablet Take 1 tablet (40 mg total) by mouth daily. 03/20/13  Yes Luke K Kilroy, PA-C  guaiFENesin (MUCINEX) 600 MG 12 hr tablet Take 1 tablet (600 mg total) by mouth 2 (two) times daily. 05/15/13  Yes Jessica U Vann, DO  ipratropium (ATROVENT) 0.02 % nebulizer solution Take 2.5 mLs (0.5 mg total) by nebulization 3 (three) times daily. 05/15/13  Yes Joseph Art, DO  metoprolol (LOPRESSOR) 50 MG tablet Take 75 mg by mouth 2 (two) times daily.   Yes Historical Provider, MD  potassium chloride SA (K-DUR,KLOR-CON) 20 MEQ tablet 2 tabs po qd 04/17/13  Yes Jeoffrey Massed, MD  promethazine (PHENERGAN) 12.5 MG tablet Take 12.5-25 mg by mouth at bedtime as needed for nausea (dizziness).   Yes Historical Provider, MD  ranitidine (ZANTAC) 150 MG tablet Take 150 mg by mouth daily as needed for heartburn (indigestion).   Yes Historical Provider, MD  verapamil (CALAN-SR) 240 MG CR tablet Take 240 mg by mouth daily.   Yes Historical Provider, MD  warfarin (COUMADIN) 2.5 MG tablet Take 1.25-2.5 mg by mouth daily. 0.5 tab on Sun, Tues, and Thurs; 1 tab all other days.   Yes Historical Provider, MD  acetaminophen (TYLENOL) 500 MG tablet Take 1,000 mg by mouth every 6 (six) hours as needed for pain.    Historical Provider, MD  citalopram (CELEXA) 10 MG tablet Take 1/2 tab po qAM 06/23/13   Jeoffrey Massed, MD  dexamethasone (DECADRON) 2 MG tablet 1 tab po once daily with a meal x 3 days 06/06/13   Jeoffrey Massed, MD  sennosides-docusate sodium (SENOKOT-S) 8.6-50 MG tablet Take 1 tablet by mouth at bedtime as needed for constipation.    Historical Provider, MD    Physical Exam: Filed Vitals:   06/26/13 0104 06/26/13 0254 06/26/13 0415 06/26/13 0650  BP: 120/68 119/66 129/76   Pulse: 97 94 85 105  Temp:  99 F (37.2 C) 98.2 F (36.8 C)   TempSrc:  Oral Oral   Resp: 18 28 24 24   Height:   5\' 6"  (1.676 m)   Weight:   67.5 kg (148 lb 13 oz)    SpO2: 97% 91% 96% 94%     General:  Well-developed and nourished.  Eyes: Anicteric no pallor.  ENT: No discharge from ears eyes nose mouth.  Neck: No mass felt.  Cardiovascular: S1-S2 heard.  Respiratory: No rhonchi or crepitations.  Abdomen: Soft nontender bowel sounds present. No guarding or rigidity.  Skin: No rash.  Musculoskeletal: No edema.  Psychiatric: Appears normal.  Neurologic: Alert awake oriented to time place and person. Moves all extremities.  Labs on Admission:  Basic Metabolic Panel:  Recent Labs Lab 07/09/2013 2255  NA 141  K 5.0  CL 99  CO2 33*  GLUCOSE 122*  BUN 25*  CREATININE 1.90*  CALCIUM 9.7   Liver Function Tests:  Recent Labs Lab 07/13/2013  2255  AST 39*  ALT 19  ALKPHOS 85  BILITOT 0.8  PROT 6.6  ALBUMIN 3.5   No results found for this basename: LIPASE, AMYLASE,  in the last 168 hours No results found for this basename: AMMONIA,  in the last 168 hours CBC:  Recent Labs Lab 06/30/2013 2255  WBC 7.8  NEUTROABS 5.6  HGB 11.6*  HCT 39.7  MCV 86.1  PLT 155   Cardiac Enzymes:  Recent Labs Lab 07/13/2013 2255  TROPONINI <0.30    BNP (last 3 results)  Recent Labs  04/07/13 2210 05/12/13 1830 07/09/2013 2255  PROBNP 10489.0* 4282.0* 7044.0*   CBG: No results found for this basename: GLUCAP,  in the last 168 hours  Radiological Exams on Admission: Dg Chest 2 View  06/26/2013   CLINICAL DATA:  Shortness of breath; vomiting.  EXAM: CHEST  2 VIEW  COMPARISON:  Chest radiograph performed 05/12/2013  FINDINGS: A moderate right-sided pleural effusion is noted, increased in size from the prior study. Associated airspace opacification may reflect atelectasis or possibly pneumonia. Underlying vascular congestion is seen, and findings could reflect mildly asymmetric pulmonary edema. No pneumothorax is seen.  The heart remains significantly enlarged. No acute osseous abnormalities are seen.  IMPRESSION: 1. Moderate right-sided  pleural effusion has increased in size from the prior study; associated airspace opacification may reflect atelectasis or possibly pneumonia. Underlying vascular congestion seen, raising concern for mildly asymmetric pulmonary edema. 2. Significant cardiomegaly again noted.   Electronically Signed   By: Roanna Raider M.D.   On: 06/26/2013 01:26    EKG: Independently reviewed. Atrial fibrillation with rate control.  Assessment/Plan Principal Problem:   CHF (congestive heart failure) Active Problems:   Nausea with vomiting   HCAP (healthcare-associated pneumonia)   Pleural effusion   Acute renal failure   1. Shortness of breath - probably due to worsening right pleural effusion with CHF and possible health care associated pneumonia. Patient has been placed on Lasix and antibiotics for possible pneumonia healthcare associated. Closely follow intake output and metabolic panel. If shortness of breath does not improve patient may need thoracentesis for the pleural effusion. 2. Acute renal failure - probably precipitated by nausea and vomiting. Check urine analysis and urine sodium and creatinine. Closely follow intake output and metabolic panel. If creatinine worsens may have to hold Lasix. 3. Nausea and vomiting - abdomen appears benign. Patient has had previous biliary sludge for which we will order abdominal sonogram to check for gallbladder pathology. Check LFTs. 4. COPD - presently not wheezing continue inhalers. 5. Hypertension - continue home medications.    Code Status: Full code.  Family Communication: None.  Disposition Plan: Admit to inpatient.    Landra Howze N. [facility] Pager [phone].  If 7PM-7AM, please contact night-coverage [email or web address] Password [email or web address] 06/26/2013, 7:19 AM

## 2013-06-26 NOTE — Plan of Care (Cosign Needed)
Chart reviewed. Note mild progressive ARF in setting of progressive opacification/progression right pleural effusion- has underlying DHF with preserved LV function per ECHO June 2014. Given renal failure will dc Lasix until MD can evaluate later today.May need pulmonary eval for possible thoracentesis- pt INR therapeutic  Junious Silk, ANP

## 2013-06-26 NOTE — ED Notes (Signed)
MD at bedside discussing test results and plan to admit pt.  Pt agreeable with this plan.  Waiting now for edp to contact hospitalist for admit.

## 2013-06-26 NOTE — Progress Notes (Signed)
Clinical Social Work Department BRIEF PSYCHOSOCIAL ASSESSMENT 06/26/2013  Patient:  Joanna Salas, Joanna Salas     Account Number:  [id number]     Admit date:  07/10/2013  Clinical Social Worker:  Leron Croak, CLINICAL SOCIAL WORKER  Date/Time:  06/26/2013 11:38 AM  Referred by:  Physician  Date Referred:  06/26/2013 Referred for  SNF Placement   Other Referral:   Interview type:  Patient Other interview type:    PSYCHOSOCIAL DATA Living Status:  ALONE Admitted from facility:   Level of care:   Primary support name:  [name] [phone] Primary support relationship to patient:  CHILD, ADULT Degree of support available:   Pt has a good support system from her daughter.    CURRENT CONCERNS Current Concerns  Post-Acute Placement   Other Concerns:    SOCIAL WORK ASSESSMENT / PLAN BSW student and CSW met with Pt at bedside to offer d/c planning support. BSW student and CSW introduced themselves to the Pt and informed the Pt they were to offer Pt support for d/c planning. Pt declines SNF states that "she does not want any home health or rehab at the time". Pt states that "she had a fall months ago and just finished rehab. Pt stayed with her daughter after her fall and was able to go back home when she felt better.    BSW student and CSW informed Pt about care she can receive from SNF or home health care but Pt still declines.    Pt stated she is able to complete ADL's and can drive herself to appointments, Pt also has a good support system from her daughter (that lives next door to her).    BSW student will follow up of Pt.   Assessment/plan status:  Information/Referral to [facility] Other assessment/ plan:   Information/referral to community resources:   No addictional resources  needed at this time.    PATIENT'S/FAMILY'S RESPONSE TO PLAN OF CARE: Pt was very appreciative of services given.      Joanna Salas Social Work Programmer, systems #: [phone]    Leron Croak LCSWA  [facility]

## 2013-06-26 NOTE — Care Management Note (Signed)
    Page 1 of 1   06/26/2013     11:25:12 AM   CARE MANAGEMENT NOTE 06/26/2013  Patient:  Joanna Salas, Joanna Salas   Account Number:  [id number]  Date Initiated:  06/26/2013  Documentation initiated by:  Donn Pierini  Subjective/Objective Assessment:   Pt admitted with CHF, ARF, ?PNA, pl. effusion     Action/Plan:   PTA pt lived at home- daughter lives next door- has recently finished rehab and [facility] services. At this time pt does not want to return to SNF and does not feel like she needs cont. HH services. NCM to follow for potential d/c needs.   Anticipated DC Date:  06/30/2013   Anticipated DC Plan:        DC Planning Services  CM consult      Choice offered to / List presented to:             Status of service:  In process, will continue to follow Medicare Important Message given?   (If response is "NO", the following Medicare IM given date fields will be blank) Date Medicare IM given:   Date Additional Medicare IM given:    Discharge Disposition:    Per UR Regulation:  Reviewed for med. necessity/level of care/duration of stay  If discussed at Long Length of Stay Meetings, dates discussed:    Comments:

## 2013-06-27 LAB — CBC
HCT: 35.4 % — ABNORMAL LOW (ref 36.0–46.0)
Hemoglobin: 10.4 g/dL — ABNORMAL LOW (ref 12.0–15.0)
MCV: 85.5 fL (ref 78.0–100.0)
RBC: 4.14 MIL/uL (ref 3.87–5.11)
RDW: 17.9 % — ABNORMAL HIGH (ref 11.5–15.5)
WBC: 6.6 10*3/uL (ref 4.0–10.5)

## 2013-06-27 LAB — BASIC METABOLIC PANEL
CO2: 32 mEq/L (ref 19–32)
Chloride: 101 mEq/L (ref 96–112)
GFR calc Af Amer: 30 mL/min — ABNORMAL LOW (ref >90)
Glucose, Bld: 92 mg/dL (ref 70–99)
Potassium: 5.1 mEq/L (ref 3.5–5.1)
Sodium: 139 mEq/L (ref 135–145)

## 2013-06-27 LAB — URINE CULTURE

## 2013-06-27 LAB — PROTIME-INR: INR: 3.32 — ABNORMAL HIGH (ref 0.00–1.49)

## 2013-06-27 MED ORDER — ALPRAZOLAM 0.25 MG PO TABS
0.2500 mg | ORAL_TABLET | Freq: Three times a day (TID) | ORAL | Status: DC | PRN
  Administered 2013-06-27: 0.25 mg via ORAL
  Filled 2013-06-27: qty 1

## 2013-06-27 MED ORDER — PROMETHAZINE HCL 25 MG/ML IJ SOLN
12.5000 mg | Freq: Three times a day (TID) | INTRAMUSCULAR | Status: DC | PRN
  Administered 2013-06-27: 12.5 mg via INTRAVENOUS
  Filled 2013-06-27: qty 1

## 2013-06-30 ENCOUNTER — Ambulatory Visit: Payer: Medicare Other | Admitting: Family Medicine

## 2013-07-04 ENCOUNTER — Ambulatory Visit: Payer: Medicare Other | Admitting: Physician Assistant

## 2013-07-14 NOTE — Progress Notes (Signed)
CODE BLUE NOTE  Patient Name: Joanna Salas   MRN: [id number]   Date of Birth/ Sex: Jul 21, 1925 , female      Admission Date: Jul 03, 2013  Attending Provider: Lonia Blood, MD  Primary Diagnosis: CHF (congestive heart failure) [428.0] Acute renal failure [584.9]    Indication: Pt was in her usual state of health until this PM, when she was noted to be unresponsive and bradycardic and intermittently pulseless. Code blue was subsequently called. At the time of arrival on scene, ACLS protocol was underway, and no medications had yet been administered. Rhythm on the monitor became asystole. During ACLS interventions, the patient's daughter stated the patient's wishes were that the code in progress be stopped. All efforts were terminated. The patient had intermittent ventricular contractions with palpable femoral pulse, and regained a pulse in the 80s, with agonal breathing. Family and primary MD were at the bedside as the patient expired.    Technical Description:  - CPR performance duration:  Approximately 10  minutes  - Was defibrillation or cardioversion used? No   - Was external pacer placed? Yes  - Was patient intubated pre/post CPR? No    Medications Administered: Y = Yes; Blank = No Amiodarone    Atropine    Calcium    Epinephrine  Y  Lidocaine    Magnesium    Norepinephrine    Phenylephrine    Sodium bicarbonate  Y  Vasopressin    Other     Post CPR evaluation:  - Final Status - Was patient successfully resuscitated ? No   Miscellaneous Information:  - Time of death:  14:15   - Primary team notified?  Yes  - Family Notified? Yes        Hazeline Junker, MD   06/21/2013, 6:09 PM

## 2013-07-14 NOTE — Progress Notes (Signed)
Physician notified: Sharon Seller At: [phone]  Regarding: CODE BLUE 3S09. CALL ASAP  Awaiting return response.   Repaged.

## 2013-07-14 NOTE — Progress Notes (Addendum)
Pt CODE stopped by family, pt deceased at 79, verified by MD attending, family at [facility], support given by nursing staff and chaplain, [facility] Notified, pt dentures placed in mouth

## 2013-07-14 NOTE — Progress Notes (Signed)
ANTICOAGULATION CONSULT NOTE - Follow Up Consult  Pharmacy Consult for coumadin Indication: atrial fibrillation  Allergies  Allergen Reactions  . Pneumococcal Vaccines Swelling  . Atorvastatin Other (See Comments)    Severe Leg Cramps  . Ciprofloxacin Hcl Nausea And Vomiting  . Morphine Sulfate Nausea Only  . Penicillins Nausea And Vomiting  . Prednisone Other (See Comments) and Hypertension    Weight gain and md said shouldn't take with afib meds  . Sotalol Hcl Nausea And Vomiting  . Sulfonamide Derivatives Nausea Only    Patient Measurements: Height: 5\' 6"  (167.6 cm) Weight: 152 lb 1.9 oz (69 kg) IBW/kg (Calculated) : 59.3  Vital Signs: Temp: 98.2 F (36.8 C) (11/14 0800) Temp src: Oral (11/14 0800) BP: 114/69 mmHg (11/14 0800) Pulse Rate: 74 (11/14 0800)  Labs:  Recent Labs  06/16/2013 2255 06/26/13 0917 13-Jul-2013 0458  HGB 11.6* 11.4* 10.4*  HCT 39.7 39.2 35.4*  PLT 155 137* 134*  LABPROT 29.6*  --  32.5*  INR 2.94*  --  3.32*  CREATININE 1.90* 1.84* 1.73*  TROPONINI <0.30  --   --     Estimated Creatinine Clearance: 21.9 ml/min (by C-G formula based on Cr of 1.73).   Medications:  Scheduled:  . albuterol  2.5 mg Nebulization TID  . budesonide  0.5 mg Nebulization BID  . ceFEPime (MAXIPIME) IV  1 g Intravenous Q24H  . citalopram  10 mg Oral Daily  . famotidine  20 mg Oral Daily  . guaiFENesin  600 mg Oral BID  . ipratropium  0.5 mg Nebulization TID  . loratadine  10 mg Oral Daily  . metoprolol  75 mg Oral BID  . potassium chloride SA  20 mEq Oral Daily  . sodium chloride  3 mL Intravenous Q12H  . sodium chloride  3 mL Intravenous Q12H  . verapamil  240 mg Oral Daily  . Warfarin - Pharmacist Dosing Inpatient   Does not apply q1800    Assessment: 79 yo female admitted from home for dyspnea and vomiting. Patient on coumadin PTA and pharmacy has been consulted to dose. INR today is 3.32 and trend up possibly in setting of pleural effusion and  antibiotics.  home Coumadin dose: 2.5mg  MWFSa and 1.25mg  TTSu   Goal of Therapy:  INR 2-3 Monitor platelets by anticoagulation protocol: Yes   Plan:  -Hold coumadin today -Daily PT/INR  Harland German, Pharm D 07/13/2013 11:15 AM

## 2013-07-14 NOTE — Progress Notes (Signed)
Pt c/o being jettery and c/o of nausea, pt VSS, EKG done, MD/N, pt stated husband has put a spell on her and was going to die, family at [facility] updated to pt statement, pt OBS to be cont state nausea and unable to relax, Rapid Reponse RN called to assess further, nursing will cont to monitor

## 2013-07-14 NOTE — Discharge Summary (Signed)
Death Summary  Joanna Salas:[id number] DOB: [birth year] DOA: 07/04/13  PCP: Jeoffrey Massed, MD  Admit date: Jul 04, 2013 Date of Death: 07-06-2013  Final Diagnoses:    Sudden cardiac death - idiopathic    Acute on chronic diastolic CHF (congestive heart failure)   Nausea with vomiting   Possible HCAP (healthcare-associated pneumonia)   B Pleural effusions R>L   Acute renal failure on chronic kidney disease    Chronic atrial fibrillation on chronic coumadin tx    UTI   Aortic stensis - mild to moderate   History of present illness:  79 y/o female admitted from home for dyspnea and vomiting. Apparently had been short of breath for 2 days. Her daughter stated pt was sick with a lung infection a couple of weeks ago and took 4-5 days of antibiotics and appeared to improve. Pt had not been febrile lately. She had a nonproductive cough.   Hospital Course:  Patient was admitted to the acute units with her primary complaint being dyspnea. Workup was being carried out and had revealed right greater than left pleural effusions which were felt to be mild to moderate in size.  The patient was noted to be in atrial fibrillation, which was chronic, but her rate was well controlled.  She was therapeutic on Coumadin at the time of her admission and throughout her hospital stay.  The patient had no complaints of chest pain, no acute findings on her EKG, and a troponin was unrevealing.  There was concern that she was somewhat volume overloaded but aggressive diuresis was tempered by her chronic kidney disease.  On 07-06-2013 the patient was seen by the attending physician and reported that she actually felt much better.  In fact she asked to be discharged home.  She was quite disappointed when it was explained to her that she was not yet ready for discharge home.  The intention was to continue the medical workup to rule out the possibility of healthcare acquired pneumonia versus exacerbation of diastolic  congestive heart failure in the setting of chronic atrial fibrillation and aortic stenosis.  The attending physician was paged emergently in the afternoon hours of 06-Jul-2013 and informed the patient was being coded.  Fortunately the patient's daughter was present at the time of her acute decline and lovingly stated that the patient would not wish to undergo intubation or mechanical ventilation.  The nurses reported that the patient became unresponsive and suffered significant bradycardia.  She then rapidly declined into asystole.  At the time of my arrival initial resuscitative efforts have failed and the decision had been made to terminate the code.  The patient had at this time regained a heart rate, but her respirations were agonal and entirely ineffective.  The attending physician met with the patient's daughter at length at bedside and agreed that comfort focused care was the most appropriate.  All questions were answered.  While the attending physician was at the bedside the patient's respirations and heart rate ceased and she was declared dead at 14:45.  The cause of death will be labeled as sudden cardiac death, know the specific etiology is not clear.    SignedJetty Duhamel T  [facility] Jul 06, 2013, 5:07 PM

## 2013-07-14 NOTE — Progress Notes (Signed)
Physician notified: Sharon Seller At: 91  Regarding: CALL ASAP. Awaiting return response.   On unit at 1622

## 2013-07-14 NOTE — Progress Notes (Signed)
Chaplain responded to code blue. Chaplain waited with family while medical staff treated patient. Daughter expressed that her mother did not want this type of intervention and chaplain helped facilitate communication between staff and daughter. Medical staff stopped interventions. MD, RN, and chaplain sat with pt and her daughter as pt expired. Chaplain continued to provide emotional and spiritual support as the pt's daughter processed the loss with her cousin. Chp offered prayer.

## 2013-07-14 DEATH — deceased

## 2013-08-22 ENCOUNTER — Ambulatory Visit: Payer: Medicare Other | Admitting: Family Medicine

## 2013-09-23 ENCOUNTER — Ambulatory Visit: Payer: Medicare Other | Admitting: Family Medicine

## 2013-09-25 ENCOUNTER — Ambulatory Visit: Payer: Medicare Other | Admitting: Internal Medicine

## 2014-05-31 IMAGING — CT CT ANGIO CHEST
2 of 6 series · 17 of 46 positions shown · IV contrast (APPLIED)
Comparison: 01/19/2013 chest radiograph.

CLINICAL DATA: Short of breath with exertion.  No chest pain.
Pulmonary embolism.

CT ANGIOGRAPHY CHEST
TECHNIQUE: Multidetector CT imaging of the chest using the
standard protocol during bolus administration of intravenous
contrast. Multiplanar reconstructed images including MIPs were
obtained and reviewed to evaluate the vascular anatomy.
Contrast: 80mL OMNIPAQUE IOHEXOL 350 MG/ML SOLN

[Series 6: pulm embolism 1.0 b25f thin · axial · 0.68mm/px · z∈[-286,-78]mm · 14 of 228 slices shown]
[im 10/228  lung]
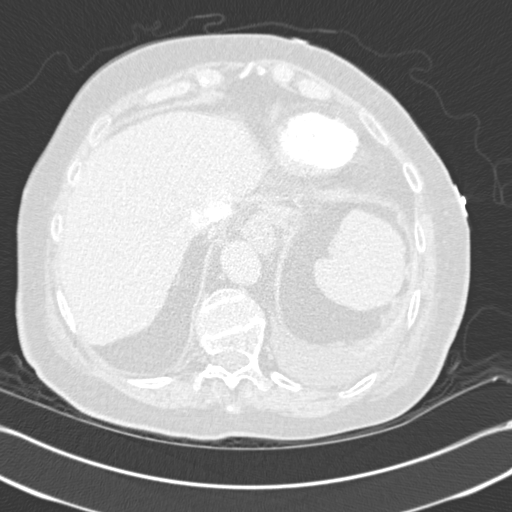
[im 30/228  soft-tissue]
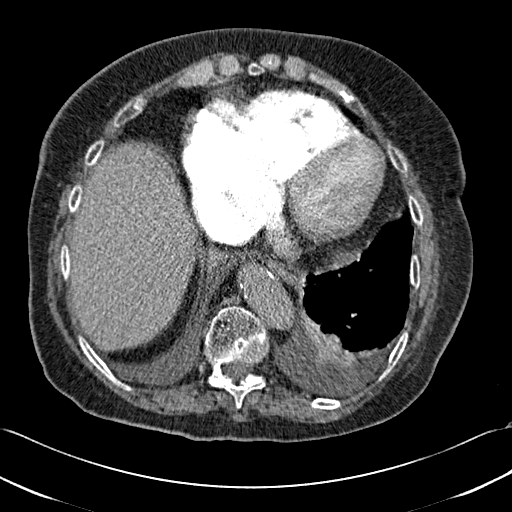
[im 40/228  lung]
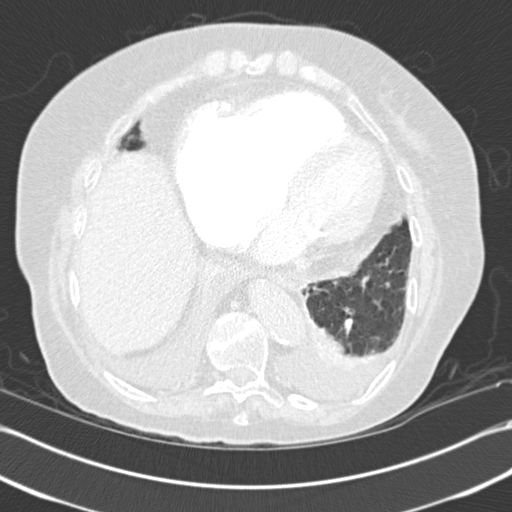
[im 60/228  soft-tissue]
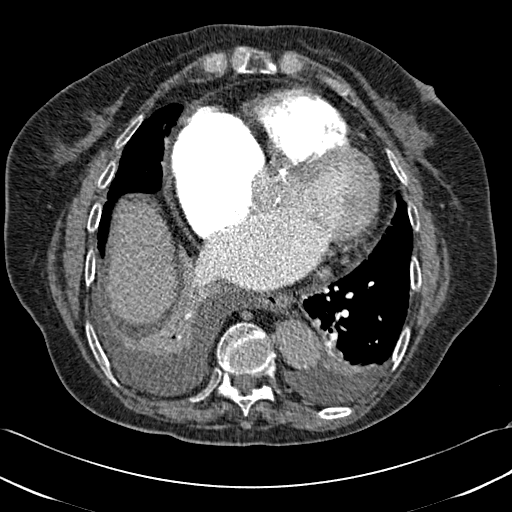
[im 79/228  lung]
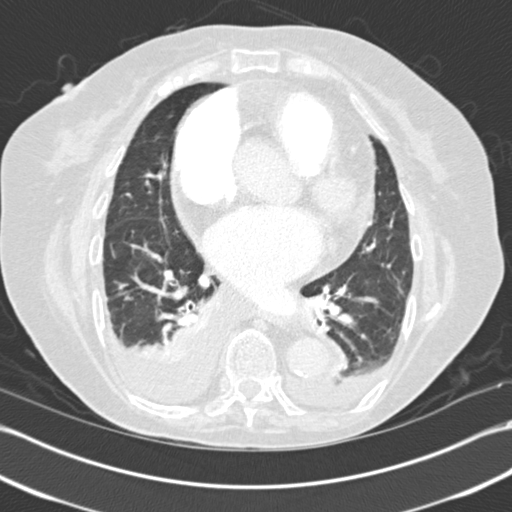
[im 89/228  soft-tissue]
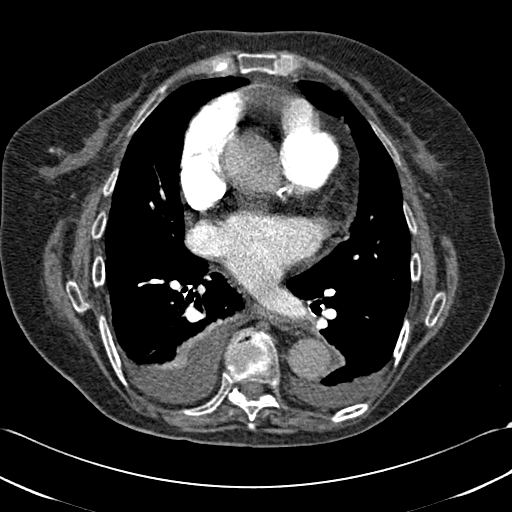
[im 109/228  lung]
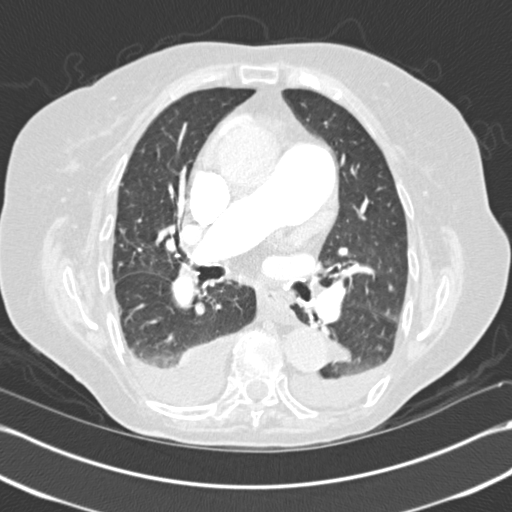
[im 119/228  soft-tissue]
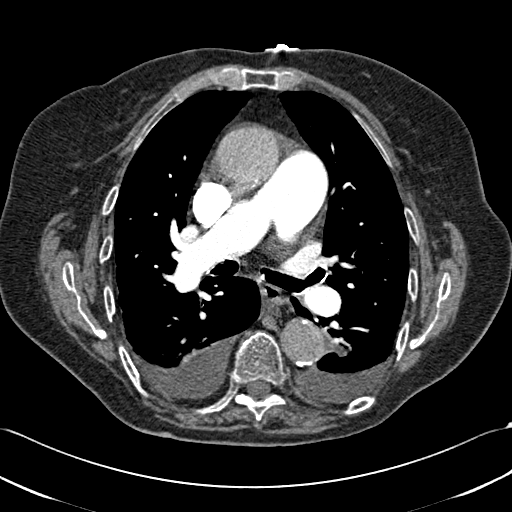
[im 139/228  lung]
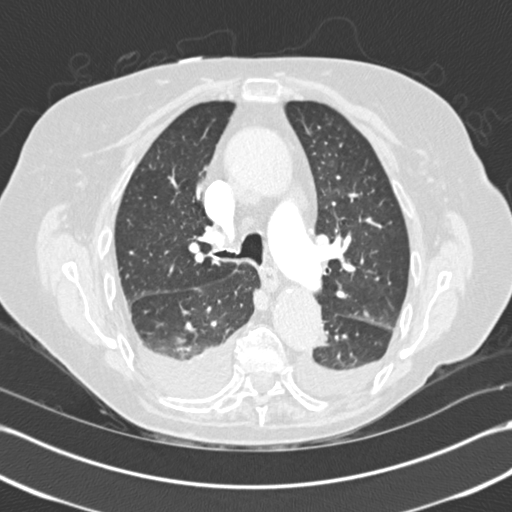
[im 149/228  soft-tissue]
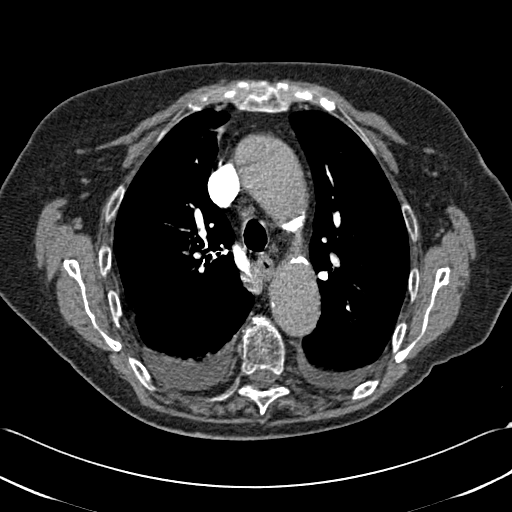
[im 168/228  lung]
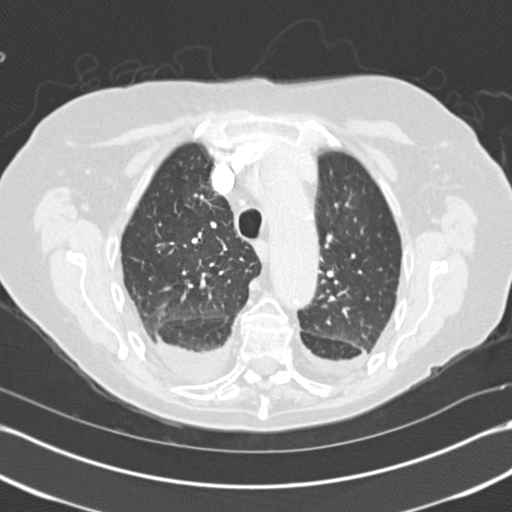
[im 188/228  soft-tissue]
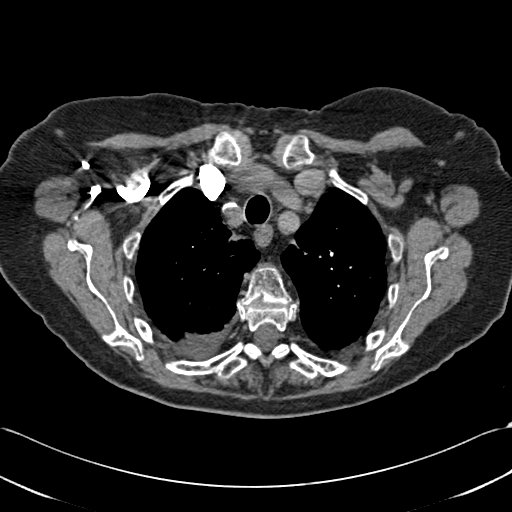
[im 198/228  lung]
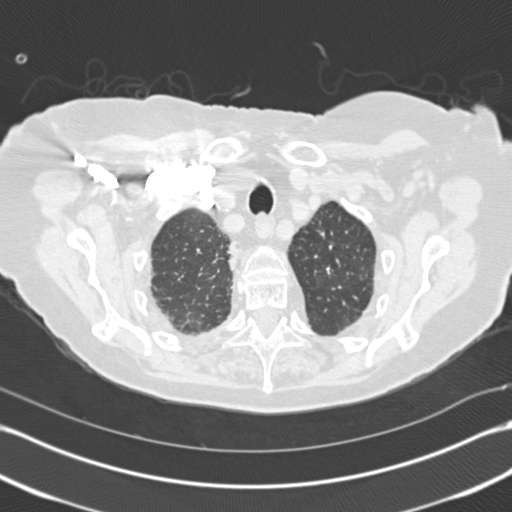
[im 218/228  soft-tissue]
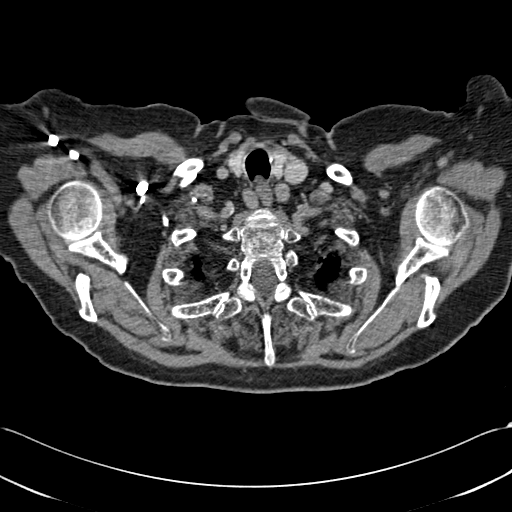

[Series 8: coronals · coronal · 0.49mm/px · 3 of 126 slices shown]
[im 32/126  soft-tissue]
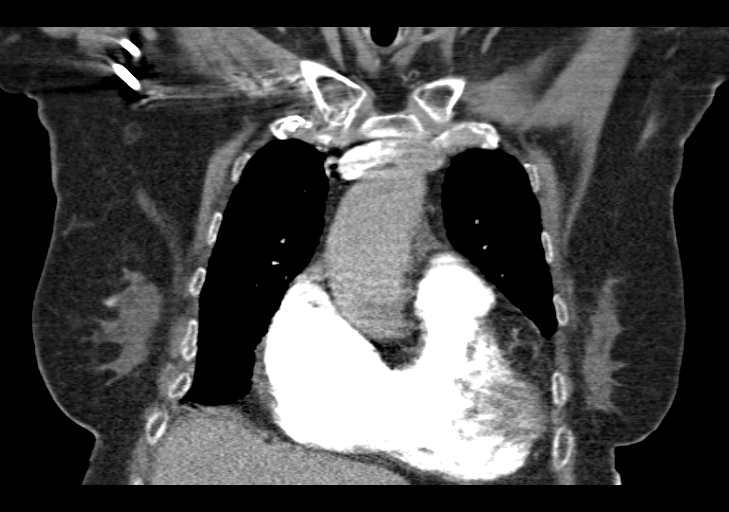
[im 63/126  soft-tissue]
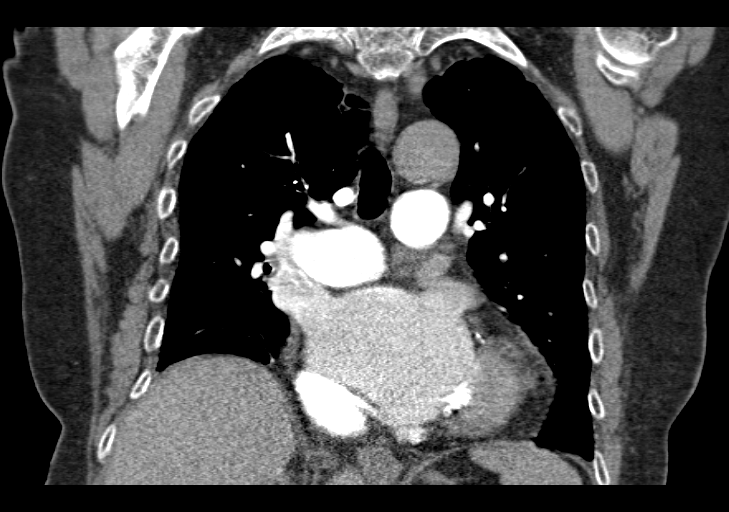
[im 94/126  soft-tissue]
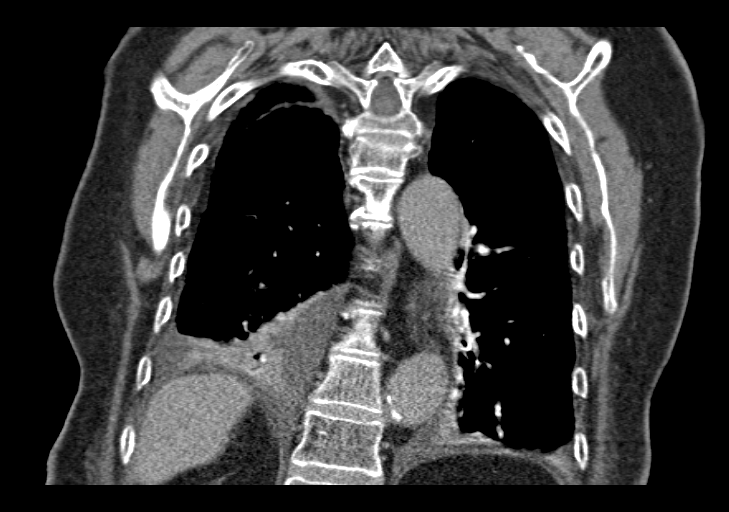

[17 of 46 positions shown; findings below may reference images not displayed]

FINDINGS: Technically excellent study for evaluation of pulmonary
embolus with standing opacification of the pulmonary arteries.
Some of this is due to low cardiac output state.  Cardiomegaly is
present.  Mitral annular calcification.  Aortic calcification is
present. Coronary artery atherosclerosis is present. If office
based assessment of coronary risk factors has not been performed,
it is now recommended.  The enlargement pulmonary artery may be due
to vascular congestion or pulmonary arterial hypertension.  Small
dependently layering bilateral pleural effusions are present.
There is no adenopathy.  In the aortic atherosclerosis.  No gross
acute aortic abnormality.  Borderline 40 mm ascending aortic
diameter.

Chronic thoracic compression fractures and exaggerated thoracic
kyphosis.  No axillary adenopathy.  No mediastinal or hilar
adenopathy. Lungs demonstrate dependent atelectasis.  Scattered
areas of scarring are also present.
IMPRESSION: 1.  Technically adequate study.  No pulmonary embolism.
2.  Cardiomegaly and small bilateral pleural effusions compatible
with failure.
3.  Borderline aneurysmal dilation of the ascending aorta at 40 mm.
No acute aortic abnormality identified allowing for phase of
contrast.

## 2014-05-31 IMAGING — CR DG CHEST 2V
2 series · 2 of 2 positions shown · non-contrast
Comparison: 01/11/2013 chest x-ray and thoracic spine MRI on
01/27/2005.

CLINICAL DATA: Lower extremity edema.  History of atrial
fibrillation.

CHEST - 2 VIEW

[w chest lat]
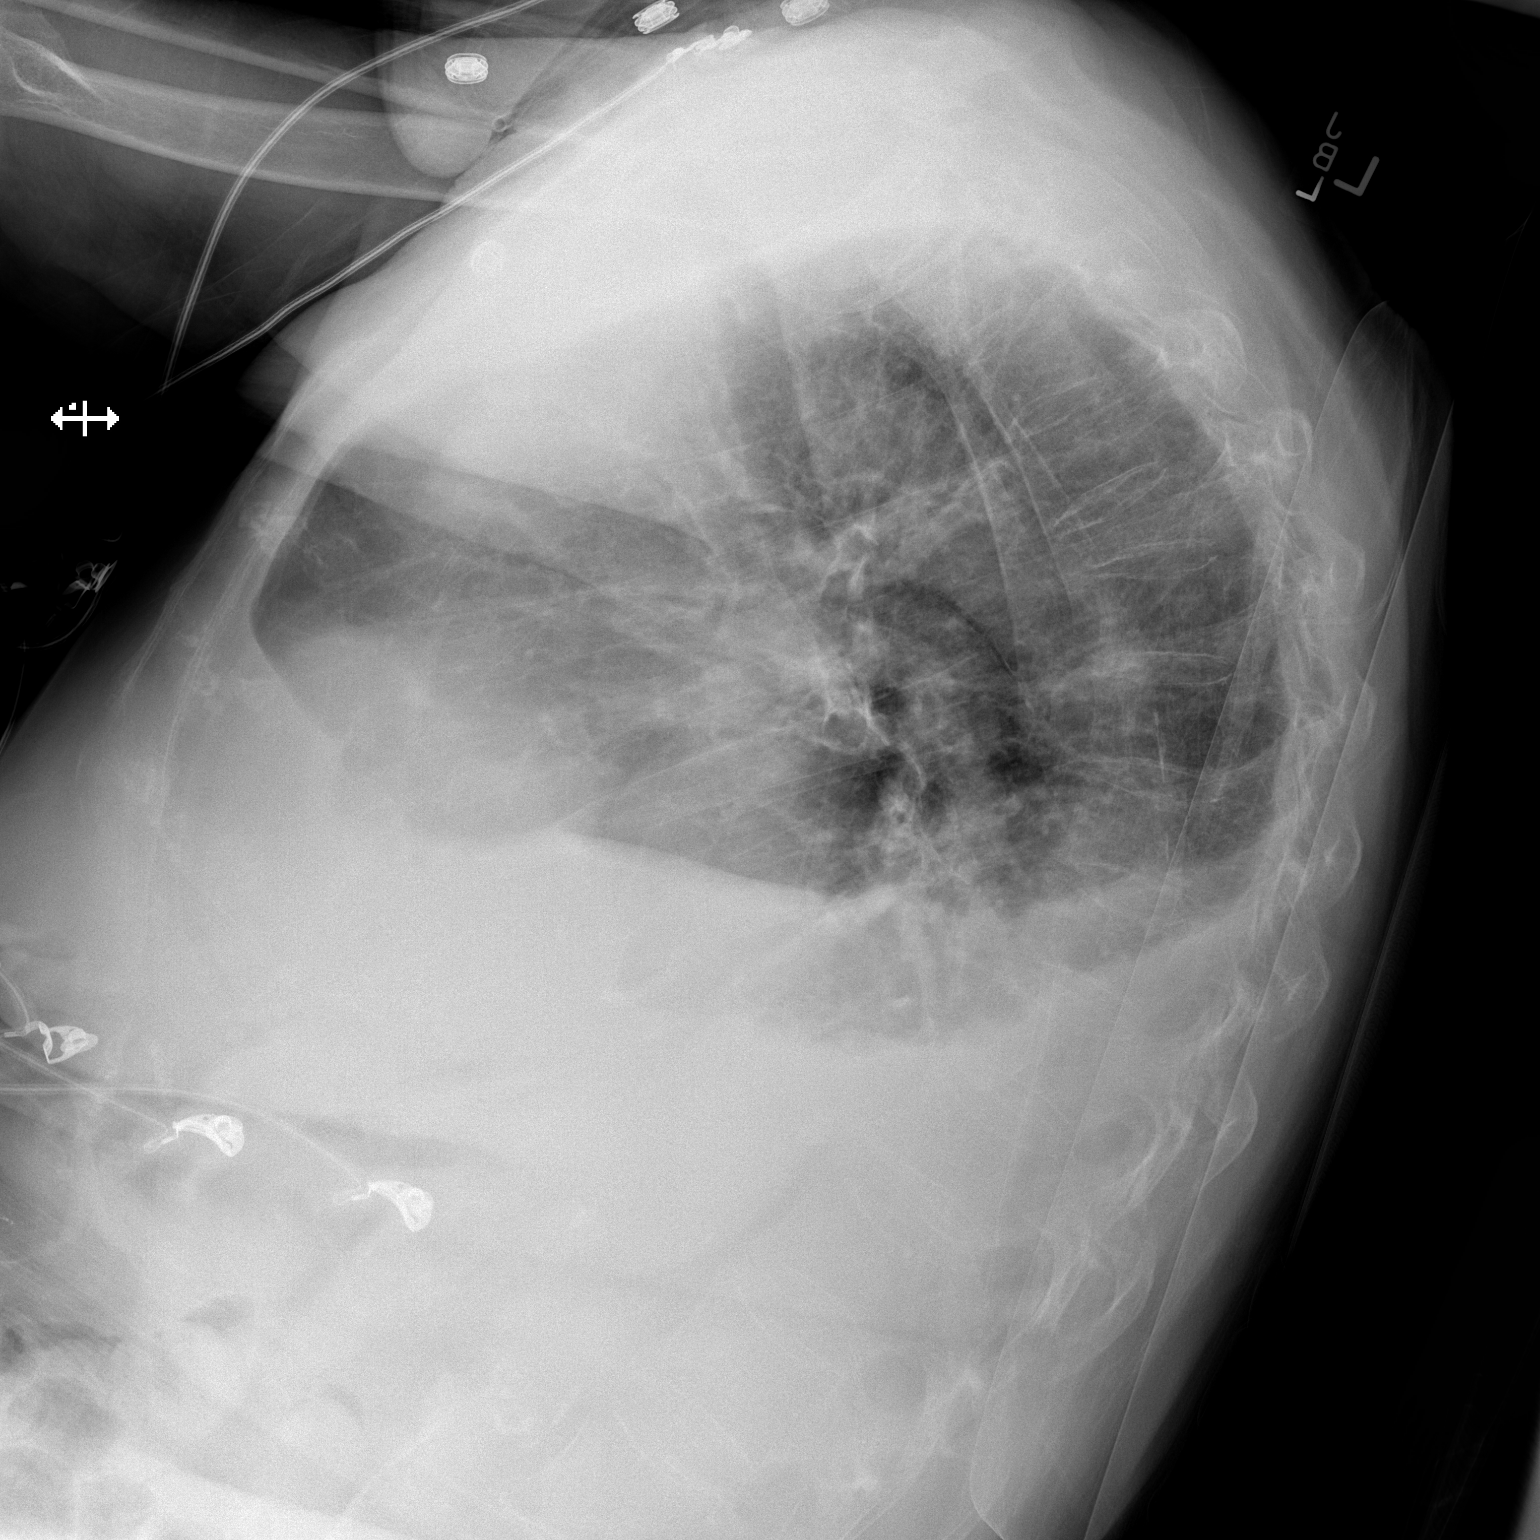

[x chest ap]
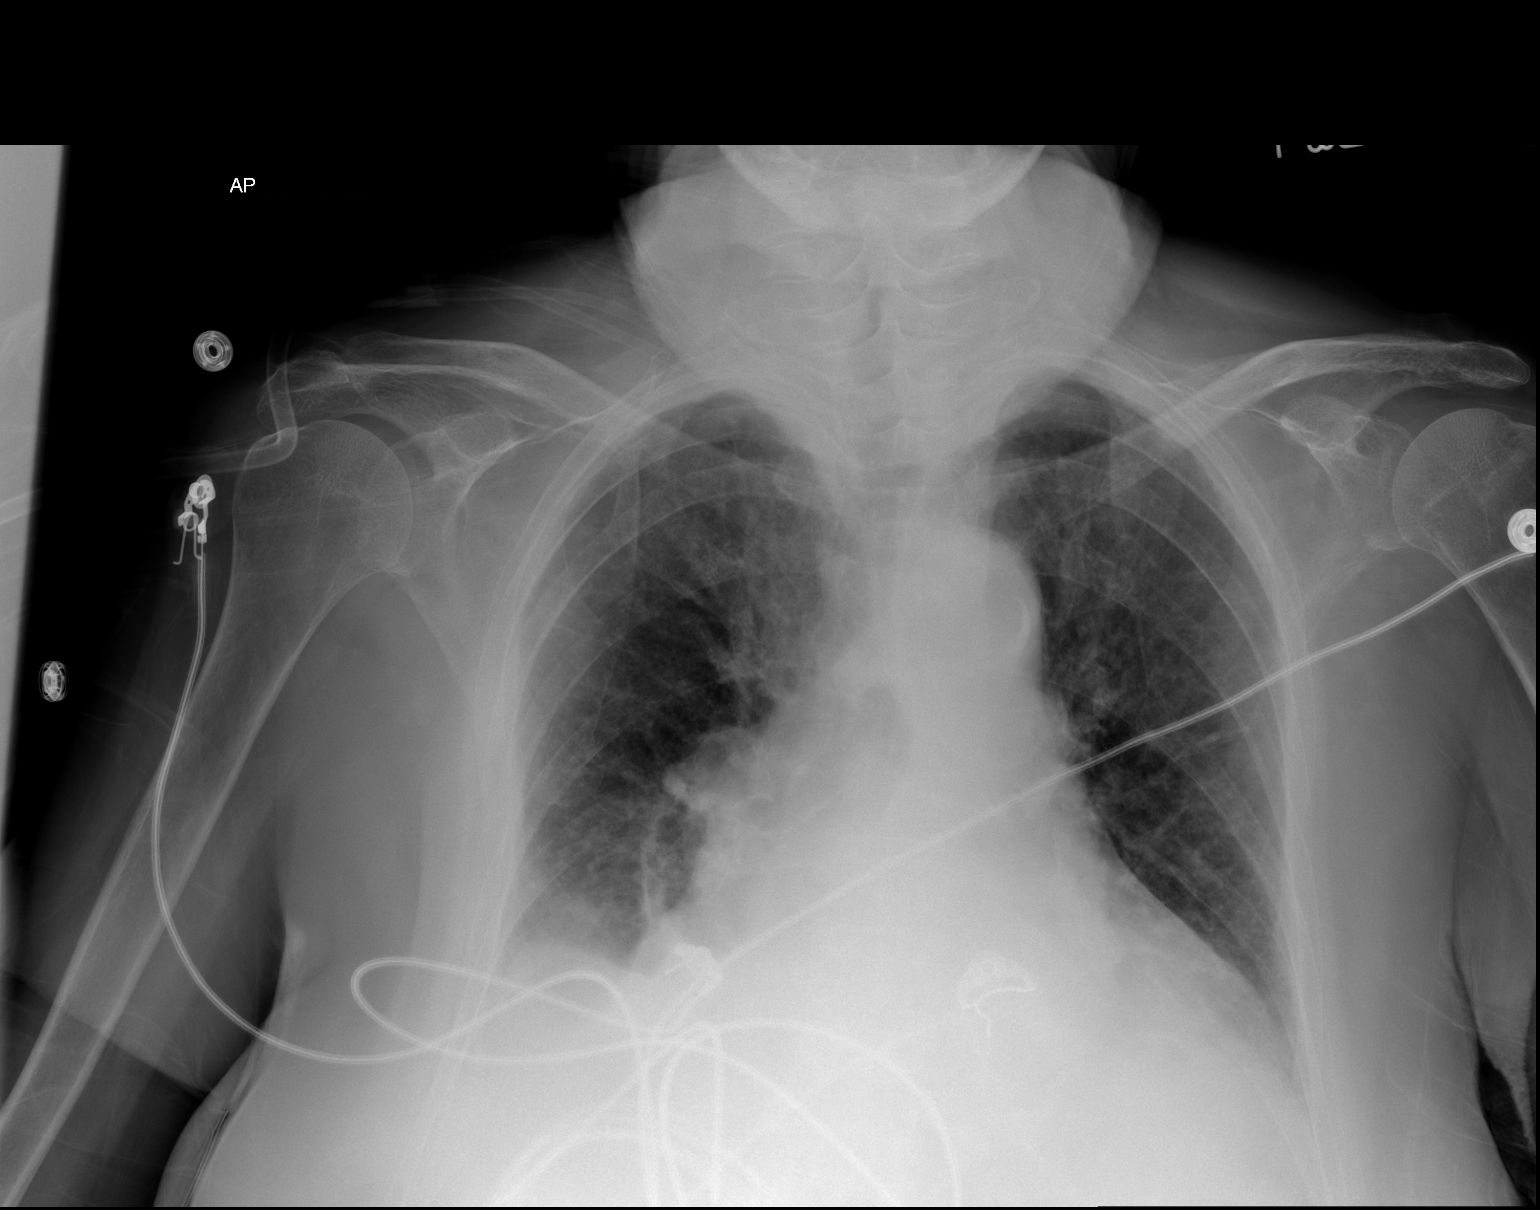

[2 of 2 positions shown; findings below may reference images not displayed]

FINDINGS: No overt pulmonary edema is identified.  Heart is
moderately enlarged and stable appearance.  There are small
bilateral pleural effusions.  Bony thorax shows osteopenia with
some loss of height of a mid thoracic vertebral body.  This has
been previously demonstrated by MRI of the thoracic spine to
represent a chronic compression fracture at the T5 level.
IMPRESSION: Cardiomegaly and small bilateral pleural effusions.  No overt
pulmonary edema.
